# Patient Record
Sex: Female | Born: 1945
Health system: Southern US, Community
[De-identification: ages and names within clinical notes are randomized; demographics above are authoritative.]

## PROBLEM LIST (undated history)

## (undated) DIAGNOSIS — E785 Hyperlipidemia, unspecified: Secondary | ICD-10-CM

## (undated) DIAGNOSIS — M1990 Unspecified osteoarthritis, unspecified site: Secondary | ICD-10-CM

## (undated) DIAGNOSIS — I10 Essential (primary) hypertension: Secondary | ICD-10-CM

## (undated) DIAGNOSIS — I4891 Unspecified atrial fibrillation: Secondary | ICD-10-CM

## (undated) HISTORY — DX: Hyperlipidemia, unspecified: E78.5

## (undated) HISTORY — DX: Unspecified osteoarthritis, unspecified site: M19.90

## (undated) HISTORY — DX: Unspecified atrial fibrillation: I48.91

## (undated) HISTORY — DX: Essential (primary) hypertension: I10

## (undated) HISTORY — PX: GALLBLADDER SURGERY: SHX652

---

## 1977-11-02 HISTORY — PX: BUNIONECTOMY: SHX129

## 1999-11-03 HISTORY — PX: ABDOMINAL HYSTERECTOMY: SHX81

## 2008-11-01 ENCOUNTER — Encounter: Admission: RE | Admit: 2008-11-01 | Discharge: 2008-11-01 | Payer: Self-pay | Admitting: Orthopedic Surgery

## 2008-11-02 HISTORY — PX: SPINE SURGERY: SHX786

## 2009-02-21 ENCOUNTER — Inpatient Hospital Stay (HOSPITAL_COMMUNITY): Admission: RE | Admit: 2009-02-21 | Discharge: 2009-02-23 | Payer: Self-pay | Admitting: Orthopedic Surgery

## 2010-01-06 ENCOUNTER — Encounter: Admission: RE | Admit: 2010-01-06 | Discharge: 2010-01-06 | Payer: Self-pay | Admitting: Neurosurgery

## 2010-11-23 ENCOUNTER — Encounter: Payer: Self-pay | Admitting: Orthopedic Surgery

## 2010-11-23 ENCOUNTER — Encounter: Payer: Self-pay | Admitting: Neurosurgery

## 2011-02-11 LAB — CBC
HCT: 35.7 % — ABNORMAL LOW (ref 36.0–46.0)
Hemoglobin: 12.1 g/dL (ref 12.0–15.0)
MCHC: 33.9 g/dL (ref 30.0–36.0)
Platelets: 215 10*3/uL (ref 150–400)
RDW: 15.4 % (ref 11.5–15.5)

## 2011-02-11 LAB — GLUCOSE, CAPILLARY
Glucose-Capillary: 146 mg/dL — ABNORMAL HIGH (ref 70–99)
Glucose-Capillary: 154 mg/dL — ABNORMAL HIGH (ref 70–99)
Glucose-Capillary: 155 mg/dL — ABNORMAL HIGH (ref 70–99)
Glucose-Capillary: 171 mg/dL — ABNORMAL HIGH (ref 70–99)
Glucose-Capillary: 177 mg/dL — ABNORMAL HIGH (ref 70–99)
Glucose-Capillary: 189 mg/dL — ABNORMAL HIGH (ref 70–99)

## 2011-02-11 LAB — TYPE AND SCREEN: Antibody Screen: NEGATIVE

## 2011-02-11 LAB — COMPREHENSIVE METABOLIC PANEL
ALT: 16 U/L (ref 0–35)
AST: 18 U/L (ref 0–37)
Albumin: 3.5 g/dL (ref 3.5–5.2)
CO2: 33 mEq/L — ABNORMAL HIGH (ref 19–32)
Calcium: 9 mg/dL (ref 8.4–10.5)
Chloride: 101 mEq/L (ref 96–112)
GFR calc non Af Amer: >60 mL/min (ref >60)
Sodium: 139 mEq/L (ref 135–145)
Total Protein: 6.4 g/dL (ref 6.0–8.3)

## 2011-02-11 LAB — DIFFERENTIAL
Basophils Relative: 0 % (ref 0–1)
Monocytes Absolute: 0.8 10*3/uL (ref 0.1–1.0)
Monocytes Relative: 6 % (ref 3–12)

## 2011-02-11 LAB — URINALYSIS, ROUTINE W REFLEX MICROSCOPIC
Nitrite: NEGATIVE
Specific Gravity, Urine: 1.016 (ref 1.005–1.030)
Urobilinogen, UA: 1 mg/dL (ref 0.0–1.0)
pH: 5.5 (ref 5.0–8.0)

## 2011-03-17 NOTE — Op Note (Signed)
NAMESINCLAIR, ARRAZOLA             ACCOUNT NO.:  [id number]   MEDICAL RECORD NO.:  [id number]          PATIENT TYPE:  INP   LOCATION:  [place]                         FACILITY:  [facility]   PHYSICIAN:  Kipp Brood. Gioffre, M.D.DATE OF BIRTH:  07-26-1946   DATE OF PROCEDURE:  02/21/2009  DATE OF DISCHARGE:                               OPERATIVE REPORT   SURGEON:  Kipp Brood. Gladstone Lighter, M.D.   ASSISTANT:  Tarri Glenn, M.D.   PREOPERATIVE DIAGNOSES:  1. Spinal stenosis at L3-4.  2. Spinal stenosis at 4-5.  3. Spinal stenosis at L5-S1, more toward the left.  All her symptoms      were on the left.   OPERATION:  Decompressive lumbar laminectomies at three levels, at L3-4,  L4-5, L5-S1 with foraminotomies.   DESCRIPTION OF PROCEDURE:  Under general anesthesia a routine orthopedic  prep and drape of the back was carried out.  The patient was on a spinal  frame.  Two needles were placed in the back for localization purposes.  X-ray was taken.  Once we localized the area where we wanted to begin  our incision, the incision was made and extended proximally and  distally.  We then stripped the muscle from the lamina and spinous  process in the usual fashion.  Another x-ray was taken to verify our  position.  Following that, we then inserted the [facility] retractors  and we stripped the muscle from the lamina, cleaned the lamina and  identified our spinous processes and a third x-ray was taken.  We then  removed a portion of the spinous process of  L3.  I removed a small  portion of the spinous process of L3.  We then went down to L4 and L5  and removed the spinous processes.  We then went down and brought the  microscope in and completed our laminectomies.  We went up as far  proximally as necessary at L3-4 until we could easily pass a hockey-  stick proximally and distally, then our decompression was halted at that  area.  We then removed the ligamentum of flavum and exposed the dura.  We had  good freedom of motion to the dura.  We then advanced distally  with a microscope in place and thoroughly decompressed L4-5.  We freed  up the roots bilaterally.  She had foraminal stenosis as well as severe  stenosis in that area.  Once this was done, we advanced distally and we  went down and mainly concentrated on the left side for the L5-S1  laminectomy.  We did remove some of the lamina as well on the opposite  right side, until we had good freedom of the dura.  Note that each step  of the way we did utilize our hockey-stick to check the foramina, as  well as proximally and distally and made sure we had a good  decompression before we stopped in both directions.  I thoroughly  irrigated out the area.  We bone waxed some of the raw ends of the bone.  Thoroughly irrigated out the area, and we did take another x-ray with  the instruments in the various spaces.  We then inserted 10 mL of  FloSeal and then held a saline-soaked sponge over that for about 30  seconds.  Following that we loosely applied some thrombin-soaked Gelfoam  out into the lateral gutters.  We then closed the wound in layers in the  usual fashion with #1 Vicryl.  I did leave a small distal and  proximal deep portions of the wound open for drainage purposes, to avoid  any compression on the dura.  The subcu was closed with #0 Vicryl, skin  was closed with metal staples.  A sterile Neosporin dressing was  applied.  She had 1 gram of IV Ancef preoperatively.           ______________________________  Kipp Brood Gladstone Lighter, M.D.     RAG/MEDQ  D:  02/21/2009  T:  02/21/2009  Job:  [id number]   cc:   Tarri Glenn, M.D.  Fax: [phone]

## 2011-07-29 ENCOUNTER — Encounter (INDEPENDENT_AMBULATORY_CARE_PROVIDER_SITE_OTHER): Payer: Self-pay | Admitting: Surgery

## 2011-07-29 ENCOUNTER — Ambulatory Visit (INDEPENDENT_AMBULATORY_CARE_PROVIDER_SITE_OTHER): Payer: BC Managed Care – PPO | Admitting: Surgery

## 2011-07-29 VITALS — BP 128/74 | HR 72 | Temp 96.9°F | Resp 16 | Ht 67.0 in | Wt 225.5 lb

## 2011-07-29 DIAGNOSIS — D170 Benign lipomatous neoplasm of skin and subcutaneous tissue of head, face and neck: Secondary | ICD-10-CM

## 2011-07-29 DIAGNOSIS — D1739 Benign lipomatous neoplasm of skin and subcutaneous tissue of other sites: Secondary | ICD-10-CM

## 2011-07-29 NOTE — Patient Instructions (Signed)
Follow up if it increases in size or causes pain

## 2011-07-29 NOTE — Progress Notes (Signed)
Chief Complaint  Patient presents with  . Other    new pt eval of large lipoma on neck     HPI Tina Baker is a 65 y.o. female.   HPI Pt sent today at the request of Dr Delilah Shan due to a mass over the posterior left neck.  It has been present for many years.  It does not cause any pain.  No associated drainage or color change.  Past Medical History  Diagnosis Date  . Arthritis   . Diabetes mellitus   . Hyperlipidemia   . Hypertension     Past Surgical History  Procedure Date  . Abdominal hysterectomy 2001  . Spine surgery 2010    protruding disc     Family History  Problem Relation Age of Onset  . Cancer Mother 1    leukemia   . Heart disease Father 3    heart attack     Social History History  Substance Use Topics  . Smoking status: Never Smoker   . Smokeless tobacco: Never Used  . Alcohol Use: No    Allergies  Allergen Reactions  . Bextra (Valdecoxib)     Rash     Current Outpatient Prescriptions  Medication Sig Dispense Refill  . ALPRAZolam (XANAX) 0.5 MG tablet Take 0.5 mg by mouth at bedtime as needed.        Marland Kitchen aspirin 81 MG tablet Take 81 mg by mouth daily.        . clidinium-chlordiazePOXIDE (LIBRAX) 2.5-5 MG per capsule Take 1 capsule by mouth 3 (three) times daily as needed.        . Flaxseed, Linseed, (FLAXSEED OIL) 1000 MG CAPS Take by mouth daily.        . lansoprazole (PREVACID) 30 MG capsule Take 30 mg by mouth daily.        . metFORMIN (GLUCOPHAGE) 500 MG tablet Take 500 mg by mouth 2 (two) times daily with a meal.        . Multiple Vitamin (MULTIVITAMIN PO) Take by mouth daily.        Marland Kitchen olmesartan-hydrochlorothiazide (BENICAR HCT) 40-25 MG per tablet Take 1 tablet by mouth daily.        . saxagliptin HCl (ONGLYZA) 5 MG TABS tablet Take by mouth daily.        . sertraline (ZOLOFT) 50 MG tablet Take 50 mg by mouth daily.        . simvastatin (ZOCOR) 40 MG tablet Take 40 mg by mouth at bedtime.          Review of Systems Review of  Systems  Constitutional: Negative.   HENT: Negative.   Eyes: Negative.   Respiratory: Negative.   Cardiovascular: Negative.   Genitourinary: Negative.   Musculoskeletal: Positive for arthralgias.  Psychiatric/Behavioral: Negative.     Blood pressure 128/74, pulse 72, temperature 96.9 F (36.1 C), resp. rate 16, height _0  (1.702 m), weight 225 lb 8 oz (102.286 kg).  Physical Exam Physical Exam  Constitutional: She is oriented to person, place, and time. She appears well-developed and well-nourished.  HENT:  Head: Normocephalic and atraumatic.  Eyes: EOM are normal. Pupils are equal, round, and reactive to light.  Neck: Normal range of motion. Neck supple.       4 cm mass posterior left neck at skull base mobile and nontender lipoma  Neurological: She is alert and oriented to person, place, and time.  Skin: Skin is warm and dry.  Scalp sebaceous cyst x 2    Data Reviewed Note from 07/20/2101  Assessment    Lipoma posterior left neck asymptomatic    Plan    Observe       Tali Cleaves A. 07/29/2011, 4:59 PM

## 2011-12-17 ENCOUNTER — Encounter (INDEPENDENT_AMBULATORY_CARE_PROVIDER_SITE_OTHER): Payer: Self-pay | Admitting: Surgery

## 2012-02-23 ENCOUNTER — Other Ambulatory Visit: Payer: Self-pay | Admitting: Sports Medicine

## 2012-02-23 DIAGNOSIS — M545 Low back pain: Secondary | ICD-10-CM

## 2012-02-26 ENCOUNTER — Other Ambulatory Visit: Payer: Self-pay | Admitting: Sports Medicine

## 2012-02-26 DIAGNOSIS — M545 Low back pain: Secondary | ICD-10-CM

## 2012-03-01 ENCOUNTER — Ambulatory Visit
Admission: RE | Admit: 2012-03-01 | Discharge: 2012-03-01 | Disposition: A | Discharge status: Home / Self Care | Payer: BC Managed Care – PPO | Source: Ambulatory Visit | Attending: Sports Medicine | Admitting: Sports Medicine

## 2012-03-01 ENCOUNTER — Other Ambulatory Visit: Payer: Self-pay

## 2012-03-01 DIAGNOSIS — M545 Low back pain: Secondary | ICD-10-CM

## 2012-03-01 MED ORDER — GADOBENATE DIMEGLUMINE 529 MG/ML IV SOLN
20.0000 mL | Freq: Once | INTRAVENOUS | Status: AC | PRN
  Administered 2012-03-01: 20 mL via INTRAVENOUS

## 2012-11-03 DIAGNOSIS — M4316 Spondylolisthesis, lumbar region: Secondary | ICD-10-CM | POA: Insufficient documentation

## 2014-12-11 ENCOUNTER — Other Ambulatory Visit: Payer: Self-pay | Admitting: *Deleted

## 2014-12-11 DIAGNOSIS — I872 Venous insufficiency (chronic) (peripheral): Secondary | ICD-10-CM

## 2015-01-08 ENCOUNTER — Encounter: Payer: Self-pay | Admitting: Vascular Surgery

## 2015-01-09 ENCOUNTER — Encounter: Payer: Self-pay | Admitting: Vascular Surgery

## 2015-01-09 ENCOUNTER — Ambulatory Visit (HOSPITAL_COMMUNITY)
Admission: RE | Admit: 2015-01-09 | Discharge: 2015-01-09 | Disposition: A | Discharge status: Home / Self Care | Payer: Medicare HMO | Source: Ambulatory Visit | Attending: Vascular Surgery | Admitting: Vascular Surgery

## 2015-01-09 ENCOUNTER — Ambulatory Visit (INDEPENDENT_AMBULATORY_CARE_PROVIDER_SITE_OTHER): Payer: Medicare HMO | Admitting: Vascular Surgery

## 2015-01-09 VITALS — BP 132/59 | HR 76 | Temp 98.1°F | Resp 16 | Ht 66.5 in | Wt 219.0 lb

## 2015-01-09 DIAGNOSIS — I83893 Varicose veins of bilateral lower extremities with other complications: Secondary | ICD-10-CM

## 2015-01-09 DIAGNOSIS — I872 Venous insufficiency (chronic) (peripheral): Secondary | ICD-10-CM

## 2015-01-09 NOTE — Progress Notes (Signed)
[facility] [place]  Patient name: Tina Baker MRN: [id number] DOB: 21-Aug-1946 Sex: female  REASON FOR CONSULT: Chronic venous insufficiency. Referred by Dr.McKinney  HPI: Tina Baker is a 69 y.o. female who was referred with chronic venous insufficiency. She describes aching pain in her legs however this generally occurs before she wakes up in the morning and is not necessarily aggravated by standing or relieved with elevation. She also states that she has problems with restless leg syndrome and and she has some leg pain associated with this. I do not get any history of claudication or rest pain. She has no history of nonhealing ulcers.  She is unaware of any history of DVT or phlebitis in the past.  I do have some old studies back in April 2013 which showed triphasic Doppler signals in the dorsalis pedis and posterior tibial positions bilaterally with normal ABIs bilaterally.   Past Medical History  Diagnosis Date  . Arthritis   . Diabetes mellitus   . Hyperlipidemia   . Hypertension    Family History  Problem Relation Age of Onset  . Cancer Mother 63    leukemia   . Heart disease Father 76    heart attack   . Heart disease Sister    SOCIAL HISTORY: History  Substance Use Topics  . Smoking status: Never Smoker   . Smokeless tobacco: Never Used  . Alcohol Use: No   Allergies  Allergen Reactions  . Bextra [Valdecoxib] Rash    Rash    Current Outpatient Prescriptions  Medication Sig Dispense Refill  . ALPRAZolam (XANAX) 0.5 MG tablet Take 0.5 mg by mouth at bedtime as needed.      Marland Kitchen aspirin 81 MG tablet Take 81 mg by mouth daily.      . clidinium-chlordiazePOXIDE (LIBRAX) 2.5-5 MG per capsule Take 1 capsule by mouth 3 (three) times daily as needed.      Marland Kitchen glimepiride (AMARYL) 2 MG tablet Take 2 mg by mouth daily with breakfast.    . indapamide (LOZOL) 2.5 MG tablet Take 2.5 mg by mouth daily.    . lansoprazole (PREVACID) 30 MG capsule  Take 30 mg by mouth daily.      Marland Kitchen losartan (COZAAR) 100 MG tablet Take 100 mg by mouth daily.    . metFORMIN (GLUCOPHAGE) 500 MG tablet Take 500 mg by mouth 2 (two) times daily with a meal.      . pravastatin (PRAVACHOL) 40 MG tablet Take 40 mg by mouth daily.    Marland Kitchen rOPINIRole (REQUIP) 0.5 MG tablet Take 0.5 mg by mouth 3 (three) times daily.    . saxagliptin HCl (ONGLYZA) 5 MG TABS tablet Take by mouth daily.      . sertraline (ZOLOFT) 50 MG tablet Take 100 mg by mouth daily.     . Flaxseed, Linseed, (FLAXSEED OIL) 1000 MG CAPS Take by mouth daily.      . Multiple Vitamin (MULTIVITAMIN PO) Take by mouth daily.      Marland Kitchen olmesartan-hydrochlorothiazide (BENICAR HCT) 40-25 MG per tablet Take 1 tablet by mouth daily.      . simvastatin (ZOCOR) 40 MG tablet Take 40 mg by mouth at bedtime.       No current facility-administered medications for this visit.   REVIEW OF SYSTEMS: Valu.Nieves ] denotes positive finding; [  ] denotes negative finding  CARDIOVASCULAR:  _0  chest pain   _1  chest pressure   _2  palpitations   _3   orthopnea   _0  dyspnea on exertion   _1  claudication   _2  rest pain   _3  DVT   _4  phlebitis PULMONARY:   _5  productive cough   _6  asthma   _7  wheezing NEUROLOGIC:   Valu.Nieves ] weakness  _8  paresthesias  _9  aphasia  _10  amaurosis  _11  dizziness HEMATOLOGIC:   _12  bleeding problems   _13  clotting disorders MUSCULOSKELETAL:  _14  joint pain   _15  joint swelling _16  leg swelling GASTROINTESTINAL: _17   blood in stool  _18   hematemesis GENITOURINARY:  _19   dysuria  _20   hematuria PSYCHIATRIC:  _21  history of major depression INTEGUMENTARY:  _22  rashes  _23  ulcers CONSTITUTIONAL:  _24  fever   _25  chills  PHYSICAL EXAM: Filed Vitals:   01/09/15 1303  BP: 132/59  Pulse: 76  Temp: 98.1 F (36.7 C)  TempSrc: Oral  Resp: 16  Height: 5' 6.5" (1.689 m)  Weight: 219 lb (99.338 kg)  SpO2: 100%   Body mass index is 34.82 kg/(m^2). GENERAL: The patient is a well-nourished female, in no  acute distress. The vital signs are documented above. CARDIOVASCULAR: There is a regular rate and rhythm. I do not detect carotid bruits. She has palpable popliteal, dorsalis pedis, and posterior tibial pulses bilaterally. She has mild bilateral lower extremity swelling. PULMONARY: There is good air exchange bilaterally without wheezing or rales. ABDOMEN: Soft and non-tender with normal pitched bowel sounds.  MUSCULOSKELETAL: There are no major deformities or cyanosis. NEUROLOGIC: No focal weakness or paresthesias are detected. SKIN: She has some large truncal varicosities along the posterior aspect of her distal left thigh. She has telangiectasias in the thighs and lower legs bilaterally both anteriorly and posteriorly. PSYCHIATRIC: The patient has a normal affect.  DATA:  I have independently interpreted her venous duplex scan. On the left side, there is no evidence of DVT. There is no deep vein reflux there is significant reflux in the left greater saphenous vein.  On the right side, there is no evidence of DVT. There is no deep vein reflux on the right. There is only a short segment of reflux in the small saphenous vein.  MEDICAL ISSUES: BILATERAL LOWER EXTREMITY VARICOSE VEINS AND CHRONIC VENOUS INSUFFICIENCY: I am not convinced that her aching pain in her legs before she gets out of bed in the morning is related to her chronic venous insufficiency. I think this could potentially be related to her restless leg syndrome. I reassured her that she had no evidence of arterial insufficiency and her symptoms were not consistent with this. She has palpable pedal pulses. Does have significant reflux in the left greater saphenous vein however her symptoms occur when she is in bed in the morning and so I'm not sure that this is the culprit. With respect to her venous disease, I have discussed the importance of intermittent leg elevation in the proper positioning for this. In addition I have written her  prescription for knee-high compression stockings with a gradient of 15-20 mmHg. I've encouraged her to exercise as much as possible especially walking. I have encouraged her to avoid prolonged sitting and standing. We also discussed the potential use of water aerobics which I think is also helpful for patients with venous disease. If her symptoms progress then we could try her in thigh-high stockings with a gradient of 20-30. I'll be happy to see  her back at any time if her symptoms progress or she develops worsening varicose veins.  [facility] [place] Beeper: [phone]

## 2015-06-25 DIAGNOSIS — E1165 Type 2 diabetes mellitus with hyperglycemia: Secondary | ICD-10-CM | POA: Insufficient documentation

## 2015-06-25 DIAGNOSIS — E119 Type 2 diabetes mellitus without complications: Secondary | ICD-10-CM | POA: Insufficient documentation

## 2015-06-26 DIAGNOSIS — D649 Anemia, unspecified: Secondary | ICD-10-CM | POA: Insufficient documentation

## 2015-06-27 DIAGNOSIS — D72829 Elevated white blood cell count, unspecified: Secondary | ICD-10-CM | POA: Insufficient documentation

## 2015-07-04 DIAGNOSIS — Z09 Encounter for follow-up examination after completed treatment for conditions other than malignant neoplasm: Secondary | ICD-10-CM | POA: Insufficient documentation

## 2015-07-18 DIAGNOSIS — R0789 Other chest pain: Secondary | ICD-10-CM | POA: Insufficient documentation

## 2015-09-30 DIAGNOSIS — R609 Edema, unspecified: Secondary | ICD-10-CM | POA: Insufficient documentation

## 2015-11-13 DIAGNOSIS — M48061 Spinal stenosis, lumbar region without neurogenic claudication: Secondary | ICD-10-CM | POA: Insufficient documentation

## 2015-11-13 DIAGNOSIS — J309 Allergic rhinitis, unspecified: Secondary | ICD-10-CM | POA: Insufficient documentation

## 2015-11-13 DIAGNOSIS — M5126 Other intervertebral disc displacement, lumbar region: Secondary | ICD-10-CM | POA: Insufficient documentation

## 2015-11-13 DIAGNOSIS — G2581 Restless legs syndrome: Secondary | ICD-10-CM | POA: Insufficient documentation

## 2015-11-13 DIAGNOSIS — M51379 Other intervertebral disc degeneration, lumbosacral region without mention of lumbar back pain or lower extremity pain: Secondary | ICD-10-CM | POA: Insufficient documentation

## 2015-11-13 DIAGNOSIS — E6609 Other obesity due to excess calories: Secondary | ICD-10-CM | POA: Insufficient documentation

## 2015-11-13 DIAGNOSIS — M6240 Contracture of muscle, unspecified site: Secondary | ICD-10-CM | POA: Insufficient documentation

## 2015-11-13 DIAGNOSIS — G4700 Insomnia, unspecified: Secondary | ICD-10-CM | POA: Insufficient documentation

## 2015-11-13 DIAGNOSIS — K219 Gastro-esophageal reflux disease without esophagitis: Secondary | ICD-10-CM | POA: Insufficient documentation

## 2015-11-13 DIAGNOSIS — E559 Vitamin D deficiency, unspecified: Secondary | ICD-10-CM | POA: Insufficient documentation

## 2015-11-13 DIAGNOSIS — I10 Essential (primary) hypertension: Secondary | ICD-10-CM | POA: Insufficient documentation

## 2015-11-13 DIAGNOSIS — M7580 Other shoulder lesions, unspecified shoulder: Secondary | ICD-10-CM | POA: Insufficient documentation

## 2015-11-13 DIAGNOSIS — G44229 Chronic tension-type headache, not intractable: Secondary | ICD-10-CM | POA: Insufficient documentation

## 2015-11-13 DIAGNOSIS — M109 Gout, unspecified: Secondary | ICD-10-CM | POA: Insufficient documentation

## 2015-11-13 DIAGNOSIS — M5137 Other intervertebral disc degeneration, lumbosacral region: Secondary | ICD-10-CM | POA: Insufficient documentation

## 2015-11-13 DIAGNOSIS — D500 Iron deficiency anemia secondary to blood loss (chronic): Secondary | ICD-10-CM | POA: Insufficient documentation

## 2015-11-13 DIAGNOSIS — I480 Paroxysmal atrial fibrillation: Secondary | ICD-10-CM | POA: Insufficient documentation

## 2015-11-18 DIAGNOSIS — I723 Aneurysm of iliac artery: Secondary | ICD-10-CM | POA: Diagnosis not present

## 2015-11-18 DIAGNOSIS — I724 Aneurysm of artery of lower extremity: Secondary | ICD-10-CM | POA: Diagnosis not present

## 2015-11-19 DIAGNOSIS — I10 Essential (primary) hypertension: Secondary | ICD-10-CM | POA: Diagnosis not present

## 2015-11-19 DIAGNOSIS — E119 Type 2 diabetes mellitus without complications: Secondary | ICD-10-CM | POA: Diagnosis not present

## 2015-11-20 DIAGNOSIS — H2513 Age-related nuclear cataract, bilateral: Secondary | ICD-10-CM | POA: Diagnosis not present

## 2015-11-20 DIAGNOSIS — H169 Unspecified keratitis: Secondary | ICD-10-CM | POA: Diagnosis not present

## 2015-11-20 DIAGNOSIS — I10 Essential (primary) hypertension: Secondary | ICD-10-CM | POA: Diagnosis not present

## 2015-11-20 DIAGNOSIS — E119 Type 2 diabetes mellitus without complications: Secondary | ICD-10-CM | POA: Diagnosis not present

## 2015-11-20 DIAGNOSIS — H1851 Endothelial corneal dystrophy: Secondary | ICD-10-CM | POA: Diagnosis not present

## 2015-11-26 DIAGNOSIS — L600 Ingrowing nail: Secondary | ICD-10-CM | POA: Diagnosis not present

## 2015-11-26 DIAGNOSIS — E1165 Type 2 diabetes mellitus with hyperglycemia: Secondary | ICD-10-CM | POA: Diagnosis not present

## 2015-12-02 DIAGNOSIS — I729 Aneurysm of unspecified site: Secondary | ICD-10-CM | POA: Diagnosis not present

## 2015-12-02 DIAGNOSIS — I723 Aneurysm of iliac artery: Secondary | ICD-10-CM | POA: Diagnosis not present

## 2015-12-23 DIAGNOSIS — Z1231 Encounter for screening mammogram for malignant neoplasm of breast: Secondary | ICD-10-CM | POA: Diagnosis not present

## 2015-12-31 DIAGNOSIS — M7551 Bursitis of right shoulder: Secondary | ICD-10-CM | POA: Diagnosis not present

## 2015-12-31 DIAGNOSIS — I10 Essential (primary) hypertension: Secondary | ICD-10-CM | POA: Diagnosis not present

## 2015-12-31 DIAGNOSIS — M7550 Bursitis of unspecified shoulder: Secondary | ICD-10-CM | POA: Insufficient documentation

## 2015-12-31 DIAGNOSIS — M7581 Other shoulder lesions, right shoulder: Secondary | ICD-10-CM | POA: Diagnosis not present

## 2015-12-31 DIAGNOSIS — J029 Acute pharyngitis, unspecified: Secondary | ICD-10-CM | POA: Diagnosis not present

## 2015-12-31 DIAGNOSIS — Z7901 Long term (current) use of anticoagulants: Secondary | ICD-10-CM | POA: Insufficient documentation

## 2015-12-31 DIAGNOSIS — M1990 Unspecified osteoarthritis, unspecified site: Secondary | ICD-10-CM | POA: Diagnosis not present

## 2015-12-31 DIAGNOSIS — M19019 Primary osteoarthritis, unspecified shoulder: Secondary | ICD-10-CM | POA: Insufficient documentation

## 2015-12-31 DIAGNOSIS — M25511 Pain in right shoulder: Secondary | ICD-10-CM | POA: Diagnosis not present

## 2016-01-13 DIAGNOSIS — Z1272 Encounter for screening for malignant neoplasm of vagina: Secondary | ICD-10-CM | POA: Diagnosis not present

## 2016-01-13 DIAGNOSIS — N898 Other specified noninflammatory disorders of vagina: Secondary | ICD-10-CM | POA: Diagnosis not present

## 2016-01-13 DIAGNOSIS — Z01419 Encounter for gynecological examination (general) (routine) without abnormal findings: Secondary | ICD-10-CM | POA: Diagnosis not present

## 2016-01-22 DIAGNOSIS — G2581 Restless legs syndrome: Secondary | ICD-10-CM | POA: Diagnosis not present

## 2016-01-28 DIAGNOSIS — R001 Bradycardia, unspecified: Secondary | ICD-10-CM | POA: Diagnosis not present

## 2016-01-28 DIAGNOSIS — I480 Paroxysmal atrial fibrillation: Secondary | ICD-10-CM | POA: Diagnosis not present

## 2016-01-29 DIAGNOSIS — R001 Bradycardia, unspecified: Secondary | ICD-10-CM | POA: Diagnosis not present

## 2016-01-29 DIAGNOSIS — I481 Persistent atrial fibrillation: Secondary | ICD-10-CM | POA: Diagnosis not present

## 2016-01-29 DIAGNOSIS — R42 Dizziness and giddiness: Secondary | ICD-10-CM | POA: Diagnosis not present

## 2016-01-31 DIAGNOSIS — R001 Bradycardia, unspecified: Secondary | ICD-10-CM | POA: Diagnosis not present

## 2016-01-31 DIAGNOSIS — I480 Paroxysmal atrial fibrillation: Secondary | ICD-10-CM | POA: Diagnosis not present

## 2016-02-04 DIAGNOSIS — R109 Unspecified abdominal pain: Secondary | ICD-10-CM | POA: Diagnosis not present

## 2016-02-04 DIAGNOSIS — R102 Pelvic and perineal pain: Secondary | ICD-10-CM | POA: Diagnosis not present

## 2016-02-06 DIAGNOSIS — B9629 Other Escherichia coli [E. coli] as the cause of diseases classified elsewhere: Secondary | ICD-10-CM | POA: Diagnosis not present

## 2016-02-06 DIAGNOSIS — R350 Frequency of micturition: Secondary | ICD-10-CM | POA: Diagnosis not present

## 2016-02-06 DIAGNOSIS — N3001 Acute cystitis with hematuria: Secondary | ICD-10-CM | POA: Diagnosis not present

## 2016-02-10 DIAGNOSIS — Z780 Asymptomatic menopausal state: Secondary | ICD-10-CM | POA: Diagnosis not present

## 2016-02-10 DIAGNOSIS — Z13820 Encounter for screening for osteoporosis: Secondary | ICD-10-CM | POA: Diagnosis not present

## 2016-02-10 DIAGNOSIS — Z01419 Encounter for gynecological examination (general) (routine) without abnormal findings: Secondary | ICD-10-CM | POA: Diagnosis not present

## 2016-03-17 DIAGNOSIS — Z7901 Long term (current) use of anticoagulants: Secondary | ICD-10-CM | POA: Diagnosis not present

## 2016-03-17 DIAGNOSIS — R001 Bradycardia, unspecified: Secondary | ICD-10-CM | POA: Diagnosis not present

## 2016-03-17 DIAGNOSIS — I480 Paroxysmal atrial fibrillation: Secondary | ICD-10-CM | POA: Diagnosis not present

## 2016-04-01 DIAGNOSIS — I10 Essential (primary) hypertension: Secondary | ICD-10-CM | POA: Diagnosis not present

## 2016-04-01 DIAGNOSIS — E119 Type 2 diabetes mellitus without complications: Secondary | ICD-10-CM | POA: Diagnosis not present

## 2016-04-01 DIAGNOSIS — R42 Dizziness and giddiness: Secondary | ICD-10-CM | POA: Diagnosis not present

## 2016-04-01 DIAGNOSIS — R110 Nausea: Secondary | ICD-10-CM | POA: Diagnosis not present

## 2016-04-27 DIAGNOSIS — E119 Type 2 diabetes mellitus without complications: Secondary | ICD-10-CM | POA: Diagnosis not present

## 2016-04-28 DIAGNOSIS — I10 Essential (primary) hypertension: Secondary | ICD-10-CM | POA: Diagnosis not present

## 2016-04-28 DIAGNOSIS — L821 Other seborrheic keratosis: Secondary | ICD-10-CM | POA: Diagnosis not present

## 2016-04-28 DIAGNOSIS — E119 Type 2 diabetes mellitus without complications: Secondary | ICD-10-CM | POA: Diagnosis not present

## 2016-06-24 DIAGNOSIS — J029 Acute pharyngitis, unspecified: Secondary | ICD-10-CM | POA: Diagnosis not present

## 2016-06-24 DIAGNOSIS — E119 Type 2 diabetes mellitus without complications: Secondary | ICD-10-CM | POA: Diagnosis not present

## 2016-06-24 DIAGNOSIS — H9203 Otalgia, bilateral: Secondary | ICD-10-CM | POA: Diagnosis not present

## 2016-06-24 DIAGNOSIS — J0100 Acute maxillary sinusitis, unspecified: Secondary | ICD-10-CM | POA: Diagnosis not present

## 2016-06-24 DIAGNOSIS — I10 Essential (primary) hypertension: Secondary | ICD-10-CM | POA: Diagnosis not present

## 2016-07-21 DIAGNOSIS — D1801 Hemangioma of skin and subcutaneous tissue: Secondary | ICD-10-CM | POA: Diagnosis not present

## 2016-07-21 DIAGNOSIS — L821 Other seborrheic keratosis: Secondary | ICD-10-CM | POA: Diagnosis not present

## 2016-07-21 DIAGNOSIS — D225 Melanocytic nevi of trunk: Secondary | ICD-10-CM | POA: Diagnosis not present

## 2016-08-07 DIAGNOSIS — I10 Essential (primary) hypertension: Secondary | ICD-10-CM | POA: Diagnosis not present

## 2016-08-07 DIAGNOSIS — Z0000 Encounter for general adult medical examination without abnormal findings: Secondary | ICD-10-CM | POA: Diagnosis not present

## 2016-08-07 DIAGNOSIS — E119 Type 2 diabetes mellitus without complications: Secondary | ICD-10-CM | POA: Diagnosis not present

## 2016-08-07 DIAGNOSIS — Z131 Encounter for screening for diabetes mellitus: Secondary | ICD-10-CM | POA: Diagnosis not present

## 2016-08-10 DIAGNOSIS — E162 Hypoglycemia, unspecified: Secondary | ICD-10-CM | POA: Diagnosis not present

## 2016-08-10 DIAGNOSIS — Z0001 Encounter for general adult medical examination with abnormal findings: Secondary | ICD-10-CM | POA: Diagnosis not present

## 2016-08-10 DIAGNOSIS — R635 Abnormal weight gain: Secondary | ICD-10-CM | POA: Diagnosis not present

## 2016-08-10 DIAGNOSIS — Z23 Encounter for immunization: Secondary | ICD-10-CM | POA: Diagnosis not present

## 2016-08-10 DIAGNOSIS — D72829 Elevated white blood cell count, unspecified: Secondary | ICD-10-CM | POA: Diagnosis not present

## 2016-08-10 DIAGNOSIS — S50812A Abrasion of left forearm, initial encounter: Secondary | ICD-10-CM | POA: Diagnosis not present

## 2016-09-21 DIAGNOSIS — R001 Bradycardia, unspecified: Secondary | ICD-10-CM | POA: Diagnosis not present

## 2016-09-21 DIAGNOSIS — I480 Paroxysmal atrial fibrillation: Secondary | ICD-10-CM | POA: Diagnosis not present

## 2016-10-07 DIAGNOSIS — E118 Type 2 diabetes mellitus with unspecified complications: Secondary | ICD-10-CM | POA: Diagnosis not present

## 2016-10-07 DIAGNOSIS — D649 Anemia, unspecified: Secondary | ICD-10-CM | POA: Diagnosis not present

## 2016-10-07 DIAGNOSIS — G2581 Restless legs syndrome: Secondary | ICD-10-CM | POA: Diagnosis not present

## 2016-10-07 DIAGNOSIS — I480 Paroxysmal atrial fibrillation: Secondary | ICD-10-CM | POA: Diagnosis not present

## 2016-10-07 DIAGNOSIS — D72829 Elevated white blood cell count, unspecified: Secondary | ICD-10-CM | POA: Diagnosis not present

## 2016-10-07 DIAGNOSIS — Z9889 Other specified postprocedural states: Secondary | ICD-10-CM | POA: Diagnosis not present

## 2016-10-07 DIAGNOSIS — F5104 Psychophysiologic insomnia: Secondary | ICD-10-CM | POA: Diagnosis not present

## 2016-10-07 DIAGNOSIS — E119 Type 2 diabetes mellitus without complications: Secondary | ICD-10-CM | POA: Diagnosis not present

## 2016-10-07 DIAGNOSIS — Z7901 Long term (current) use of anticoagulants: Secondary | ICD-10-CM | POA: Diagnosis not present

## 2016-10-08 DIAGNOSIS — I480 Paroxysmal atrial fibrillation: Secondary | ICD-10-CM | POA: Diagnosis not present

## 2016-11-09 DIAGNOSIS — M79671 Pain in right foot: Secondary | ICD-10-CM | POA: Diagnosis not present

## 2016-11-09 DIAGNOSIS — M79672 Pain in left foot: Secondary | ICD-10-CM | POA: Diagnosis not present

## 2016-11-09 DIAGNOSIS — L602 Onychogryphosis: Secondary | ICD-10-CM | POA: Diagnosis not present

## 2016-11-09 DIAGNOSIS — M216X1 Other acquired deformities of right foot: Secondary | ICD-10-CM | POA: Diagnosis not present

## 2016-11-09 DIAGNOSIS — E119 Type 2 diabetes mellitus without complications: Secondary | ICD-10-CM | POA: Diagnosis not present

## 2016-11-09 DIAGNOSIS — L84 Corns and callosities: Secondary | ICD-10-CM | POA: Diagnosis not present

## 2016-11-09 DIAGNOSIS — M2041 Other hammer toe(s) (acquired), right foot: Secondary | ICD-10-CM | POA: Diagnosis not present

## 2016-11-10 DIAGNOSIS — E119 Type 2 diabetes mellitus without complications: Secondary | ICD-10-CM | POA: Diagnosis not present

## 2016-11-11 DIAGNOSIS — F330 Major depressive disorder, recurrent, mild: Secondary | ICD-10-CM | POA: Diagnosis not present

## 2016-11-11 DIAGNOSIS — Z0000 Encounter for general adult medical examination without abnormal findings: Secondary | ICD-10-CM | POA: Diagnosis not present

## 2016-11-11 DIAGNOSIS — H8110 Benign paroxysmal vertigo, unspecified ear: Secondary | ICD-10-CM | POA: Diagnosis not present

## 2016-11-11 DIAGNOSIS — I480 Paroxysmal atrial fibrillation: Secondary | ICD-10-CM | POA: Diagnosis not present

## 2016-11-11 DIAGNOSIS — G4709 Other insomnia: Secondary | ICD-10-CM | POA: Diagnosis not present

## 2016-11-11 DIAGNOSIS — I10 Essential (primary) hypertension: Secondary | ICD-10-CM | POA: Diagnosis not present

## 2016-11-11 DIAGNOSIS — E119 Type 2 diabetes mellitus without complications: Secondary | ICD-10-CM | POA: Diagnosis not present

## 2016-12-07 DIAGNOSIS — H1851 Endothelial corneal dystrophy: Secondary | ICD-10-CM | POA: Diagnosis not present

## 2016-12-07 DIAGNOSIS — E119 Type 2 diabetes mellitus without complications: Secondary | ICD-10-CM | POA: Diagnosis not present

## 2016-12-07 DIAGNOSIS — H2513 Age-related nuclear cataract, bilateral: Secondary | ICD-10-CM | POA: Diagnosis not present

## 2017-02-22 DIAGNOSIS — G8929 Other chronic pain: Secondary | ICD-10-CM | POA: Diagnosis not present

## 2017-02-22 DIAGNOSIS — E119 Type 2 diabetes mellitus without complications: Secondary | ICD-10-CM | POA: Diagnosis not present

## 2017-02-22 DIAGNOSIS — M545 Low back pain: Secondary | ICD-10-CM | POA: Diagnosis not present

## 2017-02-22 DIAGNOSIS — R600 Localized edema: Secondary | ICD-10-CM | POA: Diagnosis not present

## 2017-03-11 DIAGNOSIS — M48061 Spinal stenosis, lumbar region without neurogenic claudication: Secondary | ICD-10-CM | POA: Diagnosis not present

## 2017-03-11 DIAGNOSIS — M4316 Spondylolisthesis, lumbar region: Secondary | ICD-10-CM | POA: Diagnosis not present

## 2017-03-11 DIAGNOSIS — E119 Type 2 diabetes mellitus without complications: Secondary | ICD-10-CM | POA: Diagnosis not present

## 2017-03-11 DIAGNOSIS — I10 Essential (primary) hypertension: Secondary | ICD-10-CM | POA: Diagnosis not present

## 2017-03-11 DIAGNOSIS — R600 Localized edema: Secondary | ICD-10-CM | POA: Diagnosis not present

## 2017-03-12 DIAGNOSIS — M48061 Spinal stenosis, lumbar region without neurogenic claudication: Secondary | ICD-10-CM | POA: Diagnosis not present

## 2017-03-12 DIAGNOSIS — M545 Low back pain: Secondary | ICD-10-CM | POA: Diagnosis not present

## 2017-03-22 DIAGNOSIS — I480 Paroxysmal atrial fibrillation: Secondary | ICD-10-CM | POA: Diagnosis not present

## 2017-03-22 DIAGNOSIS — R001 Bradycardia, unspecified: Secondary | ICD-10-CM | POA: Diagnosis not present

## 2017-03-22 DIAGNOSIS — Z7901 Long term (current) use of anticoagulants: Secondary | ICD-10-CM | POA: Diagnosis not present

## 2017-03-30 DIAGNOSIS — M4316 Spondylolisthesis, lumbar region: Secondary | ICD-10-CM | POA: Diagnosis not present

## 2017-03-30 DIAGNOSIS — M5440 Lumbago with sciatica, unspecified side: Secondary | ICD-10-CM | POA: Diagnosis not present

## 2017-04-26 DIAGNOSIS — H43811 Vitreous degeneration, right eye: Secondary | ICD-10-CM | POA: Diagnosis not present

## 2017-05-06 DIAGNOSIS — R10816 Epigastric abdominal tenderness: Secondary | ICD-10-CM | POA: Diagnosis not present

## 2017-05-06 DIAGNOSIS — K219 Gastro-esophageal reflux disease without esophagitis: Secondary | ICD-10-CM | POA: Diagnosis not present

## 2017-05-06 DIAGNOSIS — F330 Major depressive disorder, recurrent, mild: Secondary | ICD-10-CM | POA: Diagnosis not present

## 2017-05-06 DIAGNOSIS — R609 Edema, unspecified: Secondary | ICD-10-CM | POA: Diagnosis not present

## 2017-05-14 DIAGNOSIS — H43811 Vitreous degeneration, right eye: Secondary | ICD-10-CM | POA: Diagnosis not present

## 2017-05-19 ENCOUNTER — Ambulatory Visit (INDEPENDENT_AMBULATORY_CARE_PROVIDER_SITE_OTHER): Payer: PPO | Admitting: Physical Medicine and Rehabilitation

## 2017-05-19 ENCOUNTER — Ambulatory Visit (INDEPENDENT_AMBULATORY_CARE_PROVIDER_SITE_OTHER): Payer: PPO

## 2017-05-19 ENCOUNTER — Encounter (INDEPENDENT_AMBULATORY_CARE_PROVIDER_SITE_OTHER): Payer: Self-pay | Admitting: Physical Medicine and Rehabilitation

## 2017-05-19 VITALS — BP 165/77 | HR 93 | Temp 97.5°F

## 2017-05-19 DIAGNOSIS — M48062 Spinal stenosis, lumbar region with neurogenic claudication: Secondary | ICD-10-CM | POA: Diagnosis not present

## 2017-05-19 DIAGNOSIS — M5416 Radiculopathy, lumbar region: Secondary | ICD-10-CM

## 2017-05-19 DIAGNOSIS — M961 Postlaminectomy syndrome, not elsewhere classified: Secondary | ICD-10-CM

## 2017-05-19 MED ORDER — LIDOCAINE HCL (PF) 1 % IJ SOLN
2.0000 mL | Freq: Once | INTRAMUSCULAR | Status: AC
  Administered 2017-05-19: 2 mL

## 2017-05-19 MED ORDER — METHYLPREDNISOLONE ACETATE 80 MG/ML IJ SUSP
80.0000 mg | Freq: Once | INTRAMUSCULAR | Status: AC
  Administered 2017-05-19: 80 mg

## 2017-05-19 NOTE — Progress Notes (Deleted)
Lower back pain for years. Had surgery in 2010. Increased pain x 2 months. Pain across lower back and into buttock. Left side is worse. Radiates down outside of left leg to mid thigh. Denies numbness and tingling. Occasional weakness in both legs.

## 2017-05-19 NOTE — Patient Instructions (Signed)

## 2017-05-21 DIAGNOSIS — H43811 Vitreous degeneration, right eye: Secondary | ICD-10-CM | POA: Diagnosis not present

## 2017-05-21 NOTE — Procedures (Signed)
Lumbosacral Transforaminal Epidural Steroid Injection - Infraneural Approach with Fluoroscopic Guidance  Patient: Tina Baker      Date of Birth: 1946-07-01 MRN: [id number] PCP: Dyann Ruddle, MD      Visit Date: 05/19/2017   Universal Protocol:     Consent Given By: the patient  Position: PRONE   Additional Comments: Vital signs were monitored before and after the procedure. Patient was prepped and draped in the usual sterile fashion. The correct patient, procedure, and site was verified.   Injection Procedure Details:  Procedure Site One Meds Administered:  Meds ordered this encounter  Medications  . lidocaine (PF) (XYLOCAINE) 1 % injection 2 mL  . methylPREDNISolone acetate (DEPO-MEDROL) injection 80 mg      Laterality: Bilateral  Location/Site:  L4-L5  Needle size: 22 G  Needle type: Spinal  Needle Placement: Transforaminal  Findings:  -Contrast Used: 0.5 mL iohexol 180 mg iodine/mL   -Comments: Excellent flow of contrast along the nerve and into the epidural space.  Procedure Details: After squaring off the end-plates of the desired vertebral level to get a true AP view, the C-arm was obliqued to the painful side so that the superior articulating process is positioned about 1/3 the length of the inferior endplate.  The needle was aimed toward the junction of the superior articular process and the transverse process of the inferior vertebrae. The needle's initial entry is in the lower third of the foramen through Kambin's triangle. The soft tissues overlying this target were infiltrated with 2-3 ml. of 1% Lidocaine without Epinephrine.  The spinal needle was then inserted and advanced toward the target using a "trajectory" view along the fluoroscope beam.  Under AP and lateral visualization, the needle was advanced so it did not puncture dura and did not traverse medially beyond the 6 o'clock position of the pedicle. Bi-planar projections were used to confirm  position. Aspiration was confirmed to be negative for CSF and/or blood. A 1-2 ml. volume of Isovue-250 was injected and flow of contrast was noted at each level. Radiographs were obtained for documentation purposes.   After attaining the desired flow of contrast documented above, a 0.5 to 1.0 ml test dose of 0.25% Marcaine was injected into each respective transforaminal space.  The patient was observed for 90 seconds post injection.  After no sensory deficits were reported, and normal lower extremity motor function was noted,   the above injectate was administered so that equal amounts of the injectate were placed at each foramen (level) into the transforaminal epidural space.   Additional Comments:  The patient tolerated the procedure well Dressing: Band-Aid    Post-procedure details: Patient was observed during the procedure. Post-procedure instructions were reviewed.  Patient left the clinic in stable condition.

## 2017-05-21 NOTE — Progress Notes (Signed)
Tina Baker - 71 y.o. female MRN [id number]  Date of birth: 22-Nov-1945  Office Visit Note: Visit Date: 05/19/2017 PCP: Dyann Ruddle, MD Referred by: Dyann Ruddle, MD  Subjective: Chief Complaint  Patient presents with  . Lower Back - Pain   HPI: [name] is a very pleasant 71 year old female who was kindly sent here by Dr. Saintclair Halsted for epidural injection diagnostically and hopefully therapeutically. Briefly, [name] has experienced lower back pain for many years. She had lumbar surgery by Dr. Gladstone Lighter in 2010 TL try to resolve her low back and left radicular type pain that she was having. She reports that she was better after surgery but still never really got to the point where she had no pain in the left hip and leg. She has pain mainly in the buttock and left side of the leg in L5 distribution but she denies numbness and tingling. She feels some weakness in the legs but no foot drop.  She has some of her care at [facility] and a recent MRI is reviewed below. Dr. Saintclair Halsted requested at least on the notes that I received a bilateral L3-4 and L4-5 interlaminar epidural injection. Unfortunately the patient has had surgery for sure at the L4-5 level and by her report she was told she had 3 levels of surgery done in 2010. Because her level of stenosis is worse at L4-5 compared to the other levels that she is having left radicular pain most consistent with an L5 pattern and due to the potential levels of surgery and ligamentum flavum disruption as well as the fact that she is not standard to complete 2 level intralaminar injections we elected to complete bilateral L4 transforaminal epidural steroid injections.     ROS Otherwise per HPI.  Assessment & Plan: Visit Diagnoses:  1. Lumbar radiculopathy   2. Spinal stenosis of lumbar region with neurogenic claudication   3. Post laminectomy syndrome     Plan: Findings:  Bilateral L4 transforaminal epidural steroid injection. Depending  on her relief would consider an L3-4 intralaminar injections appears to be ligamentum flavum present at this level. She'll follow-up with Dr.Cram    Meds & Orders:  Meds ordered this encounter  Medications  . lidocaine (PF) (XYLOCAINE) 1 % injection 2 mL  . methylPREDNISolone acetate (DEPO-MEDROL) injection 80 mg    Orders Placed This Encounter  Procedures  . XR C-ARM NO REPORT  . Epidural Steroid injection    Follow-up: Return for Dr. Saintclair Halsted.   Procedures: No procedures performed  Lumbosacral Transforaminal Epidural Steroid Injection - Infraneural Approach with Fluoroscopic Guidance  Patient: Tina Baker      Date of Birth: Nov 16, 1945 MRN: [id number] PCP: Dyann Ruddle, MD      Visit Date: 05/19/2017   Universal Protocol:     Consent Given By: the patient  Position: PRONE   Additional Comments: Vital signs were monitored before and after the procedure. Patient was prepped and draped in the usual sterile fashion. The correct patient, procedure, and site was verified.   Injection Procedure Details:  Procedure Site One Meds Administered:  Meds ordered this encounter  Medications  . lidocaine (PF) (XYLOCAINE) 1 % injection 2 mL  . methylPREDNISolone acetate (DEPO-MEDROL) injection 80 mg      Laterality: Bilateral  Location/Site:  L4-L5  Needle size: 22 G  Needle type: Spinal  Needle Placement: Transforaminal  Findings:  -Contrast Used: 0.5 mL iohexol 180 mg iodine/mL   -Comments: Excellent flow of contrast along  the nerve and into the epidural space.  Procedure Details: After squaring off the end-plates of the desired vertebral level to get a true AP view, the C-arm was obliqued to the painful side so that the superior articulating process is positioned about 1/3 the length of the inferior endplate.  The needle was aimed toward the junction of the superior articular process and the transverse process of the inferior vertebrae. The needle's initial entry  is in the lower third of the foramen through Kambin's triangle. The soft tissues overlying this target were infiltrated with 2-3 ml. of 1% Lidocaine without Epinephrine.  The spinal needle was then inserted and advanced toward the target using a "trajectory" view along the fluoroscope beam.  Under AP and lateral visualization, the needle was advanced so it did not puncture dura and did not traverse medially beyond the 6 o'clock position of the pedicle. Bi-planar projections were used to confirm position. Aspiration was confirmed to be negative for CSF and/or blood. A 1-2 ml. volume of Isovue-250 was injected and flow of contrast was noted at each level. Radiographs were obtained for documentation purposes.   After attaining the desired flow of contrast documented above, a 0.5 to 1.0 ml test dose of 0.25% Marcaine was injected into each respective transforaminal space.  The patient was observed for 90 seconds post injection.  After no sensory deficits were reported, and normal lower extremity motor function was noted,   the above injectate was administered so that equal amounts of the injectate were placed at each foramen (level) into the transforaminal epidural space.   Additional Comments:  The patient tolerated the procedure well Dressing: Band-Aid    Post-procedure details: Patient was observed during the procedure. Post-procedure instructions were reviewed.  Patient left the clinic in stable condition.   Clinical History: MRI LUMBAR SPINE WITHOUT CONTRAST 03/12/2017   COMPARISON: 03/01/2012  FINDINGS: Segmentation: Standard.  Alignment: Chronic grade 1 anterolisthesis at L4-5, facet mediated.  Vertebrae: Interval but chronic Schmorl's nodes at L1, L3, and L4. No acute fracture, discitis, or aggressive bone lesion.  Conus medullaris: Extends to the L1-2 disc level and appears normal.  Paraspinal and other soft tissues: Negative  Disc levels:  T12- L1: Unremarkable.  L1-L2:  Progressed disc narrowing with minor annulus bulging. Negative facets. No impingement  L2-L3: Progressed disc narrowing with minimal annulus bulging. Minor facet spurring. No impingement  L3-L4: Chronic disc height loss and central broad disc protrusion. Facet hypertrophy and ligament thickening that is mildly progressed. Progressed bilateral subarticular recess stenosis without static L4 compression. Patent foramina  L4-L5: Advanced facet arthropathy with chronic anterolisthesis. Progressed degenerative disc narrowing with mild bulging and a central disc protrusion. The disc protrusion has regressed since prior. Spinal stenosis is improved but moderate. Bilateral subarticular recess impingement is improved. Foraminal distortion and narrowing without L4 compression.  L5-S1:Advanced degenerative disc narrowing with endplate ridging. Bilateral facet spurring. No impingement  IMPRESSION: 1. L4-5 moderate spinal and bilateral subarticular recess stenosis that is improved compared to 2013 due to regressed disc protrusion. 2. Elsewhere, there has been generalized mild progression of spinal degeneration when compared to 2013. 3. L3-4 mild bilateral subarticular recess stenosis.   Electronically Signed By: Monte Fantasia M.D. On: 19:12  MRI LUMBAR SPINE WITHOUT AND WITH CONTRAST  Technique: Multiplanar and multiecho pulse sequences of the lumbar spine were obtained without and with intravenous contrast.  Contrast: 67m MULTIHANCE GADOBENATE DIMEGLUMINE 529 MG/ML IV SOLN  [facility]  [place] Results: BUN 16 mg/dL, Creatinine 0.8 mg/dL.  Comparison: 11/01/2008  Findings: As on the prior exam, the lowest full intervertebral disk space is labeled L5-S1. If procedural intervention is to be performed, careful correlation with this numbering strategy is recommended.  The conus medullaris appears unremarkable.  Conus level: L1-2.  Scattered small retroperitoneal lymph nodes are not pathologically enlarged by size criteria.  Prior posterior decompression noted at L3-4 and L4-5.  There is 3 mm of degenerative posterior subluxation of at L3-4 and 4 mm of degenerative anterior subluxation of L4-5. Continued reduced intervertebral disc height that L5-S1 noted.  Low-level facet edema at the L4-5 level is attributed to degenerative facet arthropathy.  Additional findings at individual levels are as follows:  L1-2: Mild facet arthropathy and minimal bulge, no impingement.  L2-3: Mild disc bulge, no impingement.  L3-4: Mild bulge with shallow left paracentral disc protrusion noted. There is some minimal enhancement in this vicinity, and only borderline left subarticular lateral recess stenosis. No overt central stenosis.  L4-5: Considerable facet arthropathy is present bilaterally with encroachment due to spurring. Disc bulge noted with recurrent central disc protrusion extending slightly cephalad as shown on image 6 of series 8. Postcontrast images 22-23 of series 9 raise the possibility of a small right synovial cyst extending from the right facet joint. Prior posterior decompression noted with considerable epidural fibrosis posterior to the thecal sac and along the right side of the thecal sac. Overall there is moderate central stenosis along with moderate to prominent bilateral subarticular lateral recess stenosis. Facet spurring causes mild right and borderline left foraminal stenosis. Small facet effusions are present bilaterally.  L5-S1: Postoperative findings noted with mild facet arthropathy and left eccentric disc osteophyte complex. There is borderline bilateral foraminal stenosis along with mild left and borderline right subarticular lateral recess stenosis. The degree of foraminal spurring from the facet joints appears less striking than on the preoperative exam from  2009.  IMPRESSION:  1. Disc bulge and recurrent disc protrusion at L4-5 causing moderate central stenosis and moderate to prominent bilateral subarticular lateral recess stenosis. There is mild right foraminal stenosis at this level due to facet spurring. There may also be a small right synovial cyst extending caudad from the right L4-5 facet contributing to some of the posterolateral encroachment. 2. Mild left subarticular lateral recess stenosis at L5-S1 primarily due to spurring. 3. Additional spondylosis and degenerative disc disease as noted above, without additional associated impingement.  She reports that she has never smoked. She has never used smokeless tobacco. No results for input(s): HGBA1C, LABURIC in the last 8760 hours.  Objective:  VS:  HT:    WT:   BMI:     BP:(!) 165/77  HR:93bpm  TEMP:(!) 97.5 F (36.4 C)(Oral)  RESP:96 % Physical Exam  Musculoskeletal:  Patient ambulates without aid with good distal strength.    Ortho Exam Imaging: No results found.  Past Medical/Family/Surgical/Social History: Medications & Allergies reviewed per EMR Patient Active Problem List   Diagnosis Date Noted  . Chronic venous insufficiency 01/09/2015   Past Medical History:  Diagnosis Date  . Arthritis   . Diabetes mellitus   . Hyperlipidemia   . Hypertension    Family History  Problem Relation Age of Onset  . Cancer Mother 36       leukemia   . Heart disease Father 73       heart attack   . Heart disease Sister    Past Surgical History:  Procedure Laterality Date  . ABDOMINAL HYSTERECTOMY  2001  . BUNIONECTOMY Bilateral 1979  . SPINE SURGERY  2010   protruding disc    Social History   Occupational History  . Not on file.   Social History Main Topics  . Smoking status: Never Smoker  . Smokeless tobacco: Never Used  . Alcohol use No  . Drug use: No  . Sexual activity: Not on file

## 2017-05-24 DIAGNOSIS — E119 Type 2 diabetes mellitus without complications: Secondary | ICD-10-CM | POA: Diagnosis not present

## 2017-06-01 DIAGNOSIS — G2581 Restless legs syndrome: Secondary | ICD-10-CM | POA: Diagnosis not present

## 2017-06-01 DIAGNOSIS — F5104 Psychophysiologic insomnia: Secondary | ICD-10-CM | POA: Diagnosis not present

## 2017-06-02 DIAGNOSIS — I10 Essential (primary) hypertension: Secondary | ICD-10-CM | POA: Diagnosis not present

## 2017-06-02 DIAGNOSIS — E119 Type 2 diabetes mellitus without complications: Secondary | ICD-10-CM | POA: Diagnosis not present

## 2017-06-02 DIAGNOSIS — R10816 Epigastric abdominal tenderness: Secondary | ICD-10-CM | POA: Diagnosis not present

## 2017-06-02 DIAGNOSIS — K219 Gastro-esophageal reflux disease without esophagitis: Secondary | ICD-10-CM | POA: Diagnosis not present

## 2017-07-02 DIAGNOSIS — M4807 Spinal stenosis, lumbosacral region: Secondary | ICD-10-CM | POA: Diagnosis not present

## 2017-07-02 DIAGNOSIS — M48061 Spinal stenosis, lumbar region without neurogenic claudication: Secondary | ICD-10-CM | POA: Diagnosis not present

## 2017-07-23 DIAGNOSIS — R1011 Right upper quadrant pain: Secondary | ICD-10-CM | POA: Diagnosis not present

## 2017-07-23 DIAGNOSIS — R1013 Epigastric pain: Secondary | ICD-10-CM | POA: Diagnosis not present

## 2017-07-26 ENCOUNTER — Encounter (INDEPENDENT_AMBULATORY_CARE_PROVIDER_SITE_OTHER): Payer: PPO | Admitting: Physical Medicine and Rehabilitation

## 2017-07-26 DIAGNOSIS — R1013 Epigastric pain: Secondary | ICD-10-CM | POA: Diagnosis not present

## 2017-07-28 DIAGNOSIS — R9431 Abnormal electrocardiogram [ECG] [EKG]: Secondary | ICD-10-CM | POA: Diagnosis not present

## 2017-07-29 ENCOUNTER — Telehealth (INDEPENDENT_AMBULATORY_CARE_PROVIDER_SITE_OTHER): Payer: Self-pay | Admitting: Physical Medicine and Rehabilitation

## 2017-07-29 NOTE — Telephone Encounter (Signed)
Called patient to advise. She says no temp/ infection. Waiting to hear back from ordering physician to make sure they will allow her to proceed. Will call us if we need to reschedule. Thinks she will be fine on her stomach; she is not having any shortness of breath.

## 2017-07-29 NOTE — Telephone Encounter (Signed)
If no infection (Temp etc) and if they do not plan to "tap" an effusion then should be ok. She would need to be able to lay on her stomach for a few minutes (breathing)

## 2017-08-02 ENCOUNTER — Encounter (INDEPENDENT_AMBULATORY_CARE_PROVIDER_SITE_OTHER): Payer: Self-pay | Admitting: Physical Medicine and Rehabilitation

## 2017-08-02 ENCOUNTER — Ambulatory Visit (INDEPENDENT_AMBULATORY_CARE_PROVIDER_SITE_OTHER): Payer: PPO | Admitting: Physical Medicine and Rehabilitation

## 2017-08-02 ENCOUNTER — Ambulatory Visit (INDEPENDENT_AMBULATORY_CARE_PROVIDER_SITE_OTHER): Payer: PPO

## 2017-08-02 VITALS — BP 134/69 | HR 82 | Temp 97.7°F | Ht 67.0 in | Wt 229.0 lb

## 2017-08-02 DIAGNOSIS — M47816 Spondylosis without myelopathy or radiculopathy, lumbar region: Secondary | ICD-10-CM

## 2017-08-02 MED ORDER — LIDOCAINE HCL (PF) 1 % IJ SOLN
2.0000 mL | Freq: Once | INTRAMUSCULAR | Status: AC
  Administered 2017-08-02: 2 mL

## 2017-08-02 MED ORDER — METHYLPREDNISOLONE ACETATE 80 MG/ML IJ SUSP
80.0000 mg | Freq: Once | INTRAMUSCULAR | Status: AC
  Administered 2017-08-02: 80 mg

## 2017-08-02 NOTE — Progress Notes (Deleted)
Left buttock pain with left leg pain to ankle at times.  No n/t.  Worse at night.  Taking tylenol and motrin for pain.  Increased activity causes increased pain.  Sitting is ok, until she goes to stand.  Laying down increases pain, especially at nighttime.

## 2017-08-02 NOTE — Patient Instructions (Signed)

## 2017-08-02 NOTE — Progress Notes (Signed)
Tina Baker - 71 y.o. female MRN [id number]  Date of birth: [birth year]  Office Visit Note: Visit Date: 08/02/2017 PCP: Dyann Ruddle, MD Referred by: Dyann Ruddle, MD  Subjective: Chief Complaint  Patient presents with  . Lower Back - Pain   HPI: Tina Baker is a very pleasant 71 year old female who was kindly sent here by Dr. Saintclair Halsted for left L3-4 and L4-5 facet joint injections diagnostically and hopefully therapeutically. She is describing left buttock pain with pain into the left leg and at times to the ankle. He reports that sitting is okay but when she goes to stand is quite a bit of pain. She also reports increasing activity causes increasing pain. She also gets pain with laying down at night. She had lumbar surgery by Dr. Gladstone Lighter in 2010 to try to resolve her low back and left radicular type pain that she was having. She reports that she was better after surgery but still never really got to the point where she had no pain in the left hip and leg. She has pain mainly in the buttock and left side of the leg in L5 distribution but she denies numbness and tingling. She feels some weakness in the legs but no foot drop.  She has some of her care at [facility] and a recent MRI is reviewed below again. She does have facet arthropathy at both L3-4 and L4-L5-S1. She also has some pain over the greater trochanter that does reproduce some of her pain. She is unable to lay on the left side in bed at night and as does keep her from sleeping very well.    ROS Otherwise per HPI.  Assessment & Plan: Visit Diagnoses:  1. Spondylosis without myelopathy or radiculopathy, lumbar region     Plan: Findings:  Diagnostic and therapeutic left L3-4 and L4-5 facet joint block performed today. He was excellent flow of contrast producing a partial arthrograms. Depending on the relief would suggest greater trochanteric injection with fluoroscopic guidance on the left. I doubt this is the predominant  pain in her back but I think it is continuing to some of the pain in the hip and leg.    Meds & Orders:  Meds ordered this encounter  Medications  . lidocaine (PF) (XYLOCAINE) 1 % injection 2 mL  . methylPREDNISolone acetate (DEPO-MEDROL) injection 80 mg    Orders Placed This Encounter  Procedures  . Facet Injection  . XR C-ARM NO REPORT    Follow-up: Return for Dr. Saintclair Halsted.   Procedures: No procedures performed   Lumbar Facet Joint Intra-Articular Injection(s) with Fluoroscopic Guidance  Patient: Tina Baker      Date of Birth: 1946/06/16 MRN: [id number] PCP: Dyann Ruddle, MD      Visit Date: 08/02/2017   Universal Protocol:    Date/Time: 08/02/2017  Consent Given By: the patient  Position: PRONE   Additional Comments: Vital signs were monitored before and after the procedure. Patient was prepped and draped in the usual sterile fashion. The correct patient, procedure, and site was verified.   Injection Procedure Details:  Procedure Site One Meds Administered:  Meds ordered this encounter  Medications  . lidocaine (PF) (XYLOCAINE) 1 % injection 2 mL  . methylPREDNISolone acetate (DEPO-MEDROL) injection 80 mg     Laterality: Left  Location/Site:  L3-L4 L4-L5  Needle size: 22 guage  Needle type: Spinal  Needle Placement: Articular  Findings:  -Contrast Used: 0.5 mL iohexol 180 mg iodine/mL   -Comments: Excellent  flow of contrast producing a partial arthrogram.  Procedure Details: The fluoroscope beam is vertically oriented in AP, and the inferior recess is visualized beneath the lower pole of the inferior apophyseal process, which represents the target point for needle insertion. When direct visualization is difficult the target point is located at the medial projection of the vertebral pedicle. The region overlying each aforementioned target is locally anesthetized with a 1 to 2 ml. volume of 1% Lidocaine without Epinephrine.   The spinal needle  was inserted into each of the above mentioned facet joints using biplanar fluoroscopic guidance. A 0.25 to 0.5 ml. volume of Isovue-250 was injected and a partial facet joint arthrogram was obtained. A single spot film was obtained of the resulting arthrogram.    One to 1.25 ml of the steroid/anesthetic solution was then injected into each of the facet joints noted above.   Additional Comments:  The patient tolerated the procedure well Dressing: Band-Aid    Post-procedure details: Patient was observed during the procedure. Post-procedure instructions were reviewed.  Patient left the clinic in stable condition.     Clinical History: MRI LUMBAR SPINE WITHOUT CONTRAST 03/12/2017   COMPARISON: 03/01/2012  FINDINGS: Segmentation: Standard.  Alignment: Chronic grade 1 anterolisthesis at L4-5, facet mediated.  Vertebrae: Interval but chronic Schmorl's nodes at L1, L3, and L4. No acute fracture, discitis, or aggressive bone lesion.  Conus medullaris: Extends to the L1-2 disc level and appears normal.  Paraspinal and other soft tissues: Negative  Disc levels:  T12- L1: Unremarkable.  L1-L2: Progressed disc narrowing with minor annulus bulging. Negative facets. No impingement  L2-L3: Progressed disc narrowing with minimal annulus bulging. Minor facet spurring. No impingement  L3-L4: Chronic disc height loss and central broad disc protrusion. Facet hypertrophy and ligament thickening that is mildly progressed. Progressed bilateral subarticular recess stenosis without static L4 compression. Patent foramina  L4-L5: Advanced facet arthropathy with chronic anterolisthesis. Progressed degenerative disc narrowing with mild bulging and a central disc protrusion. The disc protrusion has regressed since prior. Spinal stenosis is improved but moderate. Bilateral subarticular recess impingement is improved. Foraminal distortion and narrowing without L4 compression.  L5-S1:Advanced  degenerative disc narrowing with endplate ridging. Bilateral facet spurring. No impingement  IMPRESSION: 1. L4-5 moderate spinal and bilateral subarticular recess stenosis that is improved compared to 2013 due to regressed disc protrusion. 2. Elsewhere, there has been generalized mild progression of spinal degeneration when compared to 2013. 3. L3-4 mild bilateral subarticular recess stenosis.   Electronically Signed By: Monte Fantasia M.D. On: 19:12  MRI LUMBAR SPINE WITHOUT AND WITH CONTRAST  Technique: Multiplanar and multiecho pulse sequences of the lumbar spine were obtained without and with intravenous contrast.  Contrast: 72m MULTIHANCE GADOBENATE DIMEGLUMINE 529 MG/ML IV SOLN  B[facility] and [facility] [address] Results: BUN 16 mg/dL, Creatinine 0.8 mg/dL.  Comparison: 11/01/2008  Findings: As on the prior exam, the lowest full intervertebral disk space is labeled L5-S1. If procedural intervention is to be performed, careful correlation with this numbering strategy is recommended.  The conus medullaris appears unremarkable. Conus level: L1-2.  Scattered small retroperitoneal lymph nodes are not pathologically enlarged by size criteria.  Prior posterior decompression noted at L3-4 and L4-5.  There is 3 mm of degenerative posterior subluxation of at L3-4 and 4 mm of degenerative anterior subluxation of L4-5. Continued reduced intervertebral disc height that L5-S1 noted.  Low-level facet edema at the L4-5 level is attributed to degenerative facet  arthropathy.  Additional findings at individual levels are as follows:  L1-2: Mild facet arthropathy and minimal bulge, no impingement.  L2-3: Mild disc bulge, no impingement.  L3-4: Mild bulge with shallow left paracentral disc protrusion noted. There is some minimal enhancement in this vicinity, and only borderline left subarticular lateral recess  stenosis. No overt central stenosis.  L4-5: Considerable facet arthropathy is present bilaterally with encroachment due to spurring. Disc bulge noted with recurrent central disc protrusion extending slightly cephalad as shown on image 6 of series 8. Postcontrast images 22-23 of series 9 raise the possibility of a small right synovial cyst extending from the right facet joint. Prior posterior decompression noted with considerable epidural fibrosis posterior to the thecal sac and along the right side of the thecal sac. Overall there is moderate central stenosis along with moderate to prominent bilateral subarticular lateral recess stenosis. Facet spurring causes mild right and borderline left foraminal stenosis. Small facet effusions are present bilaterally.  L5-S1: Postoperative findings noted with mild facet arthropathy and left eccentric disc osteophyte complex. There is borderline bilateral foraminal stenosis along with mild left and borderline right subarticular lateral recess stenosis. The degree of foraminal spurring from the facet joints appears less striking than on the preoperative exam from 2009.  IMPRESSION:  1. Disc bulge and recurrent disc protrusion at L4-5 causing moderate central stenosis and moderate to prominent bilateral subarticular lateral recess stenosis. There is mild right foraminal stenosis at this level due to facet spurring. There may also be a small right synovial cyst extending caudad from the right L4-5 facet contributing to some of the posterolateral encroachment. 2. Mild left subarticular lateral recess stenosis at L5-S1 primarily due to spurring. 3. Additional spondylosis and degenerative disc disease as noted above, without additional associated impingement.  She reports that she has never smoked. She has never used smokeless tobacco. No results for input(s): HGBA1C, LABURIC in the last 8760 hours.  Objective:  VS:  HT:_0  (170.2 cm)    WT:229 lb (103.9 kg)  BMI:35.86    BP:134/69  HR:82bpm  TEMP:97.7 F (36.5 C)( )  RESP:100 % Physical Exam  Musculoskeletal:  She ambulates without aid. She has pain with extension rotation to the left. She does have pain over the left greater trochanter. She has no pain with hip rotation. She has good distal strength.    Ortho Exam Imaging: Xr C-arm No Report  Result Date: 08/02/2017 Please see Notes or Procedures tab for imaging impression.   Past Medical/Family/Surgical/Social History: Medications & Allergies reviewed per EMR Patient Active Problem List   Diagnosis Date Noted  . Chronic venous insufficiency 01/09/2015   Past Medical History:  Diagnosis Date  . Arthritis   . Diabetes mellitus   . Hyperlipidemia   . Hypertension    Family History  Problem Relation Age of Onset  . Cancer Mother 57       leukemia   . Heart disease Father 45       heart attack   . Heart disease Sister    Past Surgical History:  Procedure Laterality Date  . ABDOMINAL HYSTERECTOMY  2001  . BUNIONECTOMY Bilateral 1979  . SPINE SURGERY  2010   protruding disc    Social History   Occupational History  . Not on file.   Social History Main Topics  . Smoking status: Never Smoker  . Smokeless tobacco: Never Used  . Alcohol use No  . Drug use: No  . Sexual activity: Not on file

## 2017-08-02 NOTE — Procedures (Signed)
  Lumbar Facet Joint Intra-Articular Injection(s) with Fluoroscopic Guidance  Patient: Tina Baker      Date of Birth: 12/17/1945 MRN: [id number] PCP: Dyann Ruddle, MD      Visit Date: 08/02/2017   Universal Protocol:    Date/Time: 08/02/2017  Consent Given By: the patient  Position: PRONE   Additional Comments: Vital signs were monitored before and after the procedure. Patient was prepped and draped in the usual sterile fashion. The correct patient, procedure, and site was verified.   Injection Procedure Details:  Procedure Site One Meds Administered:  Meds ordered this encounter  Medications  . lidocaine (PF) (XYLOCAINE) 1 % injection 2 mL  . methylPREDNISolone acetate (DEPO-MEDROL) injection 80 mg     Laterality: Left  Location/Site:  L3-L4 L4-L5  Needle size: 22 guage  Needle type: Spinal  Needle Placement: Articular  Findings:  -Contrast Used: 0.5 mL iohexol 180 mg iodine/mL   -Comments: Excellent flow of contrast producing a partial arthrogram.  Procedure Details: The fluoroscope beam is vertically oriented in AP, and the inferior recess is visualized beneath the lower pole of the inferior apophyseal process, which represents the target point for needle insertion. When direct visualization is difficult the target point is located at the medial projection of the vertebral pedicle. The region overlying each aforementioned target is locally anesthetized with a 1 to 2 ml. volume of 1% Lidocaine without Epinephrine.   The spinal needle was inserted into each of the above mentioned facet joints using biplanar fluoroscopic guidance. A 0.25 to 0.5 ml. volume of Isovue-250 was injected and a partial facet joint arthrogram was obtained. A single spot film was obtained of the resulting arthrogram.    One to 1.25 ml of the steroid/anesthetic solution was then injected into each of the facet joints noted above.   Additional Comments:  The patient tolerated the  procedure well Dressing: Band-Aid    Post-procedure details: Patient was observed during the procedure. Post-procedure instructions were reviewed.  Patient left the clinic in stable condition.

## 2017-08-03 DIAGNOSIS — I83813 Varicose veins of bilateral lower extremities with pain: Secondary | ICD-10-CM | POA: Diagnosis not present

## 2017-08-03 DIAGNOSIS — I83893 Varicose veins of bilateral lower extremities with other complications: Secondary | ICD-10-CM | POA: Diagnosis not present

## 2017-08-04 DIAGNOSIS — R0602 Shortness of breath: Secondary | ICD-10-CM | POA: Diagnosis not present

## 2017-08-05 DIAGNOSIS — I83893 Varicose veins of bilateral lower extremities with other complications: Secondary | ICD-10-CM | POA: Diagnosis not present

## 2017-08-16 DIAGNOSIS — I83893 Varicose veins of bilateral lower extremities with other complications: Secondary | ICD-10-CM | POA: Diagnosis not present

## 2017-08-16 DIAGNOSIS — I83813 Varicose veins of bilateral lower extremities with pain: Secondary | ICD-10-CM | POA: Diagnosis not present

## 2017-08-16 DIAGNOSIS — I8311 Varicose veins of right lower extremity with inflammation: Secondary | ICD-10-CM | POA: Diagnosis not present

## 2017-08-16 DIAGNOSIS — I8312 Varicose veins of left lower extremity with inflammation: Secondary | ICD-10-CM | POA: Diagnosis not present

## 2017-08-19 DIAGNOSIS — K828 Other specified diseases of gallbladder: Secondary | ICD-10-CM | POA: Diagnosis not present

## 2017-08-19 DIAGNOSIS — R1013 Epigastric pain: Secondary | ICD-10-CM | POA: Diagnosis not present

## 2017-08-31 DIAGNOSIS — E559 Vitamin D deficiency, unspecified: Secondary | ICD-10-CM | POA: Diagnosis not present

## 2017-08-31 DIAGNOSIS — I10 Essential (primary) hypertension: Secondary | ICD-10-CM | POA: Diagnosis not present

## 2017-08-31 DIAGNOSIS — E119 Type 2 diabetes mellitus without complications: Secondary | ICD-10-CM | POA: Diagnosis not present

## 2017-08-31 DIAGNOSIS — E7800 Pure hypercholesterolemia, unspecified: Secondary | ICD-10-CM | POA: Diagnosis not present

## 2017-09-01 DIAGNOSIS — Z01818 Encounter for other preprocedural examination: Secondary | ICD-10-CM | POA: Diagnosis not present

## 2017-09-01 DIAGNOSIS — E119 Type 2 diabetes mellitus without complications: Secondary | ICD-10-CM | POA: Diagnosis not present

## 2017-09-01 DIAGNOSIS — Z7982 Long term (current) use of aspirin: Secondary | ICD-10-CM | POA: Diagnosis not present

## 2017-09-01 DIAGNOSIS — K828 Other specified diseases of gallbladder: Secondary | ICD-10-CM | POA: Diagnosis not present

## 2017-09-02 ENCOUNTER — Telehealth (INDEPENDENT_AMBULATORY_CARE_PROVIDER_SITE_OTHER): Payer: Self-pay | Admitting: Physical Medicine and Rehabilitation

## 2017-09-02 DIAGNOSIS — E119 Type 2 diabetes mellitus without complications: Secondary | ICD-10-CM | POA: Diagnosis not present

## 2017-09-02 DIAGNOSIS — Z0000 Encounter for general adult medical examination without abnormal findings: Secondary | ICD-10-CM | POA: Diagnosis not present

## 2017-09-02 NOTE — Telephone Encounter (Signed)
Ok for GT bursa injection if she would like and ok to follow up with Dr. Saintclair Halsted

## 2017-09-02 NOTE — Telephone Encounter (Signed)
?  HTA auth for bursa injection

## 2017-09-03 NOTE — Telephone Encounter (Signed)
Submitted expedited auth request on acuity website and attached the last office note

## 2017-09-03 NOTE — Telephone Encounter (Signed)
Received auth and referral entered. Called pt and scheduled her for 09/06/17/

## 2017-09-06 ENCOUNTER — Ambulatory Visit (INDEPENDENT_AMBULATORY_CARE_PROVIDER_SITE_OTHER): Payer: PPO

## 2017-09-06 ENCOUNTER — Encounter (INDEPENDENT_AMBULATORY_CARE_PROVIDER_SITE_OTHER): Payer: Self-pay | Admitting: Physical Medicine and Rehabilitation

## 2017-09-06 ENCOUNTER — Ambulatory Visit (INDEPENDENT_AMBULATORY_CARE_PROVIDER_SITE_OTHER): Payer: PPO | Admitting: Physical Medicine and Rehabilitation

## 2017-09-06 VITALS — BP 158/72 | HR 73

## 2017-09-06 DIAGNOSIS — M7062 Trochanteric bursitis, left hip: Secondary | ICD-10-CM

## 2017-09-06 NOTE — Progress Notes (Deleted)
Left hip pain. Tender to touch. No injury. Complains of ongoing pain for several months. Nothing really helping pain.

## 2017-09-06 NOTE — Patient Instructions (Signed)

## 2017-09-06 NOTE — Progress Notes (Signed)
Tina Baker - 71 y.o. female MRN [id number]  Date of birth: Nov 11, 1945  Office Visit Note: Visit Date: 09/06/2017 PCP: Dyann Ruddle, MD Referred by: Dyann Ruddle, MD  Subjective: Chief Complaint  Patient presents with  . Left Hip - Pain   HPI: Tina Baker is a very pleasant 70 year old female with prior lumbar surgery by Dr. Gladstone Lighter in 2010.  She most recently has seen Dr. Saintclair Halsted.  She has had an MRI recently performed at [facility].  This is reviewed again below.  Shows moderate multifactorial stenosis at L4-5 and a small disc protrusion.  No nerve compression.  We have completed facet joint block and epidural injection without as much relief of this lateral buttock and greater trochanteric pain on the left.  She has trouble laying on that side.    ROS Otherwise per HPI.  Assessment & Plan: Visit Diagnoses:  1. Greater trochanteric bursitis, left     Plan: Findings:  Greater trochanteric bursa injection fluoroscopic guidance due to body habitus.    Meds & Orders: No orders of the defined types were placed in this encounter.  No orders of the defined types were placed in this encounter.   Follow-up: No Follow-up on file.   Procedures: Large Joint Inj: L greater trochanter on 09/06/2017 10:08 AM Indications: pain and diagnostic evaluation Details: 22 G 3.5 in needle, fluoroscopy-guided lateral approach Medications: 6 mL bupivacaine 0.25 %; 40 mg triamcinolone acetonide 40 MG/ML Outcome: tolerated well, no immediate complications  Biplanar imaging was utilized to position the needle tip along the greater trochanter.  There was excellent flow of contrast outlining the bursa. Procedure, treatment alternatives, risks and benefits explained, specific risks discussed. Consent was given by the patient. Patient was prepped and draped in the usual sterile fashion.      No notes on file   Clinical History: MRI LUMBAR SPINE WITHOUT CONTRAST 03/12/2017   COMPARISON:  03/01/2012  FINDINGS: Segmentation: Standard.  Alignment: Chronic grade 1 anterolisthesis at L4-5, facet mediated.  Vertebrae: Interval but chronic Schmorl's nodes at L1, L3, and L4. No acute fracture, discitis, or aggressive bone lesion.  Conus medullaris: Extends to the L1-2 disc level and appears normal.  Paraspinal and other soft tissues: Negative  Disc levels:  T12- L1: Unremarkable.  L1-L2: Progressed disc narrowing with minor annulus bulging. Negative facets. No impingement  L2-L3: Progressed disc narrowing with minimal annulus bulging. Minor facet spurring. No impingement  L3-L4: Chronic disc height loss and central broad disc protrusion. Facet hypertrophy and ligament thickening that is mildly progressed. Progressed bilateral subarticular recess stenosis without static L4 compression. Patent foramina  L4-L5: Advanced facet arthropathy with chronic anterolisthesis. Progressed degenerative disc narrowing with mild bulging and a central disc protrusion. The disc protrusion has regressed since prior. Spinal stenosis is improved but moderate. Bilateral subarticular recess impingement is improved. Foraminal distortion and narrowing without L4 compression.  L5-S1:Advanced degenerative disc narrowing with endplate ridging. Bilateral facet spurring. No impingement  IMPRESSION: 1. L4-5 moderate spinal and bilateral subarticular recess stenosis that is improved compared to 2013 due to regressed disc protrusion. 2. Elsewhere, there has been generalized mild progression of spinal degeneration when compared to 2013. 3. L3-4 mild bilateral subarticular recess stenosis.   Electronically Signed By: Monte Fantasia M.D. On: 19:12  MRI LUMBAR SPINE WITHOUT AND WITH CONTRAST  Technique: Multiplanar and multiecho pulse sequences of the lumbar spine were obtained without and with intravenous contrast.  Contrast: 49m MULTIHANCE GADOBENATE DIMEGLUMINE 529 MG/ML IV  SOLN  BUN  and creatinine were obtained on site at [facility] at [address] Results: BUN 16 mg/dL, Creatinine 0.8 mg/dL.  Comparison: 11/01/2008  Findings: As on the prior exam, the lowest full intervertebral disk space is labeled L5-S1. If procedural intervention is to be performed, careful correlation with this numbering strategy is recommended.  The conus medullaris appears unremarkable. Conus level: L1-2.  Scattered small retroperitoneal lymph nodes are not pathologically enlarged by size criteria.  Prior posterior decompression noted at L3-4 and L4-5.  There is 3 mm of degenerative posterior subluxation of at L3-4 and 4 mm of degenerative anterior subluxation of L4-5. Continued reduced intervertebral disc height that L5-S1 noted.  Low-level facet edema at the L4-5 level is attributed to degenerative facet arthropathy.  Additional findings at individual levels are as follows:  L1-2: Mild facet arthropathy and minimal bulge, no impingement.  L2-3: Mild disc bulge, no impingement.  L3-4: Mild bulge with shallow left paracentral disc protrusion noted. There is some minimal enhancement in this vicinity, and only borderline left subarticular lateral recess stenosis. No overt central stenosis.  L4-5: Considerable facet arthropathy is present bilaterally with encroachment due to spurring. Disc bulge noted with recurrent central disc protrusion extending slightly cephalad as shown on image 6 of series 8. Postcontrast images 22-23 of series 9 raise the possibility of a small right synovial cyst extending from the right facet joint. Prior posterior decompression noted with considerable epidural fibrosis posterior to the thecal sac and along the right side of the thecal sac. Overall there is moderate central stenosis along with moderate to prominent bilateral subarticular lateral recess stenosis. Facet spurring causes mild right and borderline  left foraminal stenosis. Small facet effusions are present bilaterally.  L5-S1: Postoperative findings noted with mild facet arthropathy and left eccentric disc osteophyte complex. There is borderline bilateral foraminal stenosis along with mild left and borderline right subarticular lateral recess stenosis. The degree of foraminal spurring from the facet joints appears less striking than on the preoperative exam from 2009.  IMPRESSION:  1. Disc bulge and recurrent disc protrusion at L4-5 causing moderate central stenosis and moderate to prominent bilateral subarticular lateral recess stenosis. There is mild right foraminal stenosis at this level due to facet spurring. There may also be a small right synovial cyst extending caudad from the right L4-5 facet contributing to some of the posterolateral encroachment. 2. Mild left subarticular lateral recess stenosis at L5-S1 primarily due to spurring. 3. Additional spondylosis and degenerative disc disease as noted above, without additional associated impingement.  She reports that  has never smoked. she has never used smokeless tobacco. No results for input(s): HGBA1C, LABURIC in the last 8760 hours.  Objective:  VS:  HT:    WT:   BMI:     BP:(!) 158/72  HR:73bpm  TEMP: ( )  RESP:  Physical Exam  Musculoskeletal:  Significant pain to palpation over the greater trochanter    Ortho Exam Imaging: No results found.  Past Medical/Family/Surgical/Social History: Medications & Allergies reviewed per EMR Patient Active Problem List   Diagnosis Date Noted  . Chronic venous insufficiency 01/09/2015   Past Medical History:  Diagnosis Date  . Arthritis   . Diabetes mellitus   . Hyperlipidemia   . Hypertension    Family History  Problem Relation Age of Onset  . Cancer Mother 76       leukemia   . Heart disease Father 32       heart attack   . Heart disease  Sister    Past Surgical History:  Procedure Laterality Date  .  ABDOMINAL HYSTERECTOMY  2001  . BUNIONECTOMY Bilateral 1979  . SPINE SURGERY  2010   protruding disc    Social History   Occupational History  . Not on file  Tobacco Use  . Smoking status: Never Smoker  . Smokeless tobacco: Never Used  Substance and Sexual Activity  . Alcohol use: No  . Drug use: No  . Sexual activity: Not on file

## 2017-09-07 MED ORDER — TRIAMCINOLONE ACETONIDE 40 MG/ML IJ SUSP
40.0000 mg | INTRAMUSCULAR | Status: AC | PRN
Start: 2017-09-06 — End: 2017-09-06
  Administered 2017-09-06: 40 mg via INTRA_ARTICULAR

## 2017-09-07 MED ORDER — BUPIVACAINE HCL 0.25 % IJ SOLN
6.0000 mL | INTRAMUSCULAR | Status: AC | PRN
  Administered 2017-09-06: 6 mL via INTRA_ARTICULAR

## 2017-09-28 DIAGNOSIS — Z7982 Long term (current) use of aspirin: Secondary | ICD-10-CM | POA: Diagnosis not present

## 2017-09-28 DIAGNOSIS — I4891 Unspecified atrial fibrillation: Secondary | ICD-10-CM | POA: Diagnosis not present

## 2017-09-28 DIAGNOSIS — F419 Anxiety disorder, unspecified: Secondary | ICD-10-CM | POA: Diagnosis not present

## 2017-09-28 DIAGNOSIS — M5137 Other intervertebral disc degeneration, lumbosacral region: Secondary | ICD-10-CM | POA: Diagnosis not present

## 2017-09-28 DIAGNOSIS — Z794 Long term (current) use of insulin: Secondary | ICD-10-CM | POA: Diagnosis not present

## 2017-09-28 DIAGNOSIS — G2581 Restless legs syndrome: Secondary | ICD-10-CM | POA: Diagnosis not present

## 2017-09-28 DIAGNOSIS — K811 Chronic cholecystitis: Secondary | ICD-10-CM | POA: Diagnosis not present

## 2017-09-28 DIAGNOSIS — F329 Major depressive disorder, single episode, unspecified: Secondary | ICD-10-CM | POA: Diagnosis not present

## 2017-09-28 DIAGNOSIS — D134 Benign neoplasm of liver: Secondary | ICD-10-CM | POA: Diagnosis not present

## 2017-09-28 DIAGNOSIS — Z9889 Other specified postprocedural states: Secondary | ICD-10-CM | POA: Diagnosis not present

## 2017-09-28 DIAGNOSIS — Z23 Encounter for immunization: Secondary | ICD-10-CM | POA: Diagnosis not present

## 2017-09-28 DIAGNOSIS — E119 Type 2 diabetes mellitus without complications: Secondary | ICD-10-CM | POA: Diagnosis not present

## 2017-09-28 DIAGNOSIS — K219 Gastro-esophageal reflux disease without esophagitis: Secondary | ICD-10-CM | POA: Diagnosis not present

## 2017-09-28 DIAGNOSIS — I10 Essential (primary) hypertension: Secondary | ICD-10-CM | POA: Diagnosis not present

## 2017-09-28 DIAGNOSIS — K828 Other specified diseases of gallbladder: Secondary | ICD-10-CM | POA: Diagnosis not present

## 2017-10-13 DIAGNOSIS — E119 Type 2 diabetes mellitus without complications: Secondary | ICD-10-CM | POA: Diagnosis not present

## 2017-10-13 DIAGNOSIS — M7989 Other specified soft tissue disorders: Secondary | ICD-10-CM | POA: Diagnosis not present

## 2017-10-13 DIAGNOSIS — R079 Chest pain, unspecified: Secondary | ICD-10-CM | POA: Diagnosis not present

## 2017-10-13 DIAGNOSIS — Z6836 Body mass index (BMI) 36.0-36.9, adult: Secondary | ICD-10-CM | POA: Diagnosis not present

## 2017-10-13 DIAGNOSIS — Z7984 Long term (current) use of oral hypoglycemic drugs: Secondary | ICD-10-CM | POA: Diagnosis not present

## 2017-10-13 DIAGNOSIS — I480 Paroxysmal atrial fibrillation: Secondary | ICD-10-CM | POA: Diagnosis not present

## 2017-10-13 DIAGNOSIS — Z90710 Acquired absence of both cervix and uterus: Secondary | ICD-10-CM | POA: Diagnosis not present

## 2017-10-13 DIAGNOSIS — R000 Tachycardia, unspecified: Secondary | ICD-10-CM | POA: Diagnosis not present

## 2017-10-13 DIAGNOSIS — R9431 Abnormal electrocardiogram [ECG] [EKG]: Secondary | ICD-10-CM | POA: Diagnosis not present

## 2017-10-13 DIAGNOSIS — Z823 Family history of stroke: Secondary | ICD-10-CM | POA: Diagnosis not present

## 2017-10-13 DIAGNOSIS — R600 Localized edema: Secondary | ICD-10-CM | POA: Diagnosis not present

## 2017-10-13 DIAGNOSIS — Z833 Family history of diabetes mellitus: Secondary | ICD-10-CM | POA: Diagnosis not present

## 2017-10-13 DIAGNOSIS — R0789 Other chest pain: Secondary | ICD-10-CM | POA: Diagnosis not present

## 2017-10-13 DIAGNOSIS — Z7901 Long term (current) use of anticoagulants: Secondary | ICD-10-CM | POA: Diagnosis not present

## 2017-10-13 DIAGNOSIS — Z825 Family history of asthma and other chronic lower respiratory diseases: Secondary | ICD-10-CM | POA: Diagnosis not present

## 2017-10-13 DIAGNOSIS — I10 Essential (primary) hypertension: Secondary | ICD-10-CM | POA: Diagnosis not present

## 2017-10-13 DIAGNOSIS — Z806 Family history of leukemia: Secondary | ICD-10-CM | POA: Diagnosis not present

## 2017-10-13 DIAGNOSIS — I361 Nonrheumatic tricuspid (valve) insufficiency: Secondary | ICD-10-CM | POA: Diagnosis not present

## 2017-10-13 DIAGNOSIS — Z9049 Acquired absence of other specified parts of digestive tract: Secondary | ICD-10-CM | POA: Diagnosis not present

## 2017-10-13 DIAGNOSIS — I4891 Unspecified atrial fibrillation: Secondary | ICD-10-CM | POA: Diagnosis not present

## 2017-10-13 DIAGNOSIS — Z87442 Personal history of urinary calculi: Secondary | ICD-10-CM | POA: Diagnosis not present

## 2017-10-13 DIAGNOSIS — G2581 Restless legs syndrome: Secondary | ICD-10-CM | POA: Diagnosis not present

## 2017-10-13 DIAGNOSIS — Z801 Family history of malignant neoplasm of trachea, bronchus and lung: Secondary | ICD-10-CM | POA: Diagnosis not present

## 2017-10-13 DIAGNOSIS — R2242 Localized swelling, mass and lump, left lower limb: Secondary | ICD-10-CM | POA: Diagnosis not present

## 2017-10-13 DIAGNOSIS — E669 Obesity, unspecified: Secondary | ICD-10-CM | POA: Diagnosis not present

## 2017-10-13 DIAGNOSIS — R002 Palpitations: Secondary | ICD-10-CM | POA: Diagnosis not present

## 2017-10-13 DIAGNOSIS — Z8249 Family history of ischemic heart disease and other diseases of the circulatory system: Secondary | ICD-10-CM | POA: Diagnosis not present

## 2017-10-19 DIAGNOSIS — I8312 Varicose veins of left lower extremity with inflammation: Secondary | ICD-10-CM | POA: Diagnosis not present

## 2017-10-20 DIAGNOSIS — I480 Paroxysmal atrial fibrillation: Secondary | ICD-10-CM | POA: Diagnosis not present

## 2017-10-20 DIAGNOSIS — I10 Essential (primary) hypertension: Secondary | ICD-10-CM | POA: Diagnosis not present

## 2017-10-20 DIAGNOSIS — Z9049 Acquired absence of other specified parts of digestive tract: Secondary | ICD-10-CM | POA: Diagnosis not present

## 2017-10-20 DIAGNOSIS — M25552 Pain in left hip: Secondary | ICD-10-CM | POA: Diagnosis not present

## 2017-10-22 ENCOUNTER — Other Ambulatory Visit: Payer: Self-pay

## 2017-10-22 DIAGNOSIS — Z48815 Encounter for surgical aftercare following surgery on the digestive system: Secondary | ICD-10-CM | POA: Diagnosis not present

## 2017-10-22 DIAGNOSIS — Z4801 Encounter for change or removal of surgical wound dressing: Secondary | ICD-10-CM | POA: Diagnosis not present

## 2017-10-22 DIAGNOSIS — Z9049 Acquired absence of other specified parts of digestive tract: Secondary | ICD-10-CM | POA: Diagnosis not present

## 2017-10-22 NOTE — Patient Outreach (Signed)
[facility]) Care Management  10/22/2017  Tina Baker 02-16-1946 [id number]   Referral Date: 10-22-17 Referral Source: HTA Date of Admission: 10-13-17  Diagnosis: A. Fib. Date of Discharge: 10-15-17 Facility: [place]: HTA  Outreach attempt # 1 Telephone call to patient for Transition of Care Assessment. Patient able to verify HIPAA.  Patient reports she is doing well.  She denies any issues presently. Patient is independent with all aspects of care and still drives.      Conditions: Patient admits to A. Fib, DM, and recent cholecystectomy.  She reports she is still in atrial fibrillation but the rate is controlled but she states she may have cardioversion if not improved in 3 weeks.  She denies any issues with her diabetes.    Medications: Patient denies any issues with medications.  Appointments: Patient reports she has seen her surgeon and been released.  Patient also states she also saw her primary care physician.   Consent: RN CM reviewed [facility] services with patient. Patient declined services at this time as she feels she has no needs.    Plan: RN CM will close case at this time and will notify care management assistant of case status.     Jone Baseman, RN, MSN [facility] Care Management Care Management Coordinator Direct Line [phone] Toll Free: [phone]  Fax: [phone]

## 2017-11-04 DIAGNOSIS — J0140 Acute pansinusitis, unspecified: Secondary | ICD-10-CM | POA: Diagnosis not present

## 2017-11-04 DIAGNOSIS — G44209 Tension-type headache, unspecified, not intractable: Secondary | ICD-10-CM | POA: Diagnosis not present

## 2017-11-08 DIAGNOSIS — G44229 Chronic tension-type headache, not intractable: Secondary | ICD-10-CM | POA: Diagnosis not present

## 2017-11-08 DIAGNOSIS — Z7901 Long term (current) use of anticoagulants: Secondary | ICD-10-CM | POA: Diagnosis not present

## 2017-11-08 DIAGNOSIS — I480 Paroxysmal atrial fibrillation: Secondary | ICD-10-CM | POA: Diagnosis not present

## 2017-11-08 DIAGNOSIS — I4892 Unspecified atrial flutter: Secondary | ICD-10-CM | POA: Diagnosis not present

## 2017-11-08 DIAGNOSIS — I10 Essential (primary) hypertension: Secondary | ICD-10-CM | POA: Diagnosis not present

## 2017-11-11 DIAGNOSIS — J3489 Other specified disorders of nose and nasal sinuses: Secondary | ICD-10-CM | POA: Diagnosis not present

## 2017-11-11 DIAGNOSIS — R51 Headache: Secondary | ICD-10-CM | POA: Diagnosis not present

## 2017-11-15 DIAGNOSIS — R51 Headache: Secondary | ICD-10-CM | POA: Diagnosis not present

## 2017-11-15 DIAGNOSIS — J3489 Other specified disorders of nose and nasal sinuses: Secondary | ICD-10-CM | POA: Diagnosis not present

## 2017-11-16 DIAGNOSIS — R51 Headache: Secondary | ICD-10-CM | POA: Diagnosis not present

## 2017-11-17 DIAGNOSIS — I499 Cardiac arrhythmia, unspecified: Secondary | ICD-10-CM | POA: Diagnosis not present

## 2017-11-17 DIAGNOSIS — I480 Paroxysmal atrial fibrillation: Secondary | ICD-10-CM | POA: Diagnosis not present

## 2017-11-18 ENCOUNTER — Ambulatory Visit (INDEPENDENT_AMBULATORY_CARE_PROVIDER_SITE_OTHER): Payer: PPO | Admitting: Physical Medicine and Rehabilitation

## 2017-11-18 ENCOUNTER — Ambulatory Visit (INDEPENDENT_AMBULATORY_CARE_PROVIDER_SITE_OTHER): Payer: PPO

## 2017-11-18 ENCOUNTER — Encounter (INDEPENDENT_AMBULATORY_CARE_PROVIDER_SITE_OTHER): Payer: Self-pay | Admitting: Physical Medicine and Rehabilitation

## 2017-11-18 VITALS — BP 150/71 | HR 83 | Temp 97.8°F

## 2017-11-18 DIAGNOSIS — M48061 Spinal stenosis, lumbar region without neurogenic claudication: Secondary | ICD-10-CM | POA: Diagnosis not present

## 2017-11-18 DIAGNOSIS — M25552 Pain in left hip: Secondary | ICD-10-CM

## 2017-11-18 DIAGNOSIS — M47816 Spondylosis without myelopathy or radiculopathy, lumbar region: Secondary | ICD-10-CM

## 2017-11-18 DIAGNOSIS — M4316 Spondylolisthesis, lumbar region: Secondary | ICD-10-CM | POA: Diagnosis not present

## 2017-11-18 NOTE — Progress Notes (Deleted)
Pt states pain in left hip and left thigh. Pt states pain has been going on since Christmas 2018. Pt states last injection 09/06/17 helped out a lot until she put up decorations for Christmas then the pain came back. Pt states walking and getting up out of a chair makes it worse, sitting eases pain.

## 2017-11-18 NOTE — Patient Instructions (Signed)

## 2017-11-18 NOTE — Progress Notes (Signed)
Tina Baker - 72 y.o. female MRN [id number]  Date of birth: [birth year]  Office Visit Note: Visit Date: 11/18/2017 PCP: Dyann Ruddle, MD Referred by: Dyann Ruddle, MD  Subjective: Chief Complaint  Patient presents with  . Left Hip - Pain  . Left Thigh - Pain   HPI: Tina Baker is a 72 year old female that I last saw in November 2018 and completed a fluoroscopically guided greater trochanteric injection.  She reports that injection seem to help up until Christmas when she was putting up decorations.  The injections seem to help for maybe a month.  She has a complicated lumbar spine history with prior surgery by Dr. Gladstone Lighter and she is also been followed by Dr. Saintclair Halsted.  Her last MRI that shows advanced facet arthropathy at L4-5 with moderate stenosis but with regression of central disc protrusion.  She comes in today stating her pain is in the left hip and thigh.  She gets some pain in the groin area.  She gets some pain laterally.  She reports that walking and getting up out of a chair seems to make things worse.  She reports that sitting eases her symptoms.  She has had diagnostic injections of the facet joints as well as epidural injections without much relief.  Her case is complicated by diabetes as well as being on anticoagulation.  Ring of the hips from an x-ray standpoint shows degenerative changes of both hips moderate arthritis.    Review of Systems  Constitutional: Negative for chills, fever, malaise/fatigue and weight loss.  HENT: Negative for hearing loss and sinus pain.   Eyes: Negative for blurred vision, double vision and photophobia.  Respiratory: Negative for cough and shortness of breath.   Cardiovascular: Negative for chest pain, palpitations and leg swelling.  Gastrointestinal: Negative for abdominal pain, nausea and vomiting.  Genitourinary: Negative for flank pain.  Musculoskeletal: Positive for back pain and joint pain. Negative for myalgias.  Skin: Negative for  itching and rash.  Neurological: Negative for tremors, focal weakness and weakness.  Endo/Heme/Allergies: Negative.   Psychiatric/Behavioral: Negative for depression.  All other systems reviewed and are negative.  Otherwise per HPI.  Assessment & Plan: Visit Diagnoses:  1. Pain in left hip   2. Spinal stenosis of lumbar region without neurogenic claudication   3. Spondylosis without myelopathy or radiculopathy, lumbar region   4. Spondylolisthesis of lumbar region     Plan: Findings:  Complicated low back and left hip pain picture which could be a combination of bursitis colitis facet arthropathy and as well as hip issues.  Greater trochanteric injection gave her the most relief but it was very short-lived.  She is getting a little bit of groin symptoms today and worse with rotation.  Take a diagnostic arthrogram on the left would be warranted one time just to see how much relief she gets.  If she gets a great amount of relief and it lasts for a good length of time obviously is something he could repeat.  It does not last for a while but shows really good relief benefited by following up with orthopedic surgery.  She may need MRI of the hip itself.  Otherwise this could still be issues with her lumbar spine but she is just not really done well with prior interventional spine procedures.  She has had physical therapy.  Continues to try to stay active.    Meds & Orders: No orders of the defined types were placed in this encounter.  Orders Placed This Encounter  Procedures  . Large Joint Inj: L hip joint  . XR C-ARM NO REPORT    Follow-up: Return if symptoms worsen or fail to improve.   Procedures: Large Joint Inj: L hip joint on 11/18/2017 11:13 AM Indications: pain and diagnostic evaluation Details: 22 G needle, anterior approach  Arthrogram: Yes  Medications: 3 mL bupivacaine 0.5 %; 80 mg triamcinolone acetonide 40 MG/ML Outcome: tolerated well, no immediate  complications  Arthrogram demonstrated excellent flow of contrast throughout the joint surface without extravasation or obvious defect.  The patient seemed to have relief of symptoms during the anesthetic phase of the injection.  Procedure, treatment alternatives, risks and benefits explained, specific risks discussed. Consent was given by the patient. Immediately prior to procedure a time out was called to verify the correct patient, procedure, equipment, support staff and site/side marked as required. Patient was prepped and draped in the usual sterile fashion.      No notes on file   Clinical History: MRI LUMBAR SPINE WITHOUT CONTRAST 03/12/2017   COMPARISON: 03/01/2012  FINDINGS: Segmentation: Standard.  Alignment: Chronic grade 1 anterolisthesis at L4-5, facet mediated.  Vertebrae: Interval but chronic Schmorl's nodes at L1, L3, and L4. No acute fracture, discitis, or aggressive bone lesion.  Conus medullaris: Extends to the L1-2 disc level and appears normal.  Paraspinal and other soft tissues: Negative  Disc levels:  T12- L1: Unremarkable.  L1-L2: Progressed disc narrowing with minor annulus bulging. Negative facets. No impingement  L2-L3: Progressed disc narrowing with minimal annulus bulging. Minor facet spurring. No impingement  L3-L4: Chronic disc height loss and central broad disc protrusion. Facet hypertrophy and ligament thickening that is mildly progressed. Progressed bilateral subarticular recess stenosis without static L4 compression. Patent foramina  L4-L5: Advanced facet arthropathy with chronic anterolisthesis. Progressed degenerative disc narrowing with mild bulging and a central disc protrusion. The disc protrusion has regressed since prior. Spinal stenosis is improved but moderate. Bilateral subarticular recess impingement is improved. Foraminal distortion and narrowing without L4 compression.  L5-S1:Advanced degenerative disc narrowing with  endplate ridging. Bilateral facet spurring. No impingement  IMPRESSION: 1. L4-5 moderate spinal and bilateral subarticular recess stenosis that is improved compared to 2013 due to regressed disc protrusion. 2. Elsewhere, there has been generalized mild progression of spinal degeneration when compared to 2013. 3. L3-4 mild bilateral subarticular recess stenosis.   Electronically Signed By: Monte Fantasia M.D. 03/12/2017  She reports that  has never smoked. she has never used smokeless tobacco. No results for input(s): HGBA1C, LABURIC in the last 8760 hours.  Objective:  VS:  HT:    WT:   BMI:     BP:(!) 150/71  HR:83bpm  TEMP:97.8 F (36.6 C)(Oral)  RESP:96 % Physical Exam  Ortho Exam Imaging: No results found.  Past Medical/Family/Surgical/Social History: Medications & Allergies reviewed per EMR Patient Active Problem List   Diagnosis Date Noted  . Chronic venous insufficiency 01/09/2015   Past Medical History:  Diagnosis Date  . Arthritis   . Atrial fibrillation ([place])   . Diabetes mellitus   . Hyperlipidemia   . Hypertension    Family History  Problem Relation Age of Onset  . Cancer Mother 63       leukemia   . Heart disease Father 56       heart attack   . Heart disease Sister    Past Surgical History:  Procedure Laterality Date  . ABDOMINAL HYSTERECTOMY  2001  . BUNIONECTOMY Bilateral 1979  .  GALLBLADDER SURGERY    . SPINE SURGERY  2010   protruding disc    Social History   Occupational History  . Not on file  Tobacco Use  . Smoking status: Never Smoker  . Smokeless tobacco: Never Used  Substance and Sexual Activity  . Alcohol use: No  . Drug use: No  . Sexual activity: Not on file

## 2017-11-24 DIAGNOSIS — M79671 Pain in right foot: Secondary | ICD-10-CM | POA: Diagnosis not present

## 2017-11-24 DIAGNOSIS — M79672 Pain in left foot: Secondary | ICD-10-CM | POA: Diagnosis not present

## 2017-11-24 DIAGNOSIS — E119 Type 2 diabetes mellitus without complications: Secondary | ICD-10-CM | POA: Diagnosis not present

## 2017-11-24 DIAGNOSIS — L84 Corns and callosities: Secondary | ICD-10-CM | POA: Diagnosis not present

## 2017-11-24 DIAGNOSIS — B351 Tinea unguium: Secondary | ICD-10-CM | POA: Diagnosis not present

## 2017-12-07 ENCOUNTER — Telehealth (INDEPENDENT_AMBULATORY_CARE_PROVIDER_SITE_OTHER): Payer: Self-pay | Admitting: Physical Medicine and Rehabilitation

## 2017-12-07 NOTE — Telephone Encounter (Signed)
Ok for OV and possible bursa injection, prior injection to last one was a bursa injection. She has had facet and esi in past. Sees Dr. Renne Musca neurosurgeon

## 2017-12-07 NOTE — Telephone Encounter (Signed)
Scheduled for 12/23/17.

## 2017-12-10 DIAGNOSIS — E119 Type 2 diabetes mellitus without complications: Secondary | ICD-10-CM | POA: Diagnosis not present

## 2017-12-10 DIAGNOSIS — H1851 Endothelial corneal dystrophy: Secondary | ICD-10-CM | POA: Diagnosis not present

## 2017-12-10 DIAGNOSIS — H2513 Age-related nuclear cataract, bilateral: Secondary | ICD-10-CM | POA: Diagnosis not present

## 2017-12-20 ENCOUNTER — Encounter (INDEPENDENT_AMBULATORY_CARE_PROVIDER_SITE_OTHER): Payer: Self-pay | Admitting: Physical Medicine and Rehabilitation

## 2017-12-20 MED ORDER — BUPIVACAINE HCL 0.5 % IJ SOLN
3.0000 mL | INTRAMUSCULAR | Status: AC | PRN
  Administered 2017-11-18: 3 mL via INTRA_ARTICULAR

## 2017-12-20 MED ORDER — TRIAMCINOLONE ACETONIDE 40 MG/ML IJ SUSP
80.0000 mg | INTRAMUSCULAR | Status: AC | PRN
  Administered 2017-11-18: 80 mg via INTRA_ARTICULAR

## 2017-12-21 DIAGNOSIS — I10 Essential (primary) hypertension: Secondary | ICD-10-CM | POA: Diagnosis not present

## 2017-12-21 DIAGNOSIS — I480 Paroxysmal atrial fibrillation: Secondary | ICD-10-CM | POA: Diagnosis not present

## 2017-12-21 DIAGNOSIS — Z7901 Long term (current) use of anticoagulants: Secondary | ICD-10-CM | POA: Diagnosis not present

## 2017-12-23 ENCOUNTER — Ambulatory Visit (INDEPENDENT_AMBULATORY_CARE_PROVIDER_SITE_OTHER): Payer: PPO | Admitting: Physical Medicine and Rehabilitation

## 2017-12-23 ENCOUNTER — Ambulatory Visit (INDEPENDENT_AMBULATORY_CARE_PROVIDER_SITE_OTHER): Payer: Self-pay

## 2017-12-23 ENCOUNTER — Encounter (INDEPENDENT_AMBULATORY_CARE_PROVIDER_SITE_OTHER): Payer: Self-pay | Admitting: Physical Medicine and Rehabilitation

## 2017-12-23 VITALS — BP 149/81 | HR 74

## 2017-12-23 DIAGNOSIS — M5116 Intervertebral disc disorders with radiculopathy, lumbar region: Secondary | ICD-10-CM | POA: Diagnosis not present

## 2017-12-23 DIAGNOSIS — M5416 Radiculopathy, lumbar region: Secondary | ICD-10-CM

## 2017-12-23 DIAGNOSIS — M7062 Trochanteric bursitis, left hip: Secondary | ICD-10-CM | POA: Diagnosis not present

## 2017-12-23 DIAGNOSIS — M48062 Spinal stenosis, lumbar region with neurogenic claudication: Secondary | ICD-10-CM

## 2017-12-23 MED ORDER — BUPIVACAINE HCL 0.25 % IJ SOLN
4.0000 mL | INTRAMUSCULAR | Status: AC | PRN
  Administered 2017-12-23: 4 mL via INTRA_ARTICULAR

## 2017-12-23 MED ORDER — LIDOCAINE HCL 2 % IJ SOLN
4.0000 mL | INTRAMUSCULAR | Status: AC | PRN
  Administered 2017-12-23: 4 mL

## 2017-12-23 MED ORDER — TRIAMCINOLONE ACETONIDE 40 MG/ML IJ SUSP
80.0000 mg | INTRAMUSCULAR | Status: AC | PRN
  Administered 2017-12-23: 80 mg via INTRA_ARTICULAR

## 2017-12-23 NOTE — Patient Instructions (Signed)

## 2017-12-23 NOTE — Progress Notes (Signed)
Tina Baker - 72 y.o. female MRN [id number]  Date of birth: 04-Jun-1946  Office Visit Note: Visit Date: 12/23/2017 PCP: Tina Ruddle, MD Referred by: Tina Ruddle, MD  Subjective: Chief Complaint  Patient presents with  . Left Hip - Pain   HPI: Tina Baker is a 72 year old with chronic worsening left hip and thigh pain.  Interestingly we completed a trochanteric injection on the left the fall of last year with fluoroscopic guidance Duda body habitus and she did extremely well up until the last time we saw her a few weeks ago.  At that time she was having some referral pain into the groin we did review x-rays of her hip.  She has some mild degenerative change on x-ray.  We completed intra-articular hip injection with essentially resolution of the groin pain.  She reports still continued pain with lateral hip to anterior lateral thigh to the knee.  She does not get any pain past the knee.  It is more of an aching pain and no paresthesia or tingling.  Her case is difficult because she does have advanced facet arthropathy of the lower spine particularly at L4-5 where she has had some stenosis of the central canal and lateral recesses although the 2018 shows some progression of the stenosis due to regression of central disc protrusion.  Epidural injections at the time were not very beneficial.  She has been followed by Tina Baker.  She does not really want to go back to see him at all.    Review of Systems  Constitutional: Negative for chills, fever, malaise/fatigue and weight loss.  HENT: Negative for hearing loss and sinus pain.   Eyes: Negative for blurred vision, double vision and photophobia.  Respiratory: Negative for cough and shortness of breath.   Cardiovascular: Negative for chest pain, palpitations and leg swelling.  Gastrointestinal: Negative for abdominal pain, nausea and vomiting.  Genitourinary: Negative for flank pain.  Musculoskeletal: Positive for back pain and joint pain.  Negative for myalgias.  Skin: Negative for itching and rash.  Neurological: Negative for tremors, focal weakness and weakness.  Endo/Heme/Allergies: Negative.   Psychiatric/Behavioral: Negative for depression.  All other systems reviewed and are negative.  Otherwise per HPI.  Assessment & Plan: Visit Diagnoses:  1. Trochanteric bursitis of left hip   2. Spinal stenosis of lumbar region with neurogenic claudication   3. Lumbar radiculopathy   4. Radiculopathy due to lumbar intervertebral disc disorder     Plan: Findings:  Exam and history today consistent with greater trochanteric bursitis on the left.  Also could be consistent with an L4 radicular pattern.  She did get relief with intra-articular hip injection of the groin pain so this may be part and parcel of having both a low back and hip problem.  I think repeating the greater trochanteric injection diagnostically is a good idea to give her several months of relief.  If it does not give her that much relief this time we will have to pursue this is more of a radicular pain.  We will talk to her at that point about plans if it becomes more radicular in nature.  Talked about activity modification as well as foam rollers etc. for bursitis.    Meds & Orders: No orders of the defined types were placed in this encounter.   Orders Placed This Encounter  Procedures  . Large Joint Inj: L greater trochanter  . XR C-ARM NO REPORT    Follow-up: Return if symptoms worsen  or fail to improve.   Procedures: Large Joint Inj: L greater trochanter on 12/23/2017 10:23 AM Indications: pain and diagnostic evaluation Details: 22 G 3.5 in needle, fluoroscopy-guided lateral approach  Arthrogram: No  Medications: 4 mL lidocaine 2 %; 80 mg triamcinolone acetonide 40 MG/ML; 4 mL bupivacaine 0.25 % Outcome: tolerated well, no immediate complications  There was excellent flow of contrast outlined the greater trochanteric bursa without vascular  uptake. Procedure, treatment alternatives, risks and benefits explained, specific risks discussed. Consent was given by the patient. Immediately prior to procedure a time out was called to verify the correct patient, procedure, equipment, support staff and site/side marked as required. Patient was prepped and draped in the usual sterile fashion.      No notes on file   Clinical History: MRI LUMBAR SPINE WITHOUT CONTRAST 03/12/2017   COMPARISON: 03/01/2012  FINDINGS: Segmentation: Standard.  Alignment: Chronic grade 1 anterolisthesis at L4-5, facet mediated.  Vertebrae: Interval but chronic Schmorl's nodes at L1, L3, and L4. No acute fracture, discitis, or aggressive bone lesion.  Conus medullaris: Extends to the L1-2 disc level and appears normal.  Paraspinal and other soft tissues: Negative  Disc levels:  T12- L1: Unremarkable.  L1-L2: Progressed disc narrowing with minor annulus bulging. Negative facets. No impingement  L2-L3: Progressed disc narrowing with minimal annulus bulging. Minor facet spurring. No impingement  L3-L4: Chronic disc height loss and central broad disc protrusion. Facet hypertrophy and ligament thickening that is mildly progressed. Progressed bilateral subarticular recess stenosis without static L4 compression. Patent foramina  L4-L5: Advanced facet arthropathy with chronic anterolisthesis. Progressed degenerative disc narrowing with mild bulging and a central disc protrusion. The disc protrusion has regressed since prior. Spinal stenosis is improved but moderate. Bilateral subarticular recess impingement is improved. Foraminal distortion and narrowing without L4 compression.  L5-S1:Advanced degenerative disc narrowing with endplate ridging. Bilateral facet spurring. No impingement  IMPRESSION: 1. L4-5 moderate spinal and bilateral subarticular recess stenosis that is improved compared to 2013 due to regressed disc protrusion. 2. Elsewhere,  there has been generalized mild progression of spinal degeneration when compared to 2013. 3. L3-4 mild bilateral subarticular recess stenosis.   Electronically Signed By: Tina Baker M.D. 03/12/2017  She reports that  has never smoked. she has never used smokeless tobacco. No results for input(s): HGBA1C, LABURIC in the last 8760 hours.  Objective:  VS:  HT:    WT:   BMI:     BP:(!) 149/81  HR:74bpm  TEMP: ( )  RESP:  Physical Exam  Constitutional: She is oriented to person, place, and time. She appears well-developed and well-nourished. No distress.  HENT:  Head: Normocephalic and atraumatic.  Nose: Nose normal.  Mouth/Throat: Oropharynx is clear and moist.  Eyes: Conjunctivae are normal. Pupils are equal, round, and reactive to light.  Neck: Normal range of motion. Neck supple.  Cardiovascular: Regular rhythm and intact distal pulses.  Pulmonary/Chest: Effort normal. No respiratory distress.  Abdominal: She exhibits no distension. There is no guarding.  Musculoskeletal:  Patient ambulates without aid.  She has no pain with hip rotation other than external greater trochanteric pain.  She has pain over the greater trochanter.  Neurological: She is alert and oriented to person, place, and time. She exhibits normal muscle tone. Coordination normal.  Skin: Skin is warm. No rash noted. No erythema.  Psychiatric: She has a normal mood and affect. Her behavior is normal.  Nursing note and vitals reviewed.   Ortho Exam Imaging: Xr C-arm No Report  Result Date: 12/23/2017 Please see Notes or Procedures tab for imaging impression.   Past Medical/Family/Surgical/Social History: Medications & Allergies reviewed per EMR Patient Active Problem List   Diagnosis Date Noted  . Chronic venous insufficiency 01/09/2015   Past Medical History:  Diagnosis Date  . Arthritis   . Atrial fibrillation ([place])   . Diabetes mellitus   . Hyperlipidemia   . Hypertension    Family History   Problem Relation Age of Onset  . Cancer Mother 89       leukemia   . Heart disease Father 84       heart attack   . Heart disease Sister    Past Surgical History:  Procedure Laterality Date  . ABDOMINAL HYSTERECTOMY  2001  . BUNIONECTOMY Bilateral 1979  . GALLBLADDER SURGERY    . SPINE SURGERY  2010   protruding disc    Social History   Occupational History  . Not on file  Tobacco Use  . Smoking status: Never Smoker  . Smokeless tobacco: Never Used  Substance and Sexual Activity  . Alcohol use: No  . Drug use: No  . Sexual activity: Not on file

## 2017-12-23 NOTE — Progress Notes (Deleted)
Left hip pain. Pain starts at lateral left hip and goes down front of thigh to knee. Groin pain seems improved since hip injection.

## 2018-01-11 DIAGNOSIS — E1165 Type 2 diabetes mellitus with hyperglycemia: Secondary | ICD-10-CM | POA: Diagnosis not present

## 2018-01-11 DIAGNOSIS — M48061 Spinal stenosis, lumbar region without neurogenic claudication: Secondary | ICD-10-CM | POA: Diagnosis not present

## 2018-01-11 DIAGNOSIS — M5126 Other intervertebral disc displacement, lumbar region: Secondary | ICD-10-CM | POA: Diagnosis not present

## 2018-01-11 DIAGNOSIS — R29898 Other symptoms and signs involving the musculoskeletal system: Secondary | ICD-10-CM | POA: Diagnosis not present

## 2018-01-11 DIAGNOSIS — M4316 Spondylolisthesis, lumbar region: Secondary | ICD-10-CM | POA: Diagnosis not present

## 2018-02-01 DIAGNOSIS — R350 Frequency of micturition: Secondary | ICD-10-CM | POA: Diagnosis not present

## 2018-02-01 DIAGNOSIS — K219 Gastro-esophageal reflux disease without esophagitis: Secondary | ICD-10-CM | POA: Diagnosis not present

## 2018-02-03 DIAGNOSIS — I8311 Varicose veins of right lower extremity with inflammation: Secondary | ICD-10-CM | POA: Diagnosis not present

## 2018-02-03 DIAGNOSIS — I83893 Varicose veins of bilateral lower extremities with other complications: Secondary | ICD-10-CM | POA: Diagnosis not present

## 2018-02-03 DIAGNOSIS — I8312 Varicose veins of left lower extremity with inflammation: Secondary | ICD-10-CM | POA: Diagnosis not present

## 2018-02-19 DIAGNOSIS — R1033 Periumbilical pain: Secondary | ICD-10-CM | POA: Diagnosis not present

## 2018-02-19 DIAGNOSIS — K5900 Constipation, unspecified: Secondary | ICD-10-CM | POA: Insufficient documentation

## 2018-02-19 DIAGNOSIS — N390 Urinary tract infection, site not specified: Secondary | ICD-10-CM | POA: Diagnosis not present

## 2018-02-19 DIAGNOSIS — K56609 Unspecified intestinal obstruction, unspecified as to partial versus complete obstruction: Secondary | ICD-10-CM | POA: Diagnosis not present

## 2018-02-19 DIAGNOSIS — K529 Noninfective gastroenteritis and colitis, unspecified: Secondary | ICD-10-CM | POA: Diagnosis not present

## 2018-02-19 DIAGNOSIS — R1084 Generalized abdominal pain: Secondary | ICD-10-CM | POA: Diagnosis not present

## 2018-02-19 DIAGNOSIS — K582 Mixed irritable bowel syndrome: Secondary | ICD-10-CM | POA: Diagnosis not present

## 2018-02-19 DIAGNOSIS — E1165 Type 2 diabetes mellitus with hyperglycemia: Secondary | ICD-10-CM | POA: Diagnosis not present

## 2018-02-25 DIAGNOSIS — E1165 Type 2 diabetes mellitus with hyperglycemia: Secondary | ICD-10-CM | POA: Diagnosis not present

## 2018-02-25 DIAGNOSIS — I8393 Asymptomatic varicose veins of bilateral lower extremities: Secondary | ICD-10-CM | POA: Diagnosis not present

## 2018-02-25 DIAGNOSIS — K582 Mixed irritable bowel syndrome: Secondary | ICD-10-CM | POA: Diagnosis not present

## 2018-03-01 ENCOUNTER — Other Ambulatory Visit: Payer: Self-pay

## 2018-03-01 NOTE — Patient Outreach (Signed)
[facility]) Care Management  03/01/2018  Tina Baker Dec 12, 1945 [id number]   TELEPHONE SCREENING Referral date: 02/25/18 Referral source: [facility] utilization management Referral reason: medication expense and concern Insurance: Health team advantage Attempt #1  Telephone call to patient regarding utilization management referral. Unable to reach patient. HIPAA compliant voice message left with call back phone number.   PLAN:  RNCM will attempt second telephone call within 4 business days.  RNCM will send outreach letter to attempt contact.   Quinn Plowman RN,BSN,CCM [facility] Telephonic  [phone]

## 2018-03-03 DIAGNOSIS — I83893 Varicose veins of bilateral lower extremities with other complications: Secondary | ICD-10-CM | POA: Diagnosis not present

## 2018-03-07 ENCOUNTER — Other Ambulatory Visit: Payer: Self-pay

## 2018-03-07 NOTE — Patient Outreach (Addendum)
[facility]) Care Management  03/07/2018  LONI DELBRIDGE [birth year] [id number]  TELEPHONE SCREENING Referral date: 02/25/18 Referral source: [facility] utilization management Referral reason: medication expense and concern Insurance: Health team advantage  Telephone call to patient regarding utilization management referral. HIPAA verified with patient. Explained reason for call. Patient states she is having difficulty affording some of her medications. Patient states she is on Eliquis which is very expensive for her. She reports she has attempted to get assistance, coupons, and pharmaceutical company help but was declined stating she made to much money.  Patient states she is also having problems with her diabetic medication. Patient states the medication she was taking was not keeping her blood sugars down.  Patient state she is not taking metformin because it makes her nauseated.  States she is currently on glipizide which is not helping her blood sugars come down. Patient states Trulicity makes her nauseated and Januvia is to expensive.  Patient states she would like information regarding Advance directive.  RNCM discussed and offered [facility] care management services to patient. Patient verbally agreed to Education officer, museum and pharmacist follow up.  Denied need for nursing at this time. RNCM encouraged patient to notify [facility] care management if she felt nursing follow up needed at a later time. Patient verbalized understanding.   PLAN; RNCM will refer patient to social worker to assist with advance directive and pharmacy to discuss medication concerns.   Quinn Plowman RN,BSN,CCM [facility] Telephonic  [phone]

## 2018-03-08 ENCOUNTER — Telehealth: Payer: Self-pay | Admitting: Pharmacist

## 2018-03-08 NOTE — Patient Outreach (Signed)
[facility]) Care Management  03/08/2018  Tina Baker [birth year] [id number]   Patient was called regarding medication assistance. She answered the phone but asked that I call her back later because she was in the car driving.  Plan: Appointment made to call the patient back on 03/10/18 at [place], PharmD, [place] Clinical Pharmacist [phone]

## 2018-03-09 ENCOUNTER — Other Ambulatory Visit: Payer: Self-pay | Admitting: Pharmacist

## 2018-03-10 NOTE — Patient Outreach (Signed)
Tina Baker) Care Management  [place]   03/09/2018  Tina Baker Nov 23, 1945 [id number]  Subjective: Patient was called regarding medication assistance. HIPAA identifiers were obtained. Patient is a 72 year old female with multiple medical conditions including but not limited to:  Allergic rhinitis, hypertension, Degenerative disk disease, Afib, type 2 diabetes, restless leg syndrome and vitamin d deficiency.  Patient manages her medications on her own. She expressed difficulty paying for Eliquis.   Objective:   Encounter Medications: Outpatient Encounter Medications as of 03/09/2018  Medication Sig Note  . ALPRAZolam (XANAX) 0.5 MG tablet Take 0.5 mg by mouth at bedtime as needed.     Marland Kitchen aspirin 81 MG tablet Take 81 mg by mouth daily.     Marland Kitchen CARTIA XT 300 MG 24 hr capsule Take 1 capsule by mouth daily.   Marland Kitchen ELIQUIS 5 MG TABS tablet    . flecainide (TAMBOCOR) 50 MG tablet Take 50 mg by mouth 2 (two) times daily.   . furosemide (LASIX) 20 MG tablet Take 20 mg by mouth daily.    Marland Kitchen glipiZIDE (GLUCOTROL) 10 MG tablet TAKE 1 TABLET BY MOUTH TWICE DAILY BEFORE MEALS   . lansoprazole (PREVACID) 30 MG capsule Take 30 mg by mouth daily.     . mupirocin nasal ointment (BACTROBAN) 2 % Place 1 application into the nose 2 (two) times daily. Use one-half of tube in each nostril twice daily for five (5) days. After application, press sides of nose together and gently massage.   . ondansetron (ZOFRAN-ODT) 4 MG disintegrating tablet Place 1 tablet under your tongue every 8 hours as needed   . potassium chloride (K-DUR) 10 MEQ tablet Take 1 tablet by mouth daily.   . Pramipexole Dihydrochloride 1.5 MG TB24 Take 1.5 mg by mouth.   . sertraline (ZOLOFT) 100 MG tablet Take 50 mg by mouth daily.    . sitaGLIPtin (JANUVIA) 100 MG tablet Take 100 mg by mouth daily.   Marland Kitchen zolpidem (AMBIEN) 10 MG tablet TAKE 1 TABLET BY MOUTH AT BEDTIME AS NEEDED FOR INSOMNIA   . canagliflozin  (INVOKANA) 100 MG TABS tablet Take 1 tablet by mouth daily. 03/09/2018: Not taking due to cost  . linaclotide (LINZESS) 72 MCG capsule Take 1 capsule by mouth daily. 03/09/2018: Not taking due to cost  . olmesartan-hydrochlorothiazide (BENICAR HCT) 40-25 MG per tablet Take 1 tablet by mouth daily.     . sucralfate (CARAFATE) 1 g tablet Take 1 g by mouth.   . [DISCONTINUED] clidinium-chlordiazePOXIDE (LIBRAX) 2.5-5 MG per capsule Take 1 capsule by mouth 3 (three) times daily as needed.     . [DISCONTINUED] cyclobenzaprine (FLEXERIL) 5 MG tablet Take 5 mg by mouth 3 (three) times daily as needed for muscle spasms.   . [DISCONTINUED] diltiazem (CARTIA XT) 180 MG 24 hr capsule    . [DISCONTINUED] Flaxseed, Linseed, (FLAXSEED OIL) 1000 MG CAPS Take by mouth daily.     . [DISCONTINUED] glimepiride (AMARYL) 2 MG tablet Take 2 mg by mouth daily with breakfast.   . [DISCONTINUED] indapamide (LOZOL) 2.5 MG tablet Take 2.5 mg by mouth daily.   . [DISCONTINUED] losartan (COZAAR) 100 MG tablet Take 100 mg by mouth daily.   . [DISCONTINUED] metFORMIN (GLUCOPHAGE) 500 MG tablet Take 500 mg by mouth 2 (two) times daily with a meal.     . [DISCONTINUED] Multiple Vitamin (MULTIVITAMIN PO) Take by mouth daily.     . [DISCONTINUED] ondansetron (ZOFRAN) 8 MG tablet Take 8 mg  by mouth every 8 (eight) hours as needed for nausea or vomiting.   . [DISCONTINUED] potassium chloride (K-DUR,KLOR-CON) 10 MEQ tablet Take 10 mEq by mouth 2 (two) times daily.   . [DISCONTINUED] pravastatin (PRAVACHOL) 40 MG tablet Take 40 mg by mouth daily.   . [DISCONTINUED] rOPINIRole (REQUIP) 0.5 MG tablet Take 0.5 mg by mouth 3 (three) times daily.   . [DISCONTINUED] saxagliptin HCl (ONGLYZA) 5 MG TABS tablet Take by mouth daily.     . [DISCONTINUED] sertraline (ZOLOFT) 50 MG tablet Take 100 mg by mouth daily.    . [DISCONTINUED] simvastatin (ZOCOR) 40 MG tablet Take 40 mg by mouth at bedtime.      No facility-administered encounter medications  on file as of 03/09/2018.     Functional Status: No flowsheet data found.  Fall/Depression Screening: No flowsheet data found. PHQ 2/9 Scores 03/07/2018  PHQ - 2 Score 1      Assessment: Patient's medications were reviewed via telephone, Surescripts data and her medication lists from other health systems were also reviewed.   Drugs sorted by system:  Neurologic/Psychologic: Alprazolam Pramipexole Zolpidem Sertraline Olmesartan-HCT-reported not taking   Cardiovascular: Aspirin Cartia XT Eliquis Flecainide Furosemide Sucralfate   Gastrointestinal: Lansoprazole Ondansetron Linzess-not taking due to cost  Endocrine: Invokana--not taking due to cost Januvia  Glipizide  Topical: Mupirocin nasal ointment  Vitamins/Minerals: Potassium Chloride  Medication Review Findings;   Gaps in therapy: not taking ARB  Medications to avoid in the elderly: -high dose Zolpidem--consider decreasing dose to Zolpidem 41m tablets.  Drug interactions:  CNS depression and increased risk of falls:  Alprazolam, Pramipexole, Zolpidem,  Other issues noted:   Medication Assistance Findings:  Patient has Health Team Advantage Patient [place] applied [place] the [place] but she was denied for being over income.  She may qualify to get Januvia from the [employer] (Discussed the necessary financial documentation needed)  She may also qualify for Invokana from [place] it requires patient's to spend 4% of their income in medication expenses.    Plan: Call [place] and inquire about an appeal.  Have another financial conversation with the patient to determine if she has spent the required 4% of her income. (Health Team Advantage reported her TROOP as $$34  Once determining what the patient may actually qualify for, draft a letter to [place] AEtter Sjogrento have the  appropriate applications sent to the patient.  Route note to the patient's PCP.  KElayne Guerin PharmD, BSociety HillClinical Pharmacist [phone]

## 2018-03-11 ENCOUNTER — Other Ambulatory Visit: Payer: Self-pay | Admitting: Pharmacy Technician

## 2018-03-11 ENCOUNTER — Other Ambulatory Visit: Payer: Self-pay

## 2018-03-11 ENCOUNTER — Other Ambulatory Visit: Payer: Self-pay | Admitting: Pharmacist

## 2018-03-11 NOTE — Patient Outreach (Signed)
[place] [facility]) Care Management  03/11/2018  SKYLEY GRANDMAISON 09-09-1946 [id number]  BSW made initial contact with patient to follow-up on referral for completion of advanced directives. HIPAA identifiers verified. Patient states she has not received the advanced directive packet that was placed in the mail earlier this week. Patient does not have questions at this time regarding advanced directives.   Plan: BSW to contact patient next week to review forms and assist with any questions.  Daneen Schick, BSW, CDP Social Worker Cell # [phone] Tillie [email or web address]

## 2018-03-11 NOTE — Patient Outreach (Signed)
[facility]) Care Management  03/11/2018  Tina Baker [birth year] [id number]   [place] on the patient's behalf to check into the possibility of getting an appeal for her denied application.  The representative stated the only type of appeal they would accept is one proving the patient made less than $37,470.  Patient was called. HIPAA identifiers were obtained.  She was informed about the message from [place] for Eliquis. (Her application was sent in by her provider previously).  Patient said she would look for her tax return to see what her income was reported to actually be. She said she did not remember sending anything to prove her income with her original application.  For Januvia, patient appears to meet the requirements and will be sent an application. She has Health Team Advantage and her PCP is Dr. Rip Harbour.  For Invokana-she is not actively taking this due to cost. A note was sent to her provider to let them know if they wanted to restart Invokana she could possibly be approved.  Unfortunately, the patient's TROOP was received from [facility].  She has spent $409.10.  She would need to spend 4% of her income ($1498.80) to qualify.  Patient communicated understanding that she would need to send proof of income.    Patient called me back before I finished documenting her note and said she found her tax return. It confirmed she is over income for Eliquis.  Plan: Letter will be sent to Etter Sjogren, CPhT to send an application to the patient for [employer] Patient Assistance form (Januvia).  Follow up with the patient in 3 weeks.  Elayne Guerin, PharmD, [place] Clinical Pharmacist [phone]

## 2018-03-11 NOTE — Patient Outreach (Signed)
[facility]) Care Management  03/11/2018  KRISTELL WOODING [birth year] [id number]   Received [employer] patient assistance referral from [place] for patients Januvia. Will mail out patient and provider Leta Jungling) portions out on 05/13.  Will follow up with patient on 05/21 to verify receipt of application  Maud Deed. [place], [place] Management [phone]

## 2018-03-16 ENCOUNTER — Ambulatory Visit: Payer: Self-pay

## 2018-03-18 ENCOUNTER — Ambulatory Visit: Payer: Self-pay

## 2018-03-21 ENCOUNTER — Other Ambulatory Visit: Payer: Self-pay

## 2018-03-21 ENCOUNTER — Ambulatory Visit: Payer: Self-pay

## 2018-03-21 NOTE — Patient Outreach (Signed)
[place] [facility] [facility]) Care Management  03/21/2018  LOULA MARCELLA 11/24/1945 [id number]  Telephone call to patient to review Advanced Directives and assist with any questions regarding forms. No answer. HIPAA compliant voice message left requesting a return call.  Plan: BSW will send an unsuccessful outreach letter and attempt patient again within four business days.  Daneen Schick, BSW, CDP Social Worker Cell # [phone] Tillie [email or web address]

## 2018-03-22 DIAGNOSIS — H02421 Myogenic ptosis of right eyelid: Secondary | ICD-10-CM | POA: Diagnosis not present

## 2018-03-23 ENCOUNTER — Other Ambulatory Visit: Payer: Self-pay | Admitting: Pharmacy Technician

## 2018-03-23 DIAGNOSIS — I788 Other diseases of capillaries: Secondary | ICD-10-CM | POA: Diagnosis not present

## 2018-03-23 NOTE — Patient Outreach (Addendum)
[place] [facility]) Care Management  03/23/2018  LATASHA BUCZKOWSKI [birth year] [id number]   Unsuccessful outreach call #1 to patient in reference to receiving [employer] patient assistance application, HIPAA complaint voicemail left.  If call not returned, will make second attempt in the next 2-3 business days.  Maud Deed [place], [place] Management [phone]

## 2018-03-25 ENCOUNTER — Other Ambulatory Visit: Payer: Self-pay

## 2018-03-25 ENCOUNTER — Ambulatory Visit: Payer: Self-pay

## 2018-03-25 DIAGNOSIS — I83892 Varicose veins of left lower extremities with other complications: Secondary | ICD-10-CM | POA: Diagnosis not present

## 2018-03-25 DIAGNOSIS — I8312 Varicose veins of left lower extremity with inflammation: Secondary | ICD-10-CM | POA: Diagnosis not present

## 2018-03-25 NOTE — Patient Outreach (Signed)
[facility]) Care Management  03/25/2018  Tina Baker April 15, 1946 [id number]  BSW attempted to reach patient today to discuss Advanced Directives. Unfortunately patient did not answer. A HIPAA compliant voice message was left requesting a return call.   Plan: BSW to outreach patient in the next 4 business days if a call is not returned sooner. If next outreach is also unsuccessful BSW will plan to resolve social work episode on 5/31 due to 3 unsuccessful outreach attempts since 03/21/18.  Daneen Schick, BSW, CDP Social Worker Cell # [phone] Tillie [email or web address]

## 2018-03-29 ENCOUNTER — Other Ambulatory Visit: Payer: Self-pay | Admitting: Pharmacy Technician

## 2018-03-29 DIAGNOSIS — I8312 Varicose veins of left lower extremity with inflammation: Secondary | ICD-10-CM | POA: Diagnosis not present

## 2018-03-29 DIAGNOSIS — I480 Paroxysmal atrial fibrillation: Secondary | ICD-10-CM | POA: Diagnosis not present

## 2018-03-29 DIAGNOSIS — Z7901 Long term (current) use of anticoagulants: Secondary | ICD-10-CM | POA: Diagnosis not present

## 2018-03-29 DIAGNOSIS — I10 Essential (primary) hypertension: Secondary | ICD-10-CM | POA: Diagnosis not present

## 2018-03-29 DIAGNOSIS — I83892 Varicose veins of left lower extremities with other complications: Secondary | ICD-10-CM | POA: Diagnosis not present

## 2018-03-29 NOTE — Patient Outreach (Addendum)
[place] [facility]) Care Management  03/29/2018  Tina Baker 10-22-1946 [id number]   Successful outreach call, HIPAA identifiers verified. Ms. [name] confirmed that she received the [employer] patient assistance application in the mail. She states that she has an appointment with her provider next week on Wednesday and wants to confirm with her provider that she should go back on medication. Patient states she will call me back and let me know what her and the provider decide.  Will follow up with patient in the next 2 weeks, if she has not called with an update.  Addend 3:12pm:  Received provider portion of [employer] patient assistance application, provider marked out Januvia and wrote in [place]. After speaking with [place], she will outreach provider office to clarify which drug they want to prescribe. (Jardiance would require a different application to be sent into provider and patient.)  Maud Deed. [place], [place] Management [phone]

## 2018-03-30 ENCOUNTER — Telehealth: Payer: Self-pay | Admitting: Pharmacist

## 2018-03-30 NOTE — Patient Outreach (Signed)
[facility]) Care Management  03/30/2018  LUNABELLA BADGETT [birth year] [id number]   Patient's provider was called for clarification on the patient's medications.  Spoke with [place] about the application we sent to their office to request Januvia from [facility] Patient Assistance Program. The application was returned with "Jardiance 50m" written on the drug name line. JVania Reais provided by BFPL Groupand requires a completely different application.   The provider's office CMA said the patient should now be on Jardiance and the reason it was not on her medication list was because it was just being started with the application.  Plan: Add Jardiance to the patient's medication list. Send patient an application to her home for [place] to AEtter Sjogren CPhT to send an application for Jardiance to the patient Follow up in  2 weeks  KElayne Guerin PharmD, BChena RidgeClinical Pharmacist [phone]

## 2018-03-31 ENCOUNTER — Other Ambulatory Visit: Payer: Self-pay

## 2018-03-31 ENCOUNTER — Ambulatory Visit: Payer: Self-pay

## 2018-03-31 NOTE — Patient Outreach (Signed)
[place] [facility]) Care Management  03/31/2018  Tina Baker [birth year] [id number]  Third unsuccessful outreach attempt made to patient to follow up on Advanced Directives. BSW left HIPAA compliant voice message requesting a return call. BSW will plan to resolve social work episode if no return call from patient given three unsuccessful outreach attempts. BSW also sent patient a letter requesting contact on 03/21/18.  Daneen Schick, Arita Miss, CDP Social Worker [phone]

## 2018-04-01 ENCOUNTER — Ambulatory Visit: Payer: Self-pay | Admitting: Pharmacist

## 2018-04-04 DIAGNOSIS — H02421 Myogenic ptosis of right eyelid: Secondary | ICD-10-CM | POA: Diagnosis not present

## 2018-04-04 DIAGNOSIS — Z01818 Encounter for other preprocedural examination: Secondary | ICD-10-CM | POA: Diagnosis not present

## 2018-04-05 ENCOUNTER — Other Ambulatory Visit: Payer: Self-pay

## 2018-04-05 NOTE — Patient Outreach (Signed)
[place] [facility]) Care Management  04/05/2018  AURA BIBBY 1946/04/15 [id number]  BSW received call from patient concerning medication applications. Patient states she has not received the application for decreasing the cost of Jardiance. Patient also states she has applied for lower costs concerning Eliquis in the past and was denied. Patient states if cost can not be lowered she will have to discontinue medication. BSW provided patient with contact information to Denyse Amass, pharmacist assigned to her case. BSW will also in basket Clayborne Artist and Etter Sjogren regarding patients concerns.  Daneen Schick, Arita Miss, CDP Social Worker [phone]

## 2018-04-05 NOTE — Patient Outreach (Signed)
[place] [facility]) Care Management  04/05/2018  Tina Baker 12-Jun-1946 [id number]  Discipline closure due to lack of patient contact. BSW has outreached patient multiple times since 03/21/18 via phone and mail requesting contact. BSW will update [facility] pharmacist who is still actively involved in case of discipline closure.  Daneen Schick, Arita Miss, CDP Social Worker [phone]

## 2018-04-06 ENCOUNTER — Telehealth: Payer: Self-pay | Admitting: Pharmacist

## 2018-04-06 NOTE — Patient Outreach (Signed)
[facility]) Care Management  04/06/2018  Tina Baker [birth year] [id number]   Patient called to let me know that she received the Jardiance application. HIPAA identifiers were obtained. Patient said she would get the application completed and will mail it back to the [facility] office as requested ASAP.  Patient also wanted to know if there were any alternatives to Eliquis besides warfarin. She said she did not want to go on warfarin. Xarelto and Pradaxa are medications in the same drug class. Patient is over income for [employer]'s program (Xarelto).  Patient may qualify for Pradaxa as it provided by [employer]'s program (same program as Jardiance).    It was suggested that the patient try purchasing a thirty day supply of Eliquis vs a ninety day supply but she said even a 30 day supply would be cost prohibitive for her.  Patient said she is planning to discontinue Eliquis due to cost and will monitor her pulse at home as treatment. Patient was counseled on the importance of being adherent to her prescribed medications.  Plan: Route a note to the patient's provider about switching to Pradaxa Call patient back in 3-5 business days.

## 2018-04-11 DIAGNOSIS — F5104 Psychophysiologic insomnia: Secondary | ICD-10-CM | POA: Diagnosis not present

## 2018-04-11 DIAGNOSIS — G2581 Restless legs syndrome: Secondary | ICD-10-CM | POA: Diagnosis not present

## 2018-04-11 DIAGNOSIS — E1165 Type 2 diabetes mellitus with hyperglycemia: Secondary | ICD-10-CM | POA: Diagnosis not present

## 2018-04-13 ENCOUNTER — Other Ambulatory Visit: Payer: Self-pay | Admitting: Pharmacist

## 2018-04-13 DIAGNOSIS — I10 Essential (primary) hypertension: Secondary | ICD-10-CM | POA: Diagnosis not present

## 2018-04-13 DIAGNOSIS — I83812 Varicose veins of left lower extremities with pain: Secondary | ICD-10-CM | POA: Insufficient documentation

## 2018-04-13 DIAGNOSIS — I480 Paroxysmal atrial fibrillation: Secondary | ICD-10-CM | POA: Diagnosis not present

## 2018-04-13 DIAGNOSIS — R600 Localized edema: Secondary | ICD-10-CM | POA: Diagnosis not present

## 2018-04-13 DIAGNOSIS — E119 Type 2 diabetes mellitus without complications: Secondary | ICD-10-CM | POA: Diagnosis not present

## 2018-04-13 DIAGNOSIS — F3341 Major depressive disorder, recurrent, in partial remission: Secondary | ICD-10-CM | POA: Insufficient documentation

## 2018-04-13 NOTE — Patient Outreach (Signed)
[place] [facility]) Care Management  04/13/2018  Tina Baker [birth year] [id number]   Patient was called regarding medication assistance. HIPAA identifiers were obtained.  Patient confirmed she received the patient assistance paperwork we requested. She was somewhat confused about what to put in the income space so she was instructed to leave the space blank and send her tax return in and we will help with completing the form.   Plan: Await returned forms from patient. Patient will be followed by Heywood Bene, CPhT for patient assistance

## 2018-04-21 DIAGNOSIS — I480 Paroxysmal atrial fibrillation: Secondary | ICD-10-CM | POA: Diagnosis not present

## 2018-04-21 DIAGNOSIS — Z7901 Long term (current) use of anticoagulants: Secondary | ICD-10-CM | POA: Diagnosis not present

## 2018-04-21 DIAGNOSIS — I10 Essential (primary) hypertension: Secondary | ICD-10-CM | POA: Diagnosis not present

## 2018-04-29 DIAGNOSIS — I83892 Varicose veins of left lower extremities with other complications: Secondary | ICD-10-CM | POA: Diagnosis not present

## 2018-04-29 DIAGNOSIS — I83812 Varicose veins of left lower extremities with pain: Secondary | ICD-10-CM | POA: Diagnosis not present

## 2018-04-29 DIAGNOSIS — I8312 Varicose veins of left lower extremity with inflammation: Secondary | ICD-10-CM | POA: Diagnosis not present

## 2018-05-06 ENCOUNTER — Other Ambulatory Visit: Payer: Self-pay | Admitting: Pharmacy Technician

## 2018-05-06 NOTE — Patient Outreach (Signed)
[place] [facility]) Care Management  05/06/2018  Tina Baker 1946/06/04 [id number]   Successful outreach attempt, HIPAA identifiers verified. Follow up call with patient, she had informed RPh Alwyn Ren that she had mailed in her portion of the [employer] patient assistance application. [name] informed me that her envelope had been returned and in the that timeframe she spoke to one of her providers who stated they didn't feel as though she should go back on Jardiance. Patient has decided to not continue the patient assistance process.  Will route note to [place] for case closure.  Maud Deed [place], [place] Management [phone]

## 2018-05-09 ENCOUNTER — Encounter: Payer: Self-pay | Admitting: Pharmacist

## 2018-05-09 DIAGNOSIS — M2550 Pain in unspecified joint: Secondary | ICD-10-CM | POA: Diagnosis not present

## 2018-05-09 DIAGNOSIS — I10 Essential (primary) hypertension: Secondary | ICD-10-CM | POA: Diagnosis not present

## 2018-05-19 DIAGNOSIS — I83892 Varicose veins of left lower extremities with other complications: Secondary | ICD-10-CM | POA: Diagnosis not present

## 2018-05-19 DIAGNOSIS — I8312 Varicose veins of left lower extremity with inflammation: Secondary | ICD-10-CM | POA: Diagnosis not present

## 2018-05-21 DIAGNOSIS — G2581 Restless legs syndrome: Secondary | ICD-10-CM | POA: Diagnosis not present

## 2018-05-21 DIAGNOSIS — Z7984 Long term (current) use of oral hypoglycemic drugs: Secondary | ICD-10-CM | POA: Diagnosis not present

## 2018-05-21 DIAGNOSIS — I4891 Unspecified atrial fibrillation: Secondary | ICD-10-CM | POA: Diagnosis not present

## 2018-05-21 DIAGNOSIS — D509 Iron deficiency anemia, unspecified: Secondary | ICD-10-CM | POA: Diagnosis not present

## 2018-05-21 DIAGNOSIS — I10 Essential (primary) hypertension: Secondary | ICD-10-CM | POA: Diagnosis not present

## 2018-05-21 DIAGNOSIS — R197 Diarrhea, unspecified: Secondary | ICD-10-CM | POA: Diagnosis not present

## 2018-05-21 DIAGNOSIS — Z79899 Other long term (current) drug therapy: Secondary | ICD-10-CM | POA: Diagnosis not present

## 2018-05-21 DIAGNOSIS — M7989 Other specified soft tissue disorders: Secondary | ICD-10-CM | POA: Diagnosis not present

## 2018-05-21 DIAGNOSIS — Z7982 Long term (current) use of aspirin: Secondary | ICD-10-CM | POA: Diagnosis not present

## 2018-05-21 DIAGNOSIS — I999 Unspecified disorder of circulatory system: Secondary | ICD-10-CM | POA: Diagnosis not present

## 2018-05-21 DIAGNOSIS — I480 Paroxysmal atrial fibrillation: Secondary | ICD-10-CM | POA: Diagnosis not present

## 2018-05-21 DIAGNOSIS — Z794 Long term (current) use of insulin: Secondary | ICD-10-CM | POA: Diagnosis not present

## 2018-05-21 DIAGNOSIS — D72829 Elevated white blood cell count, unspecified: Secondary | ICD-10-CM | POA: Diagnosis not present

## 2018-05-21 DIAGNOSIS — E118 Type 2 diabetes mellitus with unspecified complications: Secondary | ICD-10-CM | POA: Diagnosis not present

## 2018-05-22 DIAGNOSIS — I10 Essential (primary) hypertension: Secondary | ICD-10-CM | POA: Diagnosis not present

## 2018-05-22 DIAGNOSIS — Z6833 Body mass index (BMI) 33.0-33.9, adult: Secondary | ICD-10-CM | POA: Diagnosis not present

## 2018-05-22 DIAGNOSIS — G2581 Restless legs syndrome: Secondary | ICD-10-CM | POA: Diagnosis not present

## 2018-05-22 DIAGNOSIS — D509 Iron deficiency anemia, unspecified: Secondary | ICD-10-CM | POA: Diagnosis not present

## 2018-05-22 DIAGNOSIS — E119 Type 2 diabetes mellitus without complications: Secondary | ICD-10-CM | POA: Diagnosis not present

## 2018-05-22 DIAGNOSIS — I4891 Unspecified atrial fibrillation: Secondary | ICD-10-CM | POA: Diagnosis not present

## 2018-05-22 DIAGNOSIS — I480 Paroxysmal atrial fibrillation: Secondary | ICD-10-CM | POA: Diagnosis not present

## 2018-05-22 DIAGNOSIS — R9431 Abnormal electrocardiogram [ECG] [EKG]: Secondary | ICD-10-CM | POA: Diagnosis not present

## 2018-05-22 DIAGNOSIS — Z8249 Family history of ischemic heart disease and other diseases of the circulatory system: Secondary | ICD-10-CM | POA: Diagnosis not present

## 2018-05-22 DIAGNOSIS — D72829 Elevated white blood cell count, unspecified: Secondary | ICD-10-CM | POA: Diagnosis not present

## 2018-05-23 DIAGNOSIS — I4891 Unspecified atrial fibrillation: Secondary | ICD-10-CM | POA: Diagnosis not present

## 2018-05-23 DIAGNOSIS — I480 Paroxysmal atrial fibrillation: Secondary | ICD-10-CM | POA: Diagnosis not present

## 2018-05-23 DIAGNOSIS — Z6833 Body mass index (BMI) 33.0-33.9, adult: Secondary | ICD-10-CM | POA: Diagnosis not present

## 2018-05-23 DIAGNOSIS — E6609 Other obesity due to excess calories: Secondary | ICD-10-CM | POA: Diagnosis not present

## 2018-05-23 DIAGNOSIS — D509 Iron deficiency anemia, unspecified: Secondary | ICD-10-CM | POA: Diagnosis not present

## 2018-05-23 DIAGNOSIS — I481 Persistent atrial fibrillation: Secondary | ICD-10-CM | POA: Diagnosis not present

## 2018-05-23 DIAGNOSIS — E118 Type 2 diabetes mellitus with unspecified complications: Secondary | ICD-10-CM | POA: Diagnosis not present

## 2018-05-25 ENCOUNTER — Other Ambulatory Visit: Payer: Self-pay

## 2018-05-25 NOTE — Patient Outreach (Signed)
[place] [facility]) Care Management  05/25/2018  Tina Baker 1946/06/26 [id number]     Transition of Care Referral  Referral Date: 05/25/18  Referral Source: [facility] Discharge Report Date of Admission: unknown Diagnosis: "A-fib" Date of Discharge: 05/23/18 Facility: [place]: [facility]    Outreach attempt # 1 to patient. Spoke with patient who states that things are "going really good." She denies any new and/or worsening issues or concerns. She states that she is very pleased about how she is feeling since discharge. She has made follow up appt for cardiologist and waiting to hear back about appt day for PCP. She denies any issues with transportation. Patient reports she is self sufficient and independent with ADLs/IADLs. RN CM confirmed that patient has all her meds and no she voices no issues or concerns regarding them. Patient familiar with [facility] services as she has had them in the past. She voices no further RN CM or [facility] needs or concerns at this time. She is aware and knows how to contact [facility] for any needs or issues that may arise in the future. Patient voiced understanding and was appreciative of follow up call.     Plan: RN CM will close case at this time.   Enzo Montgomery, RN,BSN,CCM [place] Management Telephonic Care Management Coordinator Direct Phone: [phone] Toll Free: [phone] Fax: [phone]

## 2018-05-26 DIAGNOSIS — I481 Persistent atrial fibrillation: Secondary | ICD-10-CM | POA: Diagnosis not present

## 2018-05-30 DIAGNOSIS — I4891 Unspecified atrial fibrillation: Secondary | ICD-10-CM | POA: Diagnosis not present

## 2018-05-30 DIAGNOSIS — M7632 Iliotibial band syndrome, left leg: Secondary | ICD-10-CM | POA: Diagnosis not present

## 2018-05-30 DIAGNOSIS — G5702 Lesion of sciatic nerve, left lower limb: Secondary | ICD-10-CM | POA: Diagnosis not present

## 2018-05-30 DIAGNOSIS — Z7901 Long term (current) use of anticoagulants: Secondary | ICD-10-CM | POA: Diagnosis not present

## 2018-06-01 ENCOUNTER — Telehealth (INDEPENDENT_AMBULATORY_CARE_PROVIDER_SITE_OTHER): Payer: Self-pay | Admitting: Physical Medicine and Rehabilitation

## 2018-06-01 NOTE — Telephone Encounter (Signed)
Ok for bursa and I can talk to her then with quick eval

## 2018-06-02 NOTE — Telephone Encounter (Signed)
Scheduled for 06/20/18 at 1330.

## 2018-06-15 ENCOUNTER — Telehealth (INDEPENDENT_AMBULATORY_CARE_PROVIDER_SITE_OTHER): Payer: Self-pay | Admitting: *Deleted

## 2018-06-15 DIAGNOSIS — I4891 Unspecified atrial fibrillation: Secondary | ICD-10-CM | POA: Diagnosis not present

## 2018-06-15 DIAGNOSIS — I10 Essential (primary) hypertension: Secondary | ICD-10-CM | POA: Diagnosis not present

## 2018-06-15 DIAGNOSIS — I480 Paroxysmal atrial fibrillation: Secondary | ICD-10-CM | POA: Diagnosis not present

## 2018-06-15 DIAGNOSIS — Z7901 Long term (current) use of anticoagulants: Secondary | ICD-10-CM | POA: Diagnosis not present

## 2018-06-15 NOTE — Telephone Encounter (Signed)
Pt called and stated that she would like to cancel appt due to a recent visit to her cardiologist and will call to r/s appt.

## 2018-06-20 ENCOUNTER — Ambulatory Visit (INDEPENDENT_AMBULATORY_CARE_PROVIDER_SITE_OTHER): Payer: PPO | Admitting: Physical Medicine and Rehabilitation

## 2018-06-22 DIAGNOSIS — Z5181 Encounter for therapeutic drug level monitoring: Secondary | ICD-10-CM | POA: Diagnosis not present

## 2018-06-22 DIAGNOSIS — Z7901 Long term (current) use of anticoagulants: Secondary | ICD-10-CM | POA: Diagnosis not present

## 2018-06-22 DIAGNOSIS — I4891 Unspecified atrial fibrillation: Secondary | ICD-10-CM | POA: Diagnosis not present

## 2018-06-22 DIAGNOSIS — M7751 Other enthesopathy of right foot: Secondary | ICD-10-CM | POA: Diagnosis not present

## 2018-06-29 DIAGNOSIS — I480 Paroxysmal atrial fibrillation: Secondary | ICD-10-CM | POA: Diagnosis not present

## 2018-06-29 DIAGNOSIS — M7989 Other specified soft tissue disorders: Secondary | ICD-10-CM | POA: Diagnosis not present

## 2018-06-29 DIAGNOSIS — Z9889 Other specified postprocedural states: Secondary | ICD-10-CM | POA: Diagnosis not present

## 2018-06-29 DIAGNOSIS — R079 Chest pain, unspecified: Secondary | ICD-10-CM | POA: Diagnosis not present

## 2018-06-29 DIAGNOSIS — Z7901 Long term (current) use of anticoagulants: Secondary | ICD-10-CM | POA: Diagnosis not present

## 2018-06-29 DIAGNOSIS — R609 Edema, unspecified: Secondary | ICD-10-CM | POA: Diagnosis not present

## 2018-06-29 DIAGNOSIS — Z886 Allergy status to analgesic agent status: Secondary | ICD-10-CM | POA: Diagnosis not present

## 2018-06-29 DIAGNOSIS — I10 Essential (primary) hypertension: Secondary | ICD-10-CM | POA: Diagnosis not present

## 2018-07-05 DIAGNOSIS — I480 Paroxysmal atrial fibrillation: Secondary | ICD-10-CM | POA: Diagnosis not present

## 2018-07-05 DIAGNOSIS — R9431 Abnormal electrocardiogram [ECG] [EKG]: Secondary | ICD-10-CM | POA: Diagnosis not present

## 2018-07-06 DIAGNOSIS — R0602 Shortness of breath: Secondary | ICD-10-CM | POA: Diagnosis not present

## 2018-07-06 DIAGNOSIS — R9431 Abnormal electrocardiogram [ECG] [EKG]: Secondary | ICD-10-CM | POA: Diagnosis not present

## 2018-07-06 DIAGNOSIS — K219 Gastro-esophageal reflux disease without esophagitis: Secondary | ICD-10-CM | POA: Diagnosis not present

## 2018-07-08 DIAGNOSIS — J810 Acute pulmonary edema: Secondary | ICD-10-CM | POA: Insufficient documentation

## 2018-07-08 DIAGNOSIS — R0602 Shortness of breath: Secondary | ICD-10-CM | POA: Diagnosis not present

## 2018-07-08 DIAGNOSIS — I10 Essential (primary) hypertension: Secondary | ICD-10-CM | POA: Diagnosis not present

## 2018-07-08 DIAGNOSIS — Z7901 Long term (current) use of anticoagulants: Secondary | ICD-10-CM | POA: Diagnosis not present

## 2018-07-08 DIAGNOSIS — I480 Paroxysmal atrial fibrillation: Secondary | ICD-10-CM | POA: Diagnosis not present

## 2018-07-08 DIAGNOSIS — R609 Edema, unspecified: Secondary | ICD-10-CM | POA: Diagnosis not present

## 2018-07-22 DIAGNOSIS — Z888 Allergy status to other drugs, medicaments and biological substances status: Secondary | ICD-10-CM | POA: Diagnosis not present

## 2018-07-22 DIAGNOSIS — I480 Paroxysmal atrial fibrillation: Secondary | ICD-10-CM | POA: Diagnosis not present

## 2018-07-22 DIAGNOSIS — M7989 Other specified soft tissue disorders: Secondary | ICD-10-CM | POA: Diagnosis not present

## 2018-07-22 DIAGNOSIS — R609 Edema, unspecified: Secondary | ICD-10-CM | POA: Diagnosis not present

## 2018-07-22 DIAGNOSIS — R05 Cough: Secondary | ICD-10-CM | POA: Diagnosis not present

## 2018-07-22 DIAGNOSIS — R1013 Epigastric pain: Secondary | ICD-10-CM | POA: Diagnosis not present

## 2018-07-22 DIAGNOSIS — R0602 Shortness of breath: Secondary | ICD-10-CM | POA: Diagnosis not present

## 2018-07-22 DIAGNOSIS — Z7901 Long term (current) use of anticoagulants: Secondary | ICD-10-CM | POA: Diagnosis not present

## 2018-07-22 DIAGNOSIS — I10 Essential (primary) hypertension: Secondary | ICD-10-CM | POA: Diagnosis not present

## 2018-07-25 ENCOUNTER — Encounter (INDEPENDENT_AMBULATORY_CARE_PROVIDER_SITE_OTHER): Payer: Self-pay | Admitting: Physical Medicine and Rehabilitation

## 2018-07-25 ENCOUNTER — Ambulatory Visit (INDEPENDENT_AMBULATORY_CARE_PROVIDER_SITE_OTHER): Payer: Self-pay

## 2018-07-25 ENCOUNTER — Ambulatory Visit (INDEPENDENT_AMBULATORY_CARE_PROVIDER_SITE_OTHER): Payer: PPO | Admitting: Physical Medicine and Rehabilitation

## 2018-07-25 DIAGNOSIS — G8929 Other chronic pain: Secondary | ICD-10-CM | POA: Diagnosis not present

## 2018-07-25 DIAGNOSIS — M25552 Pain in left hip: Secondary | ICD-10-CM

## 2018-07-25 DIAGNOSIS — M5442 Lumbago with sciatica, left side: Secondary | ICD-10-CM | POA: Diagnosis not present

## 2018-07-25 DIAGNOSIS — M5416 Radiculopathy, lumbar region: Secondary | ICD-10-CM

## 2018-07-25 NOTE — Progress Notes (Signed)
 .  Numeric Pain Rating Scale and Functional Assessment Average Pain 6   In the last MONTH (on 0-10 scale) has pain interfered with the following?  1. General activity like being  able to carry out your everyday physical activities such as walking, climbing stairs, carrying groceries, or moving a chair?  Rating(4)    -Dye Allergies.

## 2018-07-25 NOTE — Progress Notes (Signed)
Tina Baker - 72 y.o. female MRN [id number]  Date of birth: [birth year]  Office Visit Note: Visit Date: 07/25/2018 PCP: Tina Ruddle, MD Referred by: Tina Ruddle, MD  Subjective: Chief Complaint  Patient presents with  . Left Hip - Pain  . Left Leg - Pain  . Left Ankle - Pain   HPI: Tina Baker is a very pleasant 72 year old female who was kindly sent here by Tina Baker for epidural injection a little over 1 year ago.  At that time we completed bilateral L4 transforaminal injection with some relief of her symptoms. Briefly, Tina Baker has experienced lower back pain for many years. She had lumbar surgery by Tina Baker in 2010  To try to resolve her low back and left radicular type pain that she was having. She reports that she was better after surgery but still never really got to the point where she had no pain in the left hip and leg. She has pain mainly in the buttock and left side of the leg in L5 distribution but she denies numbness and tingling.  Last time we saw her we completed greater trochanteric injection.  This was in February and gave her quite a bit of relief with pain over the greater trochanter.  She was complaining of pain laying on the side at night and having difficulty sleeping.  Today she is complaining of lower back pain pain referring to the posterior lateral left hip and into the left leg she also gets some referral pain to the ankle.  She really feels like her worst pain is the greater trochanteric pain that was relieved with the injection last time.  This started several months ago and is just worsened over time.  She rates her pain as a 6 out of 10.  She denies any paresthesias or focal weakness.  No new trauma.  No other red flag complaints.  Her case is complicated by coronary artery disease, type 2 diabetes as well as depression anxiety and and she is on Eliquis anticoagulation.  She denies any groin pain.   Review of Systems  Constitutional: Negative for  chills, fever, malaise/fatigue and weight loss.  HENT: Negative for hearing loss and sinus pain.   Eyes: Negative for blurred vision, double vision and photophobia.  Respiratory: Negative for cough and shortness of breath.   Cardiovascular: Negative for chest pain, palpitations and leg swelling.  Gastrointestinal: Negative for abdominal pain, nausea and vomiting.  Genitourinary: Negative for flank pain.  Musculoskeletal: Positive for back pain and joint pain. Negative for myalgias.       Left hip and leg pain  Skin: Negative for itching and rash.  Neurological: Negative for tremors, focal weakness and weakness.  Endo/Heme/Allergies: Negative.   Psychiatric/Behavioral: Negative for depression.  All other systems reviewed and are negative.  Otherwise per HPI.  Assessment & Plan: Visit Diagnoses:  1. Chronic bilateral low back pain with left-sided sciatica   2. Pain in left hip   3. Lumbar radiculopathy     Plan: Findings:  Chronic pain syndrome and postlaminectomy syndrome with chronic recalcitrant left hip and leg pain consistent both with a greater trochanteric pain syndrome as well as L4-L5 radicular symptoms down the leg which is chronic.  This is really more of an L5 distribution overall.  Denies any tingling numbness or any new changes just worsening symptoms overall.  No right-sided complaints.  Exam consistent with both facet mediated pain for her back as well as greater trochanteric pain.  Prior lumbar surgery with continued symptoms.  At this point I think greater trochanteric injection is warranted this is helped in the past and we did this back in February with good relief for a few months.  We talked about regrouping with physical therapy and foam rollers etc.  She is going to try formal work.  If this does not seem to help with any go ahead and schedule a transforaminal epidural injection as she is gotten relief with that in the past as well.  She has had fairly recent MRI over the  last couple years.  Consideration should be given to spinal cord stimulator trial although it would be a little bit difficult on the anticoagulation.  Greater than 50% of this visit (total duration of visit, ) was spent in counseling and coordination of care discussing lumbar radiculopathy versus greater trochanteric bursa type pain and the reasons for having either one.  We also talked about spinal cord stimulation trial.    Meds & Orders: No orders of the defined types were placed in this encounter.   Orders Placed This Encounter  Procedures  . Large Joint Inj: L hip joint  . XR C-ARM NO REPORT    Follow-up: Return in about 2 weeks (around 08/08/2018) for Left L4 or L5 transforaminal epidural.   Procedures: Large Joint Inj: L hip joint on 07/25/2018 11:39 AM Indications: pain and diagnostic evaluation Details: 22 G 3.5 in needle, lateral approach  Arthrogram: No  Medications: 4 mL lidocaine 2 %; 80 mg triamcinolone acetonide 40 MG/ML; 4 mL bupivacaine 0.25 % Outcome: tolerated well, no immediate complications  Greatest area of pain over the greater trochanter was palpated and marked prior to injection. The patient did seem to have relief after the injection. Procedure, treatment alternatives, risks and benefits explained, specific risks discussed. Consent was given by the patient. Immediately prior to procedure a time out was called to verify the correct patient, procedure, equipment, support staff and site/side marked as required. Patient was prepped and draped in the usual sterile fashion.      No notes on file   Clinical History: MRI LUMBAR SPINE WITHOUT CONTRAST 03/12/2017   COMPARISON: 03/01/2012  FINDINGS: Segmentation: Standard.  Alignment: Chronic grade 1 anterolisthesis at L4-5, facet mediated.  Vertebrae: Interval but chronic Schmorl's nodes at L1, L3, and L4. No acute fracture, discitis, or aggressive bone lesion.  Conus medullaris: Extends to the L1-2 disc level  and appears normal.  Paraspinal and other soft tissues: Negative  Disc levels:  T12- L1: Unremarkable.  L1-L2: Progressed disc narrowing with minor annulus bulging. Negative facets. No impingement  L2-L3: Progressed disc narrowing with minimal annulus bulging. Minor facet spurring. No impingement  L3-L4: Chronic disc height loss and central broad disc protrusion. Facet hypertrophy and ligament thickening that is mildly progressed. Progressed bilateral subarticular recess stenosis without static L4 compression. Patent foramina  L4-L5: Advanced facet arthropathy with chronic anterolisthesis. Progressed degenerative disc narrowing with mild bulging and a central disc protrusion. The disc protrusion has regressed since prior. Spinal stenosis is improved but moderate. Bilateral subarticular recess impingement is improved. Foraminal distortion and narrowing without L4 compression.  L5-S1:Advanced degenerative disc narrowing with endplate ridging. Bilateral facet spurring. No impingement  IMPRESSION: 1. L4-5 moderate spinal and bilateral subarticular recess stenosis that is improved compared to 2013 due to regressed disc protrusion. 2. Elsewhere, there has been generalized mild progression of spinal degeneration when compared to 2013. 3. L3-4 mild bilateral subarticular recess stenosis.   Electronically Signed  By: Monte Fantasia M.D. 03/12/2017   She reports that she has never smoked. She has never used smokeless tobacco. No results for input(s): HGBA1C, LABURIC in the last 8760 hours.  Objective:  VS:  HT:    WT:   BMI:     BP:   HR: bpm  TEMP: ( )  RESP:  Physical Exam  Constitutional: She is oriented to person, place, and time. She appears well-developed and well-nourished. No distress.  HENT:  Head: Normocephalic and atraumatic.  Nose: Nose normal.  Mouth/Throat: Oropharynx is clear and moist.  Eyes: Pupils are equal, round, and reactive to light. Conjunctivae are  normal.  Neck: Normal range of motion. Neck supple.  Cardiovascular: Regular rhythm and intact distal pulses.  Pulmonary/Chest: Effort normal. No respiratory distress.  Abdominal: She exhibits no distension. There is no guarding.  Musculoskeletal:  Patient is somewhat slow to rise from a seated position to full standing position.  She is tender to palpation in the paraspinal region but no pain with rocking over the vertebral body.  She does have pain with facet joint loading.  No pain with hip rotation.  Some tenderness to the left PSIS and sciatic region.  She has a negative slump test bilaterally she has good distal strength without clonus.  She is exquisitely tender over the left greater trochanter compared to right.  Neurological: She is alert and oriented to person, place, and time. She exhibits normal muscle tone. Coordination normal.  Skin: Skin is warm. No rash noted. No erythema.  Psychiatric: She has a normal mood and affect. Her behavior is normal.  Nursing note and vitals reviewed.   Ortho Exam Imaging: No results found.  Past Medical/Family/Surgical/Social History: Medications & Allergies reviewed per EMR, new medications updated. Patient Active Problem List   Diagnosis Date Noted  . Constipation 02/19/2018  . Allergic rhinitis 11/13/2015  . Benign essential hypertension 11/13/2015  . Chronic tension-type headache, not intractable 11/13/2015  . DDD (degenerative disc disease), lumbosacral 11/13/2015  . Gastroesophageal reflux disease without esophagitis 11/13/2015  . Paroxysmal atrial fibrillation ([place]) 11/13/2015  . RLS (restless legs syndrome) 11/13/2015  . Vitamin D deficiency 11/13/2015  . Type 2 diabetes mellitus, without long-term current use of insulin ([place]) 06/25/2015  . Venous insufficiency, peripheral 01/09/2015   Past Medical History:  Diagnosis Date  . Arthritis   . Atrial fibrillation ([place])   . Diabetes mellitus   . Hyperlipidemia   . Hypertension     Family History  Problem Relation Age of Onset  . Cancer Mother 108       leukemia   . Heart disease Father 56       heart attack   . Heart disease Sister    Past Surgical History:  Procedure Laterality Date  . ABDOMINAL HYSTERECTOMY  2001  . BUNIONECTOMY Bilateral 1979  . GALLBLADDER SURGERY    . SPINE SURGERY  2010   protruding disc    Social History   Occupational History  . Not on file  Tobacco Use  . Smoking status: Never Smoker  . Smokeless tobacco: Never Used  Substance and Sexual Activity  . Alcohol use: No  . Drug use: No  . Sexual activity: Not on file

## 2018-07-29 DIAGNOSIS — G4730 Sleep apnea, unspecified: Secondary | ICD-10-CM | POA: Diagnosis not present

## 2018-08-01 DIAGNOSIS — R1013 Epigastric pain: Secondary | ICD-10-CM | POA: Diagnosis not present

## 2018-08-01 DIAGNOSIS — G4709 Other insomnia: Secondary | ICD-10-CM | POA: Diagnosis not present

## 2018-08-01 DIAGNOSIS — Z7901 Long term (current) use of anticoagulants: Secondary | ICD-10-CM | POA: Diagnosis not present

## 2018-08-01 DIAGNOSIS — Z79899 Other long term (current) drug therapy: Secondary | ICD-10-CM | POA: Diagnosis not present

## 2018-08-01 DIAGNOSIS — I4891 Unspecified atrial fibrillation: Secondary | ICD-10-CM | POA: Diagnosis not present

## 2018-08-02 ENCOUNTER — Encounter (INDEPENDENT_AMBULATORY_CARE_PROVIDER_SITE_OTHER): Payer: Self-pay | Admitting: Physical Medicine and Rehabilitation

## 2018-08-02 MED ORDER — TRIAMCINOLONE ACETONIDE 40 MG/ML IJ SUSP
80.0000 mg | INTRAMUSCULAR | Status: AC | PRN
  Administered 2018-07-25: 80 mg via INTRA_ARTICULAR

## 2018-08-02 MED ORDER — BUPIVACAINE HCL 0.25 % IJ SOLN
4.0000 mL | INTRAMUSCULAR | Status: AC | PRN
  Administered 2018-07-25: 4 mL via INTRA_ARTICULAR

## 2018-08-02 MED ORDER — LIDOCAINE HCL 2 % IJ SOLN
4.0000 mL | INTRAMUSCULAR | Status: AC | PRN
  Administered 2018-07-25: 4 mL

## 2018-08-12 ENCOUNTER — Ambulatory Visit (INDEPENDENT_AMBULATORY_CARE_PROVIDER_SITE_OTHER): Payer: Self-pay

## 2018-08-12 ENCOUNTER — Ambulatory Visit (INDEPENDENT_AMBULATORY_CARE_PROVIDER_SITE_OTHER): Payer: PPO | Admitting: Physical Medicine and Rehabilitation

## 2018-08-12 VITALS — BP 139/65 | HR 64 | Temp 98.0°F

## 2018-08-12 DIAGNOSIS — M5416 Radiculopathy, lumbar region: Secondary | ICD-10-CM

## 2018-08-12 DIAGNOSIS — I10 Essential (primary) hypertension: Secondary | ICD-10-CM | POA: Diagnosis not present

## 2018-08-12 DIAGNOSIS — R609 Edema, unspecified: Secondary | ICD-10-CM | POA: Diagnosis not present

## 2018-08-12 DIAGNOSIS — I480 Paroxysmal atrial fibrillation: Secondary | ICD-10-CM | POA: Diagnosis not present

## 2018-08-12 DIAGNOSIS — G4733 Obstructive sleep apnea (adult) (pediatric): Secondary | ICD-10-CM | POA: Diagnosis not present

## 2018-08-12 DIAGNOSIS — Z7901 Long term (current) use of anticoagulants: Secondary | ICD-10-CM | POA: Diagnosis not present

## 2018-08-12 MED ORDER — BETAMETHASONE SOD PHOS & ACET 6 (3-3) MG/ML IJ SUSP
12.0000 mg | Freq: Once | INTRAMUSCULAR | Status: AC
  Administered 2018-08-12: 12 mg

## 2018-08-12 NOTE — Patient Instructions (Signed)
[employer] Discharge Instructions  *At any time if you have questions or concerns they can be answered by calling [phone]  All Patients: . You may experience an increase in your symptoms for the first 2 days (it can take 2 days to 2 weeks for the steroid/cortisone to have its maximal effect). . You may use ice to the site for the first 24 hours; 20 minutes on and 20 minutes off and may use heat after that time. . You may resume and continue your current pain medications. If you need a refill please contact the prescribing physician. . You may resume her regular medications if any were stopped for the procedure. . You may shower but no swimming, tub bath or Jacuzzi for 24 hours. . Please remove bandage after 4 hours. . You may resume light activities as tolerated. . If you had Spine Injection, you should not drive for the next 3 hours due to anesthetics used in the procedure. Please have someone drive for you.  *If you have had sedation, Valium, Xanax, or lorazepam: Do not drive or use public transportation for 24 hours, do not operating hazardous machinery or make important personal/business decisions for 24 hours.  POSSIBLE STEROID SIDE EFFECTS: If experienced these should only last for a short period. Change in menstrual flow  Edema in (swelling)  Increased appetite Skin flushing (redness)  Skin rash/acne  Thrush (oral) Vaginitis    Increased sweating  Depression Increased blood glucose levels Cramping and leg/calf  Euphoria (feeling happy)  POSSIBLE PROCEDURE SIDE EFFECTS: Please call our office if concerned. Increased pain Increased numbness/tingling  Headache Nausea/vomiting Hematoma (bruising/bleeding) Edema (swelling at the site) Weakness  Infection (red/drainage at site) Fever greater than 100.7F  *In the event of a headache after epidural steroid injection: Drink plenty of fluids, especially water and try to lay flat when possible. If the headache does  not get better after a few days or as always if concerned please call the office.

## 2018-08-12 NOTE — Progress Notes (Signed)
  Numeric Pain Rating Scale and Functional Assessment Average Pain 8   In the last MONTH (on 0-10 scale) has pain interfered with the following?  1. General activity like being  able to carry out your everyday physical activities such as walking, climbing stairs, carrying groceries, or moving a chair?  Rating(4)   +Driver, BT (warfarin), -Dye Allergies.

## 2018-08-14 DIAGNOSIS — Z79899 Other long term (current) drug therapy: Secondary | ICD-10-CM | POA: Diagnosis not present

## 2018-08-14 DIAGNOSIS — F419 Anxiety disorder, unspecified: Secondary | ICD-10-CM | POA: Diagnosis not present

## 2018-08-14 DIAGNOSIS — Z7984 Long term (current) use of oral hypoglycemic drugs: Secondary | ICD-10-CM | POA: Diagnosis not present

## 2018-08-14 DIAGNOSIS — I10 Essential (primary) hypertension: Secondary | ICD-10-CM | POA: Diagnosis not present

## 2018-08-14 DIAGNOSIS — S0990XA Unspecified injury of head, initial encounter: Secondary | ICD-10-CM | POA: Diagnosis not present

## 2018-08-14 DIAGNOSIS — I4891 Unspecified atrial fibrillation: Secondary | ICD-10-CM | POA: Diagnosis not present

## 2018-08-14 DIAGNOSIS — Z7951 Long term (current) use of inhaled steroids: Secondary | ICD-10-CM | POA: Diagnosis not present

## 2018-08-14 DIAGNOSIS — F329 Major depressive disorder, single episode, unspecified: Secondary | ICD-10-CM | POA: Diagnosis not present

## 2018-08-14 DIAGNOSIS — S0181XA Laceration without foreign body of other part of head, initial encounter: Secondary | ICD-10-CM | POA: Diagnosis not present

## 2018-08-14 DIAGNOSIS — G44319 Acute post-traumatic headache, not intractable: Secondary | ICD-10-CM | POA: Diagnosis not present

## 2018-08-17 DIAGNOSIS — S0181XD Laceration without foreign body of other part of head, subsequent encounter: Secondary | ICD-10-CM | POA: Diagnosis not present

## 2018-08-19 DIAGNOSIS — Z4802 Encounter for removal of sutures: Secondary | ICD-10-CM | POA: Diagnosis not present

## 2018-08-29 NOTE — Progress Notes (Signed)
Tina Baker - 72 y.o. female MRN [id number]  Date of birth: 09/10/1946  Office Visit Note: Visit Date: 08/12/2018 PCP: Tina Ruddle, MD Referred by: Tina Ruddle, MD  Subjective: Chief Complaint  Patient presents with  . Lower Back - Pain   HPI:  Tina Baker is a 72 y.o. female who comes in today For planned possible left L5 transforaminal epidural steroid injection for what his left lateral hip and leg pain.  We have seen her in the past for both L4 injection as well as greater trochanteric injection.  She is done really better with the greater trochanteric injection and we have talked about conservative care in the last note can be reviewed fully on this.  She got about 1 week of relief with the fluoroscopically guided greater trochanteric injection.  Next step is left L5 transforaminal injection.  If just really not getting where she is wanting to be at that point would have to look at a combination of any changes in medication or regrouping with physical therapy or possibly looking at spinal cord stimulator trial.  ROS Otherwise per HPI.  Assessment & Plan: Visit Diagnoses:  1. Lumbar radiculopathy     Plan: No additional findings.   Meds & Orders:  Meds ordered this encounter  Medications  . betamethasone acetate-betamethasone sodium phosphate (CELESTONE) injection 12 mg    Orders Placed This Encounter  Procedures  . XR C-ARM NO REPORT  . Epidural Steroid injection    Follow-up: Return if symptoms worsen or fail to improve.   Procedures: No procedures performed  Lumbosacral Transforaminal Epidural Steroid Injection - Sub-Pedicular Approach with Fluoroscopic Guidance  Patient: Tina Baker      Date of Birth: 05/08/1946 MRN: [id number] PCP: Tina Ruddle, MD      Visit Date: 08/12/2018   Universal Protocol:    Date/Time: 08/12/2018  Consent Given By: the patient  Position: PRONE  Additional Comments: Vital signs were monitored before and  after the procedure. Patient was prepped and draped in the usual sterile fashion. The correct patient, procedure, and site was verified.   Injection Procedure Details:  Procedure Site One Meds Administered:  Meds ordered this encounter  Medications  . betamethasone acetate-betamethasone sodium phosphate (CELESTONE) injection 12 mg    Laterality: Left  Location/Site:  L5-S1  Needle size: 22 G  Needle type: Spinal  Needle Placement: Transforaminal  Findings:    -Comments: Excellent flow of contrast along the nerve and into the epidural space.  Procedure Details: After squaring off the end-plates to get a true AP view, the C-arm was positioned so that an oblique view of the foramen as noted above was visualized. The target area is just inferior to the "nose of the scotty dog" or sub pedicular. The soft tissues overlying this structure were infiltrated with 2-3 ml. of 1% Lidocaine without Epinephrine.  The spinal needle was inserted toward the target using a "trajectory" view along the fluoroscope beam.  Under AP and lateral visualization, the needle was advanced so it did not puncture dura and was located close the 6 O'Clock position of the pedical in AP tracterory. Biplanar projections were used to confirm position. Aspiration was confirmed to be negative for CSF and/or blood. A 1-2 ml. volume of Isovue-250 was injected and flow of contrast was noted at each level. Radiographs were obtained for documentation purposes.   After attaining the desired flow of contrast documented above, a 0.5 to 1.0 ml test dose of 0.25% Marcaine was  injected into each respective transforaminal space.  The patient was observed for 90 seconds post injection.  After no sensory deficits were reported, and normal lower extremity motor function was noted,   the above injectate was administered so that equal amounts of the injectate were placed at each foramen (level) into the transforaminal epidural  space.   Additional Comments:  The patient tolerated the procedure well Dressing: Band-Aid    Post-procedure details: Patient was observed during the procedure. Post-procedure instructions were reviewed.  Patient left the clinic in stable condition.    Clinical History: MRI LUMBAR SPINE WITHOUT CONTRAST 03/12/2017   COMPARISON: 03/01/2012  FINDINGS: Segmentation: Standard.  Alignment: Chronic grade 1 anterolisthesis at L4-5, facet mediated.  Vertebrae: Interval but chronic Schmorl's nodes at L1, L3, and L4. No acute fracture, discitis, or aggressive bone lesion.  Conus medullaris: Extends to the L1-2 disc level and appears normal.  Paraspinal and other soft tissues: Negative  Disc levels:  T12- L1: Unremarkable.  L1-L2: Progressed disc narrowing with minor annulus bulging. Negative facets. No impingement  L2-L3: Progressed disc narrowing with minimal annulus bulging. Minor facet spurring. No impingement  L3-L4: Chronic disc height loss and central broad disc protrusion. Facet hypertrophy and ligament thickening that is mildly progressed. Progressed bilateral subarticular recess stenosis without static L4 compression. Patent foramina  L4-L5: Advanced facet arthropathy with chronic anterolisthesis. Progressed degenerative disc narrowing with mild bulging and a central disc protrusion. The disc protrusion has regressed since prior. Spinal stenosis is improved but moderate. Bilateral subarticular recess impingement is improved. Foraminal distortion and narrowing without L4 compression.  L5-S1:Advanced degenerative disc narrowing with endplate ridging. Bilateral facet spurring. No impingement  IMPRESSION: 1. L4-5 moderate spinal and bilateral subarticular recess stenosis that is improved compared to 2013 due to regressed disc protrusion. 2. Elsewhere, there has been generalized mild progression of spinal degeneration when compared to 2013. 3. L3-4 mild  bilateral subarticular recess stenosis.   Electronically Signed By: Monte Fantasia M.D. 03/12/2017     Objective:  VS:  HT:    WT:   BMI:     BP:139/65  HR:64bpm  TEMP:98 F (36.7 C)( )  RESP:96 % Physical Exam  Ortho Exam Imaging: No results found.

## 2018-08-29 NOTE — Procedures (Signed)
Lumbosacral Transforaminal Epidural Steroid Injection - Sub-Pedicular Approach with Fluoroscopic Guidance  Patient: Tina Baker      Date of Birth: 07/14/1946 MRN: [id number] PCP: Dyann Ruddle, MD      Visit Date: 08/12/2018   Universal Protocol:    Date/Time: 08/12/2018  Consent Given By: the patient  Position: PRONE  Additional Comments: Vital signs were monitored before and after the procedure. Patient was prepped and draped in the usual sterile fashion. The correct patient, procedure, and site was verified.   Injection Procedure Details:  Procedure Site One Meds Administered:  Meds ordered this encounter  Medications  . betamethasone acetate-betamethasone sodium phosphate (CELESTONE) injection 12 mg    Laterality: Left  Location/Site:  L5-S1  Needle size: 22 G  Needle type: Spinal  Needle Placement: Transforaminal  Findings:    -Comments: Excellent flow of contrast along the nerve and into the epidural space.  Procedure Details: After squaring off the end-plates to get a true AP view, the C-arm was positioned so that an oblique view of the foramen as noted above was visualized. The target area is just inferior to the "nose of the scotty dog" or sub pedicular. The soft tissues overlying this structure were infiltrated with 2-3 ml. of 1% Lidocaine without Epinephrine.  The spinal needle was inserted toward the target using a "trajectory" view along the fluoroscope beam.  Under AP and lateral visualization, the needle was advanced so it did not puncture dura and was located close the 6 O'Clock position of the pedical in AP tracterory. Biplanar projections were used to confirm position. Aspiration was confirmed to be negative for CSF and/or blood. A 1-2 ml. volume of Isovue-250 was injected and flow of contrast was noted at each level. Radiographs were obtained for documentation purposes.   After attaining the desired flow of contrast documented above, a 0.5 to  1.0 ml test dose of 0.25% Marcaine was injected into each respective transforaminal space.  The patient was observed for 90 seconds post injection.  After no sensory deficits were reported, and normal lower extremity motor function was noted,   the above injectate was administered so that equal amounts of the injectate were placed at each foramen (level) into the transforaminal epidural space.   Additional Comments:  The patient tolerated the procedure well Dressing: Band-Aid    Post-procedure details: Patient was observed during the procedure. Post-procedure instructions were reviewed.  Patient left the clinic in stable condition.

## 2018-08-30 DIAGNOSIS — G4733 Obstructive sleep apnea (adult) (pediatric): Secondary | ICD-10-CM | POA: Diagnosis not present

## 2018-08-30 DIAGNOSIS — R0602 Shortness of breath: Secondary | ICD-10-CM | POA: Diagnosis not present

## 2018-09-05 DIAGNOSIS — M7751 Other enthesopathy of right foot: Secondary | ICD-10-CM | POA: Diagnosis not present

## 2018-09-09 ENCOUNTER — Telehealth (INDEPENDENT_AMBULATORY_CARE_PROVIDER_SITE_OTHER): Payer: Self-pay | Admitting: Physical Medicine and Rehabilitation

## 2018-09-09 NOTE — Telephone Encounter (Signed)
Bursa injection ok and we can discuss, 30 min

## 2018-09-12 NOTE — Telephone Encounter (Signed)
Left message for patient to call back to schedule.  °

## 2018-09-12 NOTE — Telephone Encounter (Signed)
Scheduled for 11/26 at 1030.

## 2018-09-13 DIAGNOSIS — Z7901 Long term (current) use of anticoagulants: Secondary | ICD-10-CM | POA: Diagnosis not present

## 2018-09-19 DIAGNOSIS — G4733 Obstructive sleep apnea (adult) (pediatric): Secondary | ICD-10-CM | POA: Diagnosis not present

## 2018-09-19 DIAGNOSIS — Z7901 Long term (current) use of anticoagulants: Secondary | ICD-10-CM | POA: Diagnosis not present

## 2018-09-22 ENCOUNTER — Ambulatory Visit (INDEPENDENT_AMBULATORY_CARE_PROVIDER_SITE_OTHER): Payer: Self-pay

## 2018-09-22 ENCOUNTER — Encounter (INDEPENDENT_AMBULATORY_CARE_PROVIDER_SITE_OTHER): Payer: Self-pay | Admitting: Physical Medicine and Rehabilitation

## 2018-09-22 ENCOUNTER — Ambulatory Visit (INDEPENDENT_AMBULATORY_CARE_PROVIDER_SITE_OTHER): Payer: PPO | Admitting: Physical Medicine and Rehabilitation

## 2018-09-22 VITALS — BP 163/74 | HR 73

## 2018-09-22 DIAGNOSIS — M7062 Trochanteric bursitis, left hip: Secondary | ICD-10-CM

## 2018-09-22 DIAGNOSIS — M792 Neuralgia and neuritis, unspecified: Secondary | ICD-10-CM

## 2018-09-22 MED ORDER — LIDOCAINE 1.8 % EX PTCH: 1.0000 | MEDICATED_PATCH | CUTANEOUS | 0 refills | Status: AC

## 2018-09-22 NOTE — Progress Notes (Signed)
  Numeric Pain Rating Scale and Functional Assessment Average Pain 8   In the last MONTH (on 0-10 scale) has pain interfered with the following?  1. General activity like being  able to carry out your everyday physical activities such as walking, climbing stairs, carrying groceries, or moving a chair?  Rating(5)   + Coumadin, -Dye Allergies.

## 2018-09-22 NOTE — Patient Instructions (Signed)
[employer] Discharge Instructions  *At any time if you have questions or concerns they can be answered by calling [phone]  All Patients: . You may experience an increase in your symptoms for the first 2 days (it can take 2 days to 2 weeks for the steroid/cortisone to have its maximal effect). . You may use ice to the site for the first 24 hours; 20 minutes on and 20 minutes off and may use heat after that time. . You may resume and continue your current pain medications. If you need a refill please contact the prescribing physician. . You may resume your medications if any were stopped for the procedure. . You may shower but no swimming, tub bath or Jacuzzi for 24 hours. . Please remove bandage after 4 hours. . You may resume light activities as tolerated. . If you had Spine Injection, you should not drive for the next 3 hours due to anesthetics used in the procedure. Please have someone drive for you.  *If you have had sedation, Valium, Xanax, or lorazepam: Do not drive or use public transportation for 24 hours, do not operating hazardous machinery or make important personal/business decisions for 24 hours.  POSSIBLE STEROID SIDE EFFECTS: If experienced these should only last for a short period. Change in menstrual flow  Edema in (swelling)  Increased appetite Skin flushing (redness)  Skin rash/acne  Thrush (oral) Vaginitis    Increased sweating  Depression Increased blood glucose levels Cramping and leg/calf  Euphoria (feeling happy)  POSSIBLE PROCEDURE SIDE EFFECTS: Please call our office if concerned. Increased pain Increased numbness/tingling  Headache Nausea/vomiting Hematoma (bruising/bleeding) Edema (swelling at the site) Weakness  Infection (red/drainage at site) Fever greater than 100.72F  *In the event of a headache after epidural steroid injection: Drink plenty of fluids, especially water and try to lay flat when possible. If the headache does not get  better after a few days or as always if concerned please call the office.

## 2018-09-22 NOTE — Progress Notes (Signed)
Tina Baker - 72 y.o. female MRN [id number]  Date of birth: 11/29/1945  Office Visit Note: Visit Date: 09/22/2018 PCP: Dyann Ruddle, MD Referred by: Dyann Ruddle, MD  Subjective: Chief Complaint  Patient presents with  . Lower Back - Pain  . Left Hip - Pain   HPI: Tina Baker is a 72 y.o. female who comes in today For planned left greater trochanteric injection.  Patient has had prior epidural injection without much relief and continues to have left lateral hip pain into the thigh.  Depending on relief would look at updating MRI of the lumbar spine.  ROS Otherwise per HPI.  Assessment & Plan: Visit Diagnoses:  1. Greater trochanteric bursitis, left   2. Neuritis     Plan: No additional findings.   Meds & Orders:  Meds ordered this encounter  Medications  . Lidocaine (ZTLIDO) 1.8 % PTCH    Sig: Apply 1 patch topically 1 day or 1 dose for 1 dose.    Dispense:  30 patch    Refill:  0    Orders Placed This Encounter  Procedures  . Large Joint Inj: L greater trochanter  . XR C-ARM NO REPORT    Follow-up: No follow-ups on file.   Procedures: Large Joint Inj: L greater trochanter on 09/22/2018 2:25 PM Indications: pain and diagnostic evaluation Details: 22 G 3.5 in needle, fluoroscopy-guided lateral approach  Arthrogram: No  Medications: 4 mL lidocaine 2 %; 80 mg triamcinolone acetonide 40 MG/ML; 4 mL bupivacaine 0.25 % Outcome: tolerated well, no immediate complications  There was excellent flow of contrast outlined the greater trochanteric bursa without vascular uptake. Procedure, treatment alternatives, risks and benefits explained, specific risks discussed. Consent was given by the patient. Immediately prior to procedure a time out was called to verify the correct patient, procedure, equipment, support staff and site/side marked as required. Patient was prepped and draped in the usual sterile fashion.      No notes on file   Clinical History: MRI  LUMBAR SPINE WITHOUT CONTRAST  TECHNIQUE: Multiplanar, multisequence MR imaging of the lumbar spine was performed. No intravenous contrast was administered.  COMPARISON:  CT Abdomen and Pelvis 02/19/2018. [facility] lumbar MRI 03/12/2017.  FINDINGS: Segmentation:  Normal on the CT earlier this year.  Alignment: Stable since 2018. Chronic grade 1 anterolisthesis of L4 on L5 with straightening or mild reversal of upper lumbar lordosis.  Vertebrae: Chronic degenerative endplate changes. No marrow edema or evidence of acute osseous abnormality. Visualized bone marrow signal is within normal limits. Intact visible sacrum and SI joints.  Conus medullaris and cauda equina: Conus extends to the L1 level. No lower spinal cord or conus signal abnormality.  Paraspinal and other soft tissues: Chronic hepatomegaly. Stable visible abdominal viscera. Stable paraspinal soft tissues, chronic postoperative changes to the posterior paraspinal soft tissues L3-L4 through L5-S1.  Disc levels:  No lower thoracic spinal stenosis. There is chronic disc bulging and posterior element hypertrophy at T11-T12.  T12-L1:  Negative.  L1-L2:  Mild disc bulge.  No stenosis.  L2-L3: Chronic circumferential disc bulge with progressed broad-based posterior component since 2018. Mild facet hypertrophy. Increased mild spinal stenosis (series 6, image 13).  L3-L4: Chronic circumferential disc bulge and broad-based posterior disc protrusion with moderate facet and ligament flavum hypertrophy. Chronic postoperative changes to the lamina. Mild to moderate spinal stenosis and left greater than right lateral recess stenosis appears mildly increased since 2018. Furthermore, a left foraminal disc extrusion is new since 2018,  best seen on series 3, image 10 and series 5, image 18. Associated severe left L3 foraminal stenosis. Possible 8-9 millimeter extruded disc fragment at the left  foramen.  L4-L5: Chronic anterolisthesis with disc space loss, chronic circumferential disc bulge/pseudo disc, and superimposed chronic broad-based central disc protrusion (series 6, image 25). Severe chronic facet hypertrophy. Postoperative changes to the lamina. Mild to moderate spinal and moderate to severe bilateral lateral recess stenosis. Mild left and moderate right L4 foraminal stenosis. This level is stable since 2018.  L5-S1: Chronic disc space loss and circumferential but mostly far lateral disc osteophyte complex. Moderate facet hypertrophy. Chronic postoperative changes to the lamina. No spinal stenosis. Mild left lateral recess stenosis and bilateral L5 foraminal stenosis. This level is stable.  IMPRESSION: 1. Symptomatic level appears to be L3-L4 where a left foraminal disc extrusion with possible 8-9 mm sequestered disc fragment is new since the 2018 MRI. New severe left foraminal stenosis. Query left L3 radiculitis. Underlying mild to moderate spinal and lateral recess stenosis at that level also appears mildly progressed. 2. Stable chronic degenerative and postoperative changes at L4-L5 and L5-S1. Chronic mild to moderate spinal and moderate to severe bilateral lateral recess stenosis at L4-L5. 3. Mild progression of chronic L2-L3 disc degeneration with increased mild spinal stenosis at that level.   Electronically Signed   By: Genevie Ann M.D.   On: 10/15/2018 19:23   She reports that she has never smoked. She has never used smokeless tobacco. No results for input(s): HGBA1C, LABURIC in the last 8760 hours.  Objective:  VS:  HT:    WT:   BMI:     BP:(!) 163/74  HR:73bpm  TEMP: ( )  RESP:  Physical Exam  Ortho Exam Imaging: Xr C-arm No Report  Result Date: 12/26/2018 Please see Notes tab for imaging impression.   Past Medical/Family/Surgical/Social History: Medications & Allergies reviewed per EMR, new medications updated. Patient Active  Problem List   Diagnosis Date Noted  . Constipation 02/19/2018  . Allergic rhinitis 11/13/2015  . Benign essential hypertension 11/13/2015  . Chronic tension-type headache, not intractable 11/13/2015  . DDD (degenerative disc disease), lumbosacral 11/13/2015  . Gastroesophageal reflux disease without esophagitis 11/13/2015  . Paroxysmal atrial fibrillation ([place]) 11/13/2015  . RLS (restless legs syndrome) 11/13/2015  . Vitamin D deficiency 11/13/2015  . Type 2 diabetes mellitus, without long-term current use of insulin ([place]) 06/25/2015  . Venous insufficiency, peripheral 01/09/2015   Past Medical History:  Diagnosis Date  . Arthritis   . Atrial fibrillation ([place])   . Diabetes mellitus   . Hyperlipidemia   . Hypertension    Family History  Problem Relation Age of Onset  . Cancer Mother 67       leukemia   . Heart disease Father 52       heart attack   . Heart disease Sister    Past Surgical History:  Procedure Laterality Date  . ABDOMINAL HYSTERECTOMY  2001  . BUNIONECTOMY Bilateral 1979  . GALLBLADDER SURGERY    . SPINE SURGERY  2010   protruding disc    Social History   Occupational History  . Not on file  Tobacco Use  . Smoking status: Never Smoker  . Smokeless tobacco: Never Used  Substance and Sexual Activity  . Alcohol use: No  . Drug use: No  . Sexual activity: Not on file

## 2018-09-26 DIAGNOSIS — Z7901 Long term (current) use of anticoagulants: Secondary | ICD-10-CM | POA: Diagnosis not present

## 2018-09-27 ENCOUNTER — Ambulatory Visit (INDEPENDENT_AMBULATORY_CARE_PROVIDER_SITE_OTHER): Payer: PPO | Admitting: Physical Medicine and Rehabilitation

## 2018-09-30 DIAGNOSIS — S79912A Unspecified injury of left hip, initial encounter: Secondary | ICD-10-CM | POA: Diagnosis not present

## 2018-09-30 DIAGNOSIS — M25552 Pain in left hip: Secondary | ICD-10-CM | POA: Diagnosis not present

## 2018-10-03 ENCOUNTER — Telehealth (INDEPENDENT_AMBULATORY_CARE_PROVIDER_SITE_OTHER): Payer: Self-pay | Admitting: Physical Medicine and Rehabilitation

## 2018-10-03 DIAGNOSIS — M5442 Lumbago with sciatica, left side: Secondary | ICD-10-CM

## 2018-10-03 DIAGNOSIS — Z7901 Long term (current) use of anticoagulants: Secondary | ICD-10-CM | POA: Diagnosis not present

## 2018-10-03 DIAGNOSIS — G8929 Other chronic pain: Secondary | ICD-10-CM

## 2018-10-03 DIAGNOSIS — M48061 Spinal stenosis, lumbar region without neurogenic claudication: Secondary | ICD-10-CM

## 2018-10-03 DIAGNOSIS — M5416 Radiculopathy, lumbar region: Secondary | ICD-10-CM

## 2018-10-03 NOTE — Telephone Encounter (Signed)
The only thing I know is to repeat MRI lspine, try course of PT look at medication change vs chronic pain mgt with pain clinic, Surgical referral

## 2018-10-03 NOTE — Telephone Encounter (Signed)
Patient would like to start with an updated MRI. Order placed.

## 2018-10-06 DIAGNOSIS — I10 Essential (primary) hypertension: Secondary | ICD-10-CM | POA: Diagnosis not present

## 2018-10-06 DIAGNOSIS — I480 Paroxysmal atrial fibrillation: Secondary | ICD-10-CM | POA: Diagnosis not present

## 2018-10-06 DIAGNOSIS — Z7901 Long term (current) use of anticoagulants: Secondary | ICD-10-CM | POA: Diagnosis not present

## 2018-10-09 DIAGNOSIS — Z888 Allergy status to other drugs, medicaments and biological substances status: Secondary | ICD-10-CM | POA: Diagnosis not present

## 2018-10-09 DIAGNOSIS — M436 Torticollis: Secondary | ICD-10-CM | POA: Diagnosis not present

## 2018-10-09 DIAGNOSIS — Z7951 Long term (current) use of inhaled steroids: Secondary | ICD-10-CM | POA: Diagnosis not present

## 2018-10-09 DIAGNOSIS — I10 Essential (primary) hypertension: Secondary | ICD-10-CM | POA: Diagnosis not present

## 2018-10-09 DIAGNOSIS — M50322 Other cervical disc degeneration at C5-C6 level: Secondary | ICD-10-CM | POA: Diagnosis not present

## 2018-10-09 DIAGNOSIS — Z7982 Long term (current) use of aspirin: Secondary | ICD-10-CM | POA: Diagnosis not present

## 2018-10-09 DIAGNOSIS — M62838 Other muscle spasm: Secondary | ICD-10-CM | POA: Diagnosis not present

## 2018-10-09 DIAGNOSIS — Z7984 Long term (current) use of oral hypoglycemic drugs: Secondary | ICD-10-CM | POA: Diagnosis not present

## 2018-10-09 DIAGNOSIS — Z79899 Other long term (current) drug therapy: Secondary | ICD-10-CM | POA: Diagnosis not present

## 2018-10-09 DIAGNOSIS — I4891 Unspecified atrial fibrillation: Secondary | ICD-10-CM | POA: Diagnosis not present

## 2018-10-13 DIAGNOSIS — Z09 Encounter for follow-up examination after completed treatment for conditions other than malignant neoplasm: Secondary | ICD-10-CM | POA: Diagnosis not present

## 2018-10-13 DIAGNOSIS — M542 Cervicalgia: Secondary | ICD-10-CM | POA: Diagnosis not present

## 2018-10-14 DIAGNOSIS — R110 Nausea: Secondary | ICD-10-CM | POA: Diagnosis not present

## 2018-10-14 DIAGNOSIS — I10 Essential (primary) hypertension: Secondary | ICD-10-CM | POA: Diagnosis not present

## 2018-10-14 DIAGNOSIS — R9431 Abnormal electrocardiogram [ECG] [EKG]: Secondary | ICD-10-CM | POA: Diagnosis not present

## 2018-10-14 DIAGNOSIS — Z7901 Long term (current) use of anticoagulants: Secondary | ICD-10-CM | POA: Diagnosis not present

## 2018-10-14 DIAGNOSIS — R51 Headache: Secondary | ICD-10-CM | POA: Diagnosis not present

## 2018-10-14 DIAGNOSIS — R000 Tachycardia, unspecified: Secondary | ICD-10-CM | POA: Diagnosis not present

## 2018-10-14 DIAGNOSIS — I493 Ventricular premature depolarization: Secondary | ICD-10-CM | POA: Diagnosis not present

## 2018-10-14 DIAGNOSIS — R079 Chest pain, unspecified: Secondary | ICD-10-CM | POA: Diagnosis not present

## 2018-10-14 DIAGNOSIS — R002 Palpitations: Secondary | ICD-10-CM | POA: Diagnosis not present

## 2018-10-15 ENCOUNTER — Ambulatory Visit
Admission: RE | Admit: 2018-10-15 | Discharge: 2018-10-15 | Disposition: A | Discharge status: Home / Self Care | Payer: PPO | Source: Ambulatory Visit | Attending: Physical Medicine and Rehabilitation | Admitting: Physical Medicine and Rehabilitation

## 2018-10-15 ENCOUNTER — Encounter: Payer: Self-pay | Admitting: Radiology

## 2018-10-15 DIAGNOSIS — M5442 Lumbago with sciatica, left side: Secondary | ICD-10-CM

## 2018-10-15 DIAGNOSIS — M48061 Spinal stenosis, lumbar region without neurogenic claudication: Secondary | ICD-10-CM | POA: Diagnosis not present

## 2018-10-15 DIAGNOSIS — G8929 Other chronic pain: Secondary | ICD-10-CM

## 2018-10-15 DIAGNOSIS — M5416 Radiculopathy, lumbar region: Secondary | ICD-10-CM

## 2018-10-16 DIAGNOSIS — R9431 Abnormal electrocardiogram [ECG] [EKG]: Secondary | ICD-10-CM | POA: Diagnosis not present

## 2018-10-16 DIAGNOSIS — I493 Ventricular premature depolarization: Secondary | ICD-10-CM | POA: Diagnosis not present

## 2018-10-16 DIAGNOSIS — R000 Tachycardia, unspecified: Secondary | ICD-10-CM | POA: Diagnosis not present

## 2018-10-17 DIAGNOSIS — Z7901 Long term (current) use of anticoagulants: Secondary | ICD-10-CM | POA: Diagnosis not present

## 2018-10-19 DIAGNOSIS — G4733 Obstructive sleep apnea (adult) (pediatric): Secondary | ICD-10-CM | POA: Diagnosis not present

## 2018-10-19 DIAGNOSIS — I480 Paroxysmal atrial fibrillation: Secondary | ICD-10-CM | POA: Diagnosis not present

## 2018-10-19 DIAGNOSIS — Z09 Encounter for follow-up examination after completed treatment for conditions other than malignant neoplasm: Secondary | ICD-10-CM | POA: Diagnosis not present

## 2018-10-19 DIAGNOSIS — R5383 Other fatigue: Secondary | ICD-10-CM | POA: Diagnosis not present

## 2018-10-19 DIAGNOSIS — I10 Essential (primary) hypertension: Secondary | ICD-10-CM | POA: Diagnosis not present

## 2018-10-20 DIAGNOSIS — G4733 Obstructive sleep apnea (adult) (pediatric): Secondary | ICD-10-CM | POA: Diagnosis not present

## 2018-10-21 ENCOUNTER — Ambulatory Visit (INDEPENDENT_AMBULATORY_CARE_PROVIDER_SITE_OTHER): Payer: PPO | Admitting: Physical Medicine and Rehabilitation

## 2018-10-21 ENCOUNTER — Encounter (INDEPENDENT_AMBULATORY_CARE_PROVIDER_SITE_OTHER): Payer: Self-pay | Admitting: Physical Medicine and Rehabilitation

## 2018-10-21 ENCOUNTER — Ambulatory Visit (INDEPENDENT_AMBULATORY_CARE_PROVIDER_SITE_OTHER): Payer: Self-pay

## 2018-10-21 VITALS — BP 151/78 | HR 66

## 2018-10-21 DIAGNOSIS — M5116 Intervertebral disc disorders with radiculopathy, lumbar region: Secondary | ICD-10-CM | POA: Diagnosis not present

## 2018-10-21 DIAGNOSIS — M5416 Radiculopathy, lumbar region: Secondary | ICD-10-CM | POA: Diagnosis not present

## 2018-10-21 MED ORDER — BETAMETHASONE SOD PHOS & ACET 6 (3-3) MG/ML IJ SUSP
12.0000 mg | Freq: Once | INTRAMUSCULAR | Status: AC
  Administered 2018-10-21: 12 mg

## 2018-10-21 NOTE — Progress Notes (Signed)
[facility]  Numeric Pain Rating Scale and Functional Assessment Average Pain 8   In the last MONTH (on 0-10 scale) has pain interfered with the following?  1. General activity like being  able to carry out your everyday physical activities such as walking, climbing stairs, carrying groceries, or moving a chair?  Rating(6)   +Driver, +BT(warfarin, ok for inj), -Dye Allergies.

## 2018-10-21 NOTE — Patient Instructions (Signed)

## 2018-11-08 ENCOUNTER — Telehealth (INDEPENDENT_AMBULATORY_CARE_PROVIDER_SITE_OTHER): Payer: Self-pay | Admitting: Physical Medicine and Rehabilitation

## 2018-11-08 NOTE — Telephone Encounter (Signed)
Left L3 TF esi and ask about PT in [facility] for bursa

## 2018-11-09 NOTE — Telephone Encounter (Signed)
Scheduled for 11/23/18 with driver.

## 2018-11-14 DIAGNOSIS — I10 Essential (primary) hypertension: Secondary | ICD-10-CM | POA: Diagnosis not present

## 2018-11-14 DIAGNOSIS — E1165 Type 2 diabetes mellitus with hyperglycemia: Secondary | ICD-10-CM | POA: Diagnosis not present

## 2018-11-14 DIAGNOSIS — D500 Iron deficiency anemia secondary to blood loss (chronic): Secondary | ICD-10-CM | POA: Diagnosis not present

## 2018-11-14 DIAGNOSIS — E559 Vitamin D deficiency, unspecified: Secondary | ICD-10-CM | POA: Diagnosis not present

## 2018-11-14 DIAGNOSIS — E7800 Pure hypercholesterolemia, unspecified: Secondary | ICD-10-CM | POA: Diagnosis not present

## 2018-11-14 DIAGNOSIS — Z7901 Long term (current) use of anticoagulants: Secondary | ICD-10-CM | POA: Diagnosis not present

## 2018-11-15 DIAGNOSIS — I480 Paroxysmal atrial fibrillation: Secondary | ICD-10-CM | POA: Diagnosis not present

## 2018-11-15 DIAGNOSIS — G2581 Restless legs syndrome: Secondary | ICD-10-CM | POA: Diagnosis not present

## 2018-11-15 DIAGNOSIS — E1165 Type 2 diabetes mellitus with hyperglycemia: Secondary | ICD-10-CM | POA: Diagnosis not present

## 2018-11-15 DIAGNOSIS — I10 Essential (primary) hypertension: Secondary | ICD-10-CM | POA: Diagnosis not present

## 2018-11-15 DIAGNOSIS — G4709 Other insomnia: Secondary | ICD-10-CM | POA: Diagnosis not present

## 2018-11-15 DIAGNOSIS — Z1239 Encounter for other screening for malignant neoplasm of breast: Secondary | ICD-10-CM | POA: Diagnosis not present

## 2018-11-15 DIAGNOSIS — F3341 Major depressive disorder, recurrent, in partial remission: Secondary | ICD-10-CM | POA: Diagnosis not present

## 2018-11-15 DIAGNOSIS — M62838 Other muscle spasm: Secondary | ICD-10-CM | POA: Diagnosis not present

## 2018-11-15 DIAGNOSIS — Z6836 Body mass index (BMI) 36.0-36.9, adult: Secondary | ICD-10-CM | POA: Diagnosis not present

## 2018-11-15 DIAGNOSIS — Z0000 Encounter for general adult medical examination without abnormal findings: Secondary | ICD-10-CM | POA: Diagnosis not present

## 2018-11-15 DIAGNOSIS — K219 Gastro-esophageal reflux disease without esophagitis: Secondary | ICD-10-CM | POA: Diagnosis not present

## 2018-11-19 DIAGNOSIS — G4733 Obstructive sleep apnea (adult) (pediatric): Secondary | ICD-10-CM | POA: Diagnosis not present

## 2018-11-23 ENCOUNTER — Encounter (INDEPENDENT_AMBULATORY_CARE_PROVIDER_SITE_OTHER): Payer: PPO | Admitting: Physical Medicine and Rehabilitation

## 2018-11-27 DIAGNOSIS — I10 Essential (primary) hypertension: Secondary | ICD-10-CM | POA: Diagnosis not present

## 2018-11-27 DIAGNOSIS — Z7982 Long term (current) use of aspirin: Secondary | ICD-10-CM | POA: Diagnosis not present

## 2018-11-27 DIAGNOSIS — F329 Major depressive disorder, single episode, unspecified: Secondary | ICD-10-CM | POA: Insufficient documentation

## 2018-11-27 DIAGNOSIS — G2581 Restless legs syndrome: Secondary | ICD-10-CM | POA: Diagnosis not present

## 2018-11-27 DIAGNOSIS — E669 Obesity, unspecified: Secondary | ICD-10-CM | POA: Diagnosis not present

## 2018-11-27 DIAGNOSIS — F411 Generalized anxiety disorder: Secondary | ICD-10-CM | POA: Diagnosis not present

## 2018-11-27 DIAGNOSIS — J90 Pleural effusion, not elsewhere classified: Secondary | ICD-10-CM | POA: Diagnosis not present

## 2018-11-27 DIAGNOSIS — R918 Other nonspecific abnormal finding of lung field: Secondary | ICD-10-CM | POA: Diagnosis not present

## 2018-11-27 DIAGNOSIS — I482 Chronic atrial fibrillation, unspecified: Secondary | ICD-10-CM | POA: Diagnosis not present

## 2018-11-27 DIAGNOSIS — Z6835 Body mass index (BMI) 35.0-35.9, adult: Secondary | ICD-10-CM | POA: Diagnosis not present

## 2018-11-27 DIAGNOSIS — Z9114 Patient's other noncompliance with medication regimen: Secondary | ICD-10-CM | POA: Diagnosis not present

## 2018-11-27 DIAGNOSIS — I071 Rheumatic tricuspid insufficiency: Secondary | ICD-10-CM | POA: Diagnosis not present

## 2018-11-27 DIAGNOSIS — G4733 Obstructive sleep apnea (adult) (pediatric): Secondary | ICD-10-CM | POA: Diagnosis not present

## 2018-11-27 DIAGNOSIS — F321 Major depressive disorder, single episode, moderate: Secondary | ICD-10-CM | POA: Diagnosis not present

## 2018-11-27 DIAGNOSIS — R7989 Other specified abnormal findings of blood chemistry: Secondary | ICD-10-CM | POA: Diagnosis not present

## 2018-11-27 DIAGNOSIS — J9601 Acute respiratory failure with hypoxia: Secondary | ICD-10-CM | POA: Diagnosis not present

## 2018-11-27 DIAGNOSIS — I059 Rheumatic mitral valve disease, unspecified: Secondary | ICD-10-CM | POA: Diagnosis not present

## 2018-11-27 DIAGNOSIS — Z889 Allergy status to unspecified drugs, medicaments and biological substances status: Secondary | ICD-10-CM | POA: Diagnosis not present

## 2018-11-27 DIAGNOSIS — R609 Edema, unspecified: Secondary | ICD-10-CM | POA: Diagnosis not present

## 2018-11-27 DIAGNOSIS — J810 Acute pulmonary edema: Secondary | ICD-10-CM | POA: Diagnosis not present

## 2018-11-27 DIAGNOSIS — Z66 Do not resuscitate: Secondary | ICD-10-CM | POA: Diagnosis not present

## 2018-11-27 DIAGNOSIS — K219 Gastro-esophageal reflux disease without esophagitis: Secondary | ICD-10-CM | POA: Diagnosis not present

## 2018-11-27 DIAGNOSIS — I517 Cardiomegaly: Secondary | ICD-10-CM | POA: Diagnosis not present

## 2018-11-27 DIAGNOSIS — R0902 Hypoxemia: Secondary | ICD-10-CM | POA: Diagnosis not present

## 2018-11-27 DIAGNOSIS — E871 Hypo-osmolality and hyponatremia: Secondary | ICD-10-CM | POA: Diagnosis not present

## 2018-11-27 DIAGNOSIS — Z79899 Other long term (current) drug therapy: Secondary | ICD-10-CM | POA: Diagnosis not present

## 2018-11-27 DIAGNOSIS — F418 Other specified anxiety disorders: Secondary | ICD-10-CM | POA: Diagnosis not present

## 2018-11-27 DIAGNOSIS — E119 Type 2 diabetes mellitus without complications: Secondary | ICD-10-CM | POA: Diagnosis not present

## 2018-11-29 ENCOUNTER — Encounter (INDEPENDENT_AMBULATORY_CARE_PROVIDER_SITE_OTHER): Payer: PPO | Admitting: Physical Medicine and Rehabilitation

## 2018-11-29 ENCOUNTER — Encounter (INDEPENDENT_AMBULATORY_CARE_PROVIDER_SITE_OTHER): Payer: Self-pay | Admitting: Physical Medicine and Rehabilitation

## 2018-11-29 DIAGNOSIS — I480 Paroxysmal atrial fibrillation: Secondary | ICD-10-CM | POA: Diagnosis not present

## 2018-11-29 DIAGNOSIS — J810 Acute pulmonary edema: Secondary | ICD-10-CM | POA: Diagnosis not present

## 2018-11-29 DIAGNOSIS — J9601 Acute respiratory failure with hypoxia: Secondary | ICD-10-CM | POA: Diagnosis not present

## 2018-11-29 DIAGNOSIS — Z7901 Long term (current) use of anticoagulants: Secondary | ICD-10-CM | POA: Diagnosis not present

## 2018-11-30 ENCOUNTER — Other Ambulatory Visit: Payer: Self-pay | Admitting: Student-PharmD

## 2018-11-30 MED ORDER — HYDROCODONE-ACETAMINOPHEN 5-325 MG PO TABS
1.00 | ORAL_TABLET | ORAL | Status: DC
Start: ? — End: 2018-11-30

## 2018-11-30 MED ORDER — GENERIC EXTERNAL MEDICATION
4.00 | Status: DC
Start: ? — End: 2018-11-30

## 2018-11-30 MED ORDER — SODIUM CHLORIDE 0.9 % IV SOLN
10.00 | INTRAVENOUS | Status: DC
Start: ? — End: 2018-11-30

## 2018-11-30 MED ORDER — SERTRALINE HCL 50 MG PO TABS
50.00 | ORAL_TABLET | ORAL | Status: DC
Start: 2018-11-29 — End: 2018-11-30

## 2018-11-30 MED ORDER — GENERIC EXTERNAL MEDICATION
650.00 | Status: DC
Start: ? — End: 2018-11-30

## 2018-11-30 MED ORDER — SOTALOL HCL 80 MG PO TABS
80.00 | ORAL_TABLET | ORAL | Status: DC
Start: 2018-11-28 — End: 2018-11-30

## 2018-11-30 MED ORDER — HYDRALAZINE HCL 20 MG/ML IJ SOLN
10.00 | INTRAMUSCULAR | Status: DC
Start: ? — End: 2018-11-30

## 2018-11-30 MED ORDER — ASPIRIN 81 MG PO CHEW
81.00 | CHEWABLE_TABLET | ORAL | Status: DC
Start: 2018-11-29 — End: 2018-11-30

## 2018-11-30 MED ORDER — GENERIC EXTERNAL MEDICATION
1.50 | Status: DC
Start: 2018-11-28 — End: 2018-11-30

## 2018-11-30 MED ORDER — PANTOPRAZOLE SODIUM 40 MG PO TBEC
40.00 | DELAYED_RELEASE_TABLET | ORAL | Status: DC
Start: 2018-11-28 — End: 2018-11-30

## 2018-11-30 MED ORDER — GUAIFENESIN 100 MG/5ML PO LIQD
200.00 | ORAL | Status: DC
Start: ? — End: 2018-11-30

## 2018-11-30 MED ORDER — GENERIC EXTERNAL MEDICATION
10.00 | Status: DC
Start: ? — End: 2018-11-30

## 2018-11-30 MED ORDER — POLYETHYLENE GLYCOL 3350 17 G PO PACK
17.00 | PACK | ORAL | Status: DC
Start: ? — End: 2018-11-30

## 2018-11-30 MED ORDER — ACETAMINOPHEN 325 MG PO TABS
650.00 | ORAL_TABLET | ORAL | Status: DC
Start: ? — End: 2018-11-30

## 2018-11-30 MED ORDER — ALPRAZOLAM 0.5 MG PO TABS
.50 | ORAL_TABLET | ORAL | Status: DC
Start: ? — End: 2018-11-30

## 2018-11-30 MED ORDER — FUROSEMIDE 10 MG/ML IJ SOLN
20.00 | INTRAMUSCULAR | Status: DC
Start: 2018-11-28 — End: 2018-11-30

## 2018-11-30 MED ORDER — ENOXAPARIN SODIUM 40 MG/0.4ML ~~LOC~~ SOLN
40.00 | SUBCUTANEOUS | Status: DC
Start: 2018-11-28 — End: 2018-11-30

## 2018-11-30 MED ORDER — ALBUTEROL SULFATE (2.5 MG/3ML) 0.083% IN NEBU
2.50 | INHALATION_SOLUTION | RESPIRATORY_TRACT | Status: DC
Start: ? — End: 2018-11-30

## 2018-11-30 MED ORDER — FLUTICASONE PROPIONATE 50 MCG/ACT NA SUSP
2.00 | NASAL | Status: DC
Start: 2018-11-29 — End: 2018-11-30

## 2018-11-30 MED ORDER — ALUM & MAG HYDROXIDE-SIMETH 200-200-20 MG/5ML PO SUSP
15.00 | ORAL | Status: DC
Start: ? — End: 2018-11-30

## 2018-11-30 MED ORDER — GENERIC EXTERNAL MEDICATION
1.00 | Status: DC
Start: ? — End: 2018-11-30

## 2018-11-30 NOTE — Patient Outreach (Addendum)
[facility]) Care Management  [place]  11/30/2018  JULIANN OLESKY [birth year] [id number]  Reason for referral: Medication Reconciliation Post Discharge  Referral source: [employer] Current insurance:[employer]  PMHx includes but not limited to: T2DM, depression/anxiety, OSA on CPAP, HTN, atrial fibrillation s/p ablation (2016) and cardioversion (09/2018). Hospitalized from 1/26-1/27/20 for acute hypoxic respiratory failure due to diuretic noncompliance.  Outreach:  Successful telephone call with patient.  HIPAA identifiers verified.   Subjective:  Patient reports adherence to all medications except furosemide/potassium. She did not take furosemide yesterday, but states she did take it this morning. She only takes the potasium when she takes her furosemide.  Patient reports that she monitors her BG at home and has recently had some high fasting BG readings (~156). She states she had a recent epidural/steroid shot at the end of December. Prior to that, she reports fasting BGs being around 130.   She also reports that she is trying to get an earlier appointment with her pulmonologist (currently has appointment scheduled for 01/19/19). She states she may schedule an appointment with her cardiologist as "she wants to get to the bottom of this fluid problem". Patient report monitoring her weight at home. Her weight this morning was 213 lbs.  She states she can't remember other weights, as she has not be writing them down.  Objective: Lab Results  Component Value Date   CREATININE 0.67 02/15/2009   No results found for: HGBA1C  Lipid Panel  No results found for: CHOL, TRIG, HDL, CHOLHDL, VLDL, LDLCALC, LDLDIRECT  BP Readings from Last 3 Encounters:  10/21/18 (!) 151/78  09/22/18 (!) 163/74  08/12/18 139/65   Allergies  Allergen Reactions  . Valdecoxib Rash        Medications Reviewed Today    Reviewed by Chip Boer,  Student-PharmD (Student-PharmD) on 11/30/18 at [place] List Status: <None>  Medication Order Taking? Sig Documenting Provider Last Dose Status Informant  ALPRAZolam (XANAX) 0.5 MG tablet [id number] Yes Take 0.5 mg by mouth at bedtime as needed.   [provider] Taking Active   aspirin 81 MG tablet [id number] Yes Take 81 mg by mouth daily.   [provider] Taking Active   CARTIA XT 300 MG 24 hr capsule [id number] Yes Take 1 capsule by mouth daily. [provider] Taking Active   cholecalciferol (VITAMIN D3) 25 MCG (1000 UT) tablet [id number] Yes Take 2,000 Units by mouth daily. [provider] Taking Active   flecainide (TAMBOCOR) 50 MG tablet [id number] Yes Take 50 mg by mouth 2 (two) times daily. [provider] Taking Active   furosemide (LASIX) 20 MG tablet [id number] Yes Take 40 mg by mouth daily.  [provider] Taking Active            Med Note Judye Bos, Lonia Farber   Wed Nov 30, 2018  9:33 AM)    glipiZIDE (GLUCOTROL) 10 MG tablet [id number] Yes TAKE 1 TABLET BY MOUTH TWICE DAILY BEFORE MEALS [provider] Taking Active   lansoprazole (PREVACID) 30 MG capsule [id number]  Take 30 mg by mouth daily.   [provider]  Active   linaclotide Rolan Lipa) 72 MCG capsule [id number]  Take 1 capsule by mouth daily. [provider]  Expired 03/27/18 2359            Med Note Elayne Guerin   Wed Mar 09, 2018  1:32 PM) Not taking due to cost  metoprolol succinate (TOPROL-XL)  25 MG 24 hr tablet [id number] Yes Take by mouth daily.  [provider] Taking Active   mupirocin nasal ointment (BACTROBAN) 2 % [id number]  Place 1 application into the nose 2 (two) times daily. Use one-half of tube in each nostril twice daily for five (5) days. After application, press sides of nose together and gently massage. [provider]  Active Self  olmesartan-hydrochlorothiazide (BENICAR HCT) 40-25 MG per tablet [id number]  Take 1 tablet by  mouth daily.   [provider]  Active   ondansetron (ZOFRAN-ODT) 4 MG disintegrating tablet [id number] Yes Place 1 tablet under your tongue every 8 hours as needed [provider] Taking Active            Med Note Judye Bos, Mckenzye Cutright H   Wed Nov 30, 2018  9:31 AM) 8 mg  pantoprazole (PROTONIX) 40 MG tablet [id number]  Take by mouth. [provider]  Expired 09/04/18 2359   potassium chloride (K-DUR) 10 MEQ tablet [id number] Yes Take 1 tablet by mouth daily. [provider] Taking Active            Med Note Judye Bos, Cherlyn Cushing Nov 30, 2018  9:33 AM)    Pramipexole Dihydrochloride 1.5 MG TB24 [id number] Yes Take 1.5 mg by mouth. [provider] Taking Active   RABEprazole (ACIPHEX) 20 MG tablet [id number] Yes Take 20 mg by mouth daily. [provider] Taking Active   sertraline (ZOLOFT) 100 MG tablet [id number] Yes Take 50 mg by mouth daily.  [provider] Taking Active   sucralfate (CARAFATE) 1 g tablet [id number]  Take 1 g by mouth. [provider]  Active   warfarin (COUMADIN) 5 MG tablet [id number] Yes Take by mouth. [provider] Taking Active   zolpidem (AMBIEN) 10 MG tablet [id number] Yes TAKE 1 TABLET BY MOUTH AT BEDTIME AS NEEDED FOR INSOMNIA [provider] Taking Active          ASSESSMENT: Date Discharged from Hospital: 11/28/2018 Date Medication Reconciliation Performed: 11/30/2018  Medications:  New at Discharge: Marland Kitchen Glimepiride . Indapamide . Sotalol  Adjustments at Discharge: . None  Discontinued at Discharge:   Metoprolol  Glipizide  Furosemide  Patient was recently discharged from hospital and all medications have been reviewed. Per FNP Estill Bamberg Pickel's note from 11/29/18, the hospital did not reconcile the patient's medication list upon admission. They used an old medication list in their system and gave her sotalol, glimepiride, and indapamide inpatient, which were continued on  her medication list at discharge. PCP resumed patient's prior to admission medications at post discharge follow-up visit on 11/29/2018. Patient is now taking metoprolol, glipizide, and furosemide.  Assessment: Drugs sorted by system:  Neurologic/Psychologic: alprazolam, sertraline, zolpidem, pramipexole  Cardiovascular: aspirin, diltiazem, flecainide, furosemide, metoprolol succinate, warfarin  Gastrointestinal: rabeprazole, ondansetron  Endocrine: glipizide  Vitamins/Minerals/Supplements: vitamin D, potassium chloride  Medication Review Findings:  . Taking rabeprazole, not lansoprazole or pantoprazole . Taking vitamin D and iron supplements . Taking two 41m tablets of furosemide daily . Taking 811mof ondansetron PRN nausea . Not taking linaclotide (Linzess)  Medication Assistance Findings:  No medication assistance needs identified  Plan: -Educated patient on importance of monitoring weight at home and recording readings. Educated patient that if she has a 3 lb weight gain in 1 day or 5 lbs over a week to call her doctor. Patient voiced understanding and verbalized she will record daily weights. -Will route note to PCP, JoSherryll BurgerharmD  Candidate, Class of 2020 [place], [place] [phone]

## 2018-12-02 ENCOUNTER — Other Ambulatory Visit: Payer: Self-pay

## 2018-12-02 DIAGNOSIS — G4733 Obstructive sleep apnea (adult) (pediatric): Secondary | ICD-10-CM | POA: Diagnosis not present

## 2018-12-02 DIAGNOSIS — J90 Pleural effusion, not elsewhere classified: Secondary | ICD-10-CM | POA: Diagnosis not present

## 2018-12-02 DIAGNOSIS — R0902 Hypoxemia: Secondary | ICD-10-CM | POA: Diagnosis not present

## 2018-12-02 DIAGNOSIS — J810 Acute pulmonary edema: Secondary | ICD-10-CM | POA: Diagnosis not present

## 2018-12-02 NOTE — Patient Outreach (Signed)
[place] [facility]) Care Management  12/02/2018  Tina Baker 01-Jan-1946 [id number]     Transition of Care Referral  Referral Date: 12/02/2018 Referral Source: HTA Discharge Report Date of Admission: unknown Diagnosis: "hypoemia" Date of Discharge: 11/28/2018 Facility: [facility] Insurance: HTA    Outreach attempt # 1 to patient. Spoke with patient who voices she is doing well since return home. RN CM discussed purpose of call. Patient states she has already spoken with [facility] pharmacist and completed med review. PCP office has also already contacted patient for [facility] call.Patient confirmed that she has all her meds and denies any issues or concerns regarding them.  She has already completed PCP follow up appt. She goes to see pulmonologist later this afternoon and voices she has to schedule appt with cardiologist.She denies any issues with transportation.Patient voices that she is knowledgeable regarding her condition and knows when to seek medical attention. She denies any [facility] needs or concerns at this time.   Plan: RN CM will close case as no further interventions needed at this time.   Enzo Montgomery, RN,BSN,CCM [place] [facility] Telephonic Care Management Coordinator Direct Phone: [phone] Toll Free: [phone] Fax: [phone]

## 2018-12-09 DIAGNOSIS — J309 Allergic rhinitis, unspecified: Secondary | ICD-10-CM | POA: Diagnosis not present

## 2018-12-09 DIAGNOSIS — L905 Scar conditions and fibrosis of skin: Secondary | ICD-10-CM | POA: Diagnosis not present

## 2018-12-09 DIAGNOSIS — G4709 Other insomnia: Secondary | ICD-10-CM | POA: Diagnosis not present

## 2018-12-12 DIAGNOSIS — H2513 Age-related nuclear cataract, bilateral: Secondary | ICD-10-CM | POA: Diagnosis not present

## 2018-12-12 DIAGNOSIS — E119 Type 2 diabetes mellitus without complications: Secondary | ICD-10-CM | POA: Diagnosis not present

## 2018-12-12 DIAGNOSIS — H1851 Endothelial corneal dystrophy: Secondary | ICD-10-CM | POA: Diagnosis not present

## 2018-12-14 DIAGNOSIS — Z7901 Long term (current) use of anticoagulants: Secondary | ICD-10-CM | POA: Diagnosis not present

## 2018-12-26 ENCOUNTER — Ambulatory Visit (INDEPENDENT_AMBULATORY_CARE_PROVIDER_SITE_OTHER): Payer: PPO | Admitting: Physical Medicine and Rehabilitation

## 2018-12-26 ENCOUNTER — Encounter (INDEPENDENT_AMBULATORY_CARE_PROVIDER_SITE_OTHER): Payer: Self-pay | Admitting: Physical Medicine and Rehabilitation

## 2018-12-26 ENCOUNTER — Ambulatory Visit (INDEPENDENT_AMBULATORY_CARE_PROVIDER_SITE_OTHER): Payer: Self-pay

## 2018-12-26 VITALS — BP 145/83 | HR 52 | Temp 97.7°F

## 2018-12-26 DIAGNOSIS — M5416 Radiculopathy, lumbar region: Secondary | ICD-10-CM | POA: Diagnosis not present

## 2018-12-26 DIAGNOSIS — M5116 Intervertebral disc disorders with radiculopathy, lumbar region: Secondary | ICD-10-CM | POA: Diagnosis not present

## 2018-12-26 MED ORDER — BETAMETHASONE SOD PHOS & ACET 6 (3-3) MG/ML IJ SUSP
12.0000 mg | Freq: Once | INTRAMUSCULAR | Status: AC
  Administered 2018-12-26: 12 mg

## 2018-12-26 NOTE — Progress Notes (Signed)
Marland Kitchen  Numeric Pain Rating Scale and Functional Assessment Average Pain 10   In the last MONTH (on 0-10 scale) has pain interfered with the following?  1. General activity like being  able to carry out your everyday physical activities such as walking, climbing stairs, carrying groceries, or moving a chair?  Rating(6)   +Driver, +BT(warfarin, ok for inj), -Dye Allergies.

## 2018-12-27 ENCOUNTER — Encounter (INDEPENDENT_AMBULATORY_CARE_PROVIDER_SITE_OTHER): Payer: Self-pay | Admitting: Physical Medicine and Rehabilitation

## 2018-12-27 DIAGNOSIS — G4733 Obstructive sleep apnea (adult) (pediatric): Secondary | ICD-10-CM | POA: Diagnosis not present

## 2018-12-27 MED ORDER — TRIAMCINOLONE ACETONIDE 40 MG/ML IJ SUSP
80.0000 mg | INTRAMUSCULAR | Status: AC | PRN
  Administered 2018-09-22: 80 mg via INTRA_ARTICULAR

## 2018-12-27 MED ORDER — BUPIVACAINE HCL 0.25 % IJ SOLN
4.0000 mL | INTRAMUSCULAR | Status: AC | PRN
  Administered 2018-09-22: 4 mL via INTRA_ARTICULAR

## 2018-12-27 MED ORDER — LIDOCAINE HCL 2 % IJ SOLN
4.0000 mL | INTRAMUSCULAR | Status: AC | PRN
  Administered 2018-09-22: 4 mL

## 2018-12-27 NOTE — Procedures (Signed)
Lumbosacral Transforaminal Epidural Steroid Injection - Sub-Pedicular Approach with Fluoroscopic Guidance  Patient: Tina Baker      Date of Birth: 1946/02/21 MRN: [id number] PCP: Dyann Ruddle, MD      Visit Date: 12/26/2018   Universal Protocol:    Date/Time: 12/26/2018  Consent Given By: the patient  Position: PRONE  Additional Comments: Vital signs were monitored before and after the procedure. Patient was prepped and draped in the usual sterile fashion. The correct patient, procedure, and site was verified.   Injection Procedure Details:  Procedure Site One Meds Administered:  Meds ordered this encounter  Medications  . betamethasone acetate-betamethasone sodium phosphate (CELESTONE) injection 12 mg    Laterality: Left  Location/Site:  L3-L4  Needle size: 22 G  Needle type: Spinal  Needle Placement: Transforaminal  Findings:    -Comments: Excellent flow of contrast along the nerve and into the epidural space.  Procedure Details: After squaring off the end-plates to get a true AP view, the C-arm was positioned so that an oblique view of the foramen as noted above was visualized. The target area is just inferior to the "nose of the scotty dog" or sub pedicular. The soft tissues overlying this structure were infiltrated with 2-3 ml. of 1% Lidocaine without Epinephrine.  The spinal needle was inserted toward the target using a "trajectory" view along the fluoroscope beam.  Under AP and lateral visualization, the needle was advanced so it did not puncture dura and was located close the 6 O'Clock position of the pedical in AP tracterory. Biplanar projections were used to confirm position. Aspiration was confirmed to be negative for CSF and/or blood. A 1-2 ml. volume of Isovue-250 was injected and flow of contrast was noted at each level. Radiographs were obtained for documentation purposes.   After attaining the desired flow of contrast documented above, a 0.5 to  1.0 ml test dose of 0.25% Marcaine was injected into each respective transforaminal space.  The patient was observed for 90 seconds post injection.  After no sensory deficits were reported, and normal lower extremity motor function was noted,   the above injectate was administered so that equal amounts of the injectate were placed at each foramen (level) into the transforaminal epidural space.   Additional Comments:  The patient tolerated the procedure well Dressing: 2 x 2 sterile gauze and Band-Aid    Post-procedure details: Patient was observed during the procedure. Post-procedure instructions were reviewed.  Patient left the clinic in stable condition.

## 2018-12-27 NOTE — Progress Notes (Signed)
Tina Baker - 73 y.o. female MRN [id number]  Date of birth: 1946-08-27  Office Visit Note: Visit Date: 10/21/2018 PCP: Dyann Ruddle, MD Referred by: Dyann Ruddle, MD  Subjective: Chief Complaint  Patient presents with  . Lower Back - Pain  . Left Leg - Pain   HPI: Tina Baker is a 73 y.o. female who comes in today For MRI review of the lumbar spine as well as evaluation management of the left low back and hip and thigh pain.  Her history is well-documented with [place] and she is had history of chronic back pain for some time.  Over the last year we had initially gotten some results with epidural injection but lately this is just not seem to help much at all and we felt like maybe she was having an issue with bursitis and that seemed to help a little bit.  Diagnostically it was a challenge and that everything seems to hurt on exam and injection treatment seems to help temporarily no matter what we do.  Long discussion with her today about her pain level which is basically the left hip and anterior lateral thigh to the knee but occasionally down the leg.  We did obtain new MRI which is reviewed with the patient today with spine models and imaging.  There is a disc extrusion at L3-4 with foraminal narrowing at L3 Sharol Given the disc itself and likely L3 radiculopathy.  She has had no other new symptoms.  Case is complicated by morbid obesity as well as non-insulin-dependent diabetes and cardiovascular and pulmonary disease.  Review of Systems  Constitutional: Negative for chills, fever, malaise/fatigue and weight loss.  HENT: Negative for hearing loss and sinus pain.   Eyes: Negative for blurred vision, double vision and photophobia.  Respiratory: Negative for cough and shortness of breath.   Cardiovascular: Negative for chest pain, palpitations and leg swelling.  Gastrointestinal: Negative for abdominal pain, nausea and vomiting.  Genitourinary: Negative for flank pain.  Musculoskeletal:  Positive for back pain. Negative for myalgias.       Left hip and thigh pain  Skin: Negative for itching and rash.  Neurological: Negative for tremors, focal weakness and weakness.  Endo/Heme/Allergies: Negative.   Psychiatric/Behavioral: Negative for depression.  All other systems reviewed and are negative.  Otherwise per HPI.  Assessment & Plan: Visit Diagnoses:  1. Lumbar radiculopathy   2. Radiculopathy due to lumbar intervertebral disc disorder     Plan: Findings:  Chronic worsening severe left hip and thigh pain along with chronic low back pain and multiple joint pain.  In terms of her most recent level of pain this seems to be consistent with the newly found disc herniation extrusion at L3-4 with foraminal impact.  L3 symptoms are consistent with her clinical symptoms.  I think the best approach today to the level of pain she is having is a left L3-4 interlaminar epidural steroid injection.  She has done well with injection in the past but not recently.  We do monitor this for her blood sugar.  Will have her follow-up as needed and told her at this point to give this 2 weeks to see what happens in terms of pain relief.  I suspect she will get some relief.  Depending on relief would look at transforaminal approach.    Meds & Orders:  Meds ordered this encounter  Medications  . betamethasone acetate-betamethasone sodium phosphate (CELESTONE) injection 12 mg    Orders Placed This Encounter  Procedures  .  XR C-ARM NO REPORT    Follow-up: Return if symptoms worsen or fail to improve.   Procedures: Lumbar Epidural Steroid Injection - Interlaminar Approach with Fluoroscopic Guidance  Patient: Tina Baker      Date of Birth: February 18, 1946 MRN: [id number] PCP: Dyann Ruddle, MD      Visit Date: 10/21/2018   Universal Protocol:     Consent Given By: the patient  Position: PRONE  Additional Comments: Vital signs were monitored before and after the procedure. Patient was  prepped and draped in the usual sterile fashion. The correct patient, procedure, and site was verified.   Injection Procedure Details:  Procedure Site One Meds Administered:  Meds ordered this encounter  Medications  . betamethasone acetate-betamethasone sodium phosphate (CELESTONE) injection 12 mg     Laterality: Left  Location/Site:  L3-L4  Needle size: 20 G  Needle type: Tuohy  Needle Placement: Paramedian epidural  Findings:   -Comments: Excellent flow of contrast into the epidural space.  Procedure Details: Using a paramedian approach from the side mentioned above, the region overlying the inferior lamina was localized under fluoroscopic visualization and the soft tissues overlying this structure were infiltrated with 4 ml. of 1% Lidocaine without Epinephrine. The Tuohy needle was inserted into the epidural space using a paramedian approach.   The epidural space was localized using loss of resistance along with lateral and bi-planar fluoroscopic views.  After negative aspirate for air, blood, and CSF, a 2 ml. volume of Isovue-250 was injected into the epidural space and the flow of contrast was observed. Radiographs were obtained for documentation purposes.    The injectate was administered into the level noted above.   Additional Comments:  The patient tolerated the procedure well Dressing: 2 x 2 sterile gauze and Band-Aid    Post-procedure details: Patient was observed during the procedure. Post-procedure instructions were reviewed.  Patient left the clinic in stable condition. No notes on file   Clinical History: MRI LUMBAR SPINE WITHOUT CONTRAST  TECHNIQUE: Multiplanar, multisequence MR imaging of the lumbar spine was performed. No intravenous contrast was administered.  COMPARISON:  CT Abdomen and Pelvis 02/19/2018. [facility] lumbar MRI 03/12/2017.  FINDINGS: Segmentation:  Normal on the CT earlier this year.  Alignment: Stable since  2018. Chronic grade 1 anterolisthesis of L4 on L5 with straightening or mild reversal of upper lumbar lordosis.  Vertebrae: Chronic degenerative endplate changes. No marrow edema or evidence of acute osseous abnormality. Visualized bone marrow signal is within normal limits. Intact visible sacrum and SI joints.  Conus medullaris and cauda equina: Conus extends to the L1 level. No lower spinal cord or conus signal abnormality.  Paraspinal and other soft tissues: Chronic hepatomegaly. Stable visible abdominal viscera. Stable paraspinal soft tissues, chronic postoperative changes to the posterior paraspinal soft tissues L3-L4 through L5-S1.  Disc levels:  No lower thoracic spinal stenosis. There is chronic disc bulging and posterior element hypertrophy at T11-T12.  T12-L1:  Negative.  L1-L2:  Mild disc bulge.  No stenosis.  L2-L3: Chronic circumferential disc bulge with progressed broad-based posterior component since 2018. Mild facet hypertrophy. Increased mild spinal stenosis (series 6, image 13).  L3-L4: Chronic circumferential disc bulge and broad-based posterior disc protrusion with moderate facet and ligament flavum hypertrophy. Chronic postoperative changes to the lamina. Mild to moderate spinal stenosis and left greater than right lateral recess stenosis appears mildly increased since 2018. Furthermore, a left foraminal disc extrusion is new since 2018, best seen on series 3, image 10 and series  5, image 18. Associated severe left L3 foraminal stenosis. Possible 8-9 millimeter extruded disc fragment at the left foramen.  L4-L5: Chronic anterolisthesis with disc space loss, chronic circumferential disc bulge/pseudo disc, and superimposed chronic broad-based central disc protrusion (series 6, image 25). Severe chronic facet hypertrophy. Postoperative changes to the lamina. Mild to moderate spinal and moderate to severe bilateral lateral recess stenosis. Mild  left and moderate right L4 foraminal stenosis. This level is stable since 2018.  L5-S1: Chronic disc space loss and circumferential but mostly far lateral disc osteophyte complex. Moderate facet hypertrophy. Chronic postoperative changes to the lamina. No spinal stenosis. Mild left lateral recess stenosis and bilateral L5 foraminal stenosis. This level is stable.  IMPRESSION: 1. Symptomatic level appears to be L3-L4 where a left foraminal disc extrusion with possible 8-9 mm sequestered disc fragment is new since the 2018 MRI. New severe left foraminal stenosis. Query left L3 radiculitis. Underlying mild to moderate spinal and lateral recess stenosis at that level also appears mildly progressed. 2. Stable chronic degenerative and postoperative changes at L4-L5 and L5-S1. Chronic mild to moderate spinal and moderate to severe bilateral lateral recess stenosis at L4-L5. 3. Mild progression of chronic L2-L3 disc degeneration with increased mild spinal stenosis at that level.   Electronically Signed   By: Genevie Ann M.D.   On: 10/15/2018 19:23   She reports that she has never smoked. She has never used smokeless tobacco. No results for input(s): HGBA1C, LABURIC in the last 8760 hours.  Objective:  VS:  HT:    WT:   BMI:     BP:(!) 151/78  HR:66bpm  TEMP: ( )  RESP:  Physical Exam Vitals signs and nursing note reviewed.  Constitutional:      General: She is not in acute distress.    Appearance: Normal appearance. She is well-developed. She is obese. She is not ill-appearing.  HENT:     Head: Normocephalic and atraumatic.     Nose: Nose normal.     Mouth/Throat:     Mouth: Mucous membranes are moist.     Pharynx: Oropharynx is clear.  Eyes:     Conjunctiva/sclera: Conjunctivae normal.     Pupils: Pupils are equal, round, and reactive to light.  Neck:     Musculoskeletal: Normal range of motion and neck supple.  Cardiovascular:     Rate and Rhythm: Normal rate and  regular rhythm.     Pulses: Normal pulses.  Pulmonary:     Effort: Pulmonary effort is normal. No respiratory distress.  Abdominal:     General: There is no distension.     Palpations: Abdomen is soft.     Tenderness: There is no guarding.  Musculoskeletal:     Right lower leg: Edema present.     Left lower leg: Edema present.     Comments: Patient ambulates without aid is slow to rise from seated position has no pain with hip rotation does have pain of the left greater trochanter she has a negative slump test bilaterally and good distal strength without clonus.  Skin:    General: Skin is warm and dry.     Findings: No erythema or rash.  Neurological:     General: No focal deficit present.     Mental Status: She is alert and oriented to person, place, and time.     Sensory: No sensory deficit.     Motor: No abnormal muscle tone.     Coordination: Coordination normal.  Gait: Gait normal.  Psychiatric:        Mood and Affect: Mood normal.        Behavior: Behavior normal.        Thought Content: Thought content normal.     Ortho Exam Imaging: Xr C-arm No Report  Result Date: 12/26/2018 Please see Notes tab for imaging impression.   Past Medical/Family/Surgical/Social History: Medications & Allergies reviewed per EMR, new medications updated. Patient Active Problem List   Diagnosis Date Noted  . Constipation 02/19/2018  . Allergic rhinitis 11/13/2015  . Benign essential hypertension 11/13/2015  . Chronic tension-type headache, not intractable 11/13/2015  . DDD (degenerative disc disease), lumbosacral 11/13/2015  . Gastroesophageal reflux disease without esophagitis 11/13/2015  . Paroxysmal atrial fibrillation ([place]) 11/13/2015  . RLS (restless legs syndrome) 11/13/2015  . Vitamin D deficiency 11/13/2015  . Type 2 diabetes mellitus, without long-term current use of insulin ([place]) 06/25/2015  . Venous insufficiency, peripheral 01/09/2015   Past Medical History:    Diagnosis Date  . Arthritis   . Atrial fibrillation ([place])   . Diabetes mellitus   . Hyperlipidemia   . Hypertension    Family History  Problem Relation Age of Onset  . Cancer Mother 86       leukemia   . Heart disease Father 68       heart attack   . Heart disease Sister    Past Surgical History:  Procedure Laterality Date  . ABDOMINAL HYSTERECTOMY  2001  . BUNIONECTOMY Bilateral 1979  . GALLBLADDER SURGERY    . SPINE SURGERY  2010   protruding disc    Social History   Occupational History  . Not on file  Tobacco Use  . Smoking status: Never Smoker  . Smokeless tobacco: Never Used  Substance and Sexual Activity  . Alcohol use: No  . Drug use: No  . Sexual activity: Not on file

## 2018-12-27 NOTE — Progress Notes (Signed)
Tina Baker - 73 y.o. female MRN [id number]  Date of birth: 13-May-1946  Office Visit Note: Visit Date: 12/26/2018 PCP: Dyann Ruddle, MD Referred by: Dyann Ruddle, MD  Subjective: No chief complaint on file.  HPI: Tina Baker is a 73 y.o. female who comes in today For planned probable left L3 transforaminal epidural steroid injection.  Prior interlaminar injection did improve her quite a bit of relief for a while the symptoms returned.  She reports pain is 10 out of 10 pain but really clinically does not appear that that is the case at least seeing her in evaluating her in the office.  No doubt she does get pain with different movements and other times of the day.  Her case is complicated by medical issues including obesity and obstructive sleep apnea as well as type 2 diabetes which is non-insulin-dependent.  Furthermore I think there is an issue with anxiety and depression that is coupling with her current pathology of the lumbar spine and increasing her pain state overall.  She is still describing left lower back pain radiating into the thigh and a pretty classic L3 distribution.  She has had prior back surgery remotely by Dr. Joya Salm.  This was a laminectomy discectomy at a lower level.  She now has new disc herniation foraminally at L3-4 that does fit with his symptoms.  I think the best approach is left L3 transforaminal injection as talked about in her last note.  She does take Coumadin and we would be able to do this injection on anticoagulation.  Since I have seen her last she is also had a appointment with the emergency department for pulmonary edema and had pulmonary effusion.  This is still being looked at.  Today her oxygen sats in 80 symptoms were fine blood pressure was slightly high.  No other red flag complaints.  If she just does not get enough relief with this would probably look at potential for surgical consultation for discectomy.  ROS Otherwise per HPI.  Assessment  & Plan: Visit Diagnoses:  1. Lumbar radiculopathy   2. Radiculopathy due to lumbar intervertebral disc disorder     Plan: No additional findings.   Meds & Orders:  Meds ordered this encounter  Medications  . betamethasone acetate-betamethasone sodium phosphate (CELESTONE) injection 12 mg    Orders Placed This Encounter  Procedures  . XR C-ARM NO REPORT  . Epidural Steroid injection    Follow-up: Return if symptoms worsen or fail to improve.   Procedures: No procedures performed  Lumbosacral Transforaminal Epidural Steroid Injection - Sub-Pedicular Approach with Fluoroscopic Guidance  Patient: Tina Baker      Date of Birth: 07/15/1946 MRN: [id number] PCP: Dyann Ruddle, MD      Visit Date: 12/26/2018   Universal Protocol:    Date/Time: 12/26/2018  Consent Given By: the patient  Position: PRONE  Additional Comments: Vital signs were monitored before and after the procedure. Patient was prepped and draped in the usual sterile fashion. The correct patient, procedure, and site was verified.   Injection Procedure Details:  Procedure Site One Meds Administered:  Meds ordered this encounter  Medications  . betamethasone acetate-betamethasone sodium phosphate (CELESTONE) injection 12 mg    Laterality: Left  Location/Site:  L3-L4  Needle size: 22 G  Needle type: Spinal  Needle Placement: Transforaminal  Findings:    -Comments: Excellent flow of contrast along the nerve and into the epidural space.  Procedure Details: After squaring off the end-plates to  get a true AP view, the C-arm was positioned so that an oblique view of the foramen as noted above was visualized. The target area is just inferior to the "nose of the scotty dog" or sub pedicular. The soft tissues overlying this structure were infiltrated with 2-3 ml. of 1% Lidocaine without Epinephrine.  The spinal needle was inserted toward the target using a "trajectory" view along the fluoroscope  beam.  Under AP and lateral visualization, the needle was advanced so it did not puncture dura and was located close the 6 O'Clock position of the pedical in AP tracterory. Biplanar projections were used to confirm position. Aspiration was confirmed to be negative for CSF and/or blood. A 1-2 ml. volume of Isovue-250 was injected and flow of contrast was noted at each level. Radiographs were obtained for documentation purposes.   After attaining the desired flow of contrast documented above, a 0.5 to 1.0 ml test dose of 0.25% Marcaine was injected into each respective transforaminal space.  The patient was observed for 90 seconds post injection.  After no sensory deficits were reported, and normal lower extremity motor function was noted,   the above injectate was administered so that equal amounts of the injectate were placed at each foramen (level) into the transforaminal epidural space.   Additional Comments:  The patient tolerated the procedure well Dressing: 2 x 2 sterile gauze and Band-Aid    Post-procedure details: Patient was observed during the procedure. Post-procedure instructions were reviewed.  Patient left the clinic in stable condition.    Clinical History: MRI LUMBAR SPINE WITHOUT CONTRAST  TECHNIQUE: Multiplanar, multisequence MR imaging of the lumbar spine was performed. No intravenous contrast was administered.  COMPARISON:  CT Abdomen and Pelvis 02/19/2018. [facility] lumbar MRI 03/12/2017.  FINDINGS: Segmentation:  Normal on the CT earlier this year.  Alignment: Stable since 2018. Chronic grade 1 anterolisthesis of L4 on L5 with straightening or mild reversal of upper lumbar lordosis.  Vertebrae: Chronic degenerative endplate changes. No marrow edema or evidence of acute osseous abnormality. Visualized bone marrow signal is within normal limits. Intact visible sacrum and SI joints.  Conus medullaris and cauda equina: Conus extends to the L1  level. No lower spinal cord or conus signal abnormality.  Paraspinal and other soft tissues: Chronic hepatomegaly. Stable visible abdominal viscera. Stable paraspinal soft tissues, chronic postoperative changes to the posterior paraspinal soft tissues L3-L4 through L5-S1.  Disc levels:  No lower thoracic spinal stenosis. There is chronic disc bulging and posterior element hypertrophy at T11-T12.  T12-L1:  Negative.  L1-L2:  Mild disc bulge.  No stenosis.  L2-L3: Chronic circumferential disc bulge with progressed broad-based posterior component since 2018. Mild facet hypertrophy. Increased mild spinal stenosis (series 6, image 13).  L3-L4: Chronic circumferential disc bulge and broad-based posterior disc protrusion with moderate facet and ligament flavum hypertrophy. Chronic postoperative changes to the lamina. Mild to moderate spinal stenosis and left greater than right lateral recess stenosis appears mildly increased since 2018. Furthermore, a left foraminal disc extrusion is new since 2018, best seen on series 3, image 10 and series 5, image 18. Associated severe left L3 foraminal stenosis. Possible 8-9 millimeter extruded disc fragment at the left foramen.  L4-L5: Chronic anterolisthesis with disc space loss, chronic circumferential disc bulge/pseudo disc, and superimposed chronic broad-based central disc protrusion (series 6, image 25). Severe chronic facet hypertrophy. Postoperative changes to the lamina. Mild to moderate spinal and moderate to severe bilateral lateral recess stenosis. Mild left and moderate right  L4 foraminal stenosis. This level is stable since 2018.  L5-S1: Chronic disc space loss and circumferential but mostly far lateral disc osteophyte complex. Moderate facet hypertrophy. Chronic postoperative changes to the lamina. No spinal stenosis. Mild left lateral recess stenosis and bilateral L5 foraminal stenosis. This level is  stable.  IMPRESSION: 1. Symptomatic level appears to be L3-L4 where a left foraminal disc extrusion with possible 8-9 mm sequestered disc fragment is new since the 2018 MRI. New severe left foraminal stenosis. Query left L3 radiculitis. Underlying mild to moderate spinal and lateral recess stenosis at that level also appears mildly progressed. 2. Stable chronic degenerative and postoperative changes at L4-L5 and L5-S1. Chronic mild to moderate spinal and moderate to severe bilateral lateral recess stenosis at L4-L5. 3. Mild progression of chronic L2-L3 disc degeneration with increased mild spinal stenosis at that level.   Electronically Signed   By: Genevie Ann M.D.   On: 10/15/2018 19:23   She reports that she has never smoked. She has never used smokeless tobacco. No results for input(s): HGBA1C, LABURIC in the last 8760 hours.  Objective:  VS:  HT:    WT:   BMI:     BP:(!) 145/83  HR:(!) 52bpm  TEMP:97.7 F (36.5 C)(Oral)  RESP:  Physical Exam  Ortho Exam Imaging: Xr C-arm No Report  Result Date: 12/26/2018 Please see Notes tab for imaging impression.   Past Medical/Family/Surgical/Social History: Medications & Allergies reviewed per EMR, new medications updated. Patient Active Problem List   Diagnosis Date Noted  . Constipation 02/19/2018  . Allergic rhinitis 11/13/2015  . Benign essential hypertension 11/13/2015  . Chronic tension-type headache, not intractable 11/13/2015  . DDD (degenerative disc disease), lumbosacral 11/13/2015  . Gastroesophageal reflux disease without esophagitis 11/13/2015  . Paroxysmal atrial fibrillation ([employer]) 11/13/2015  . RLS (restless legs syndrome) 11/13/2015  . Vitamin D deficiency 11/13/2015  . Type 2 diabetes mellitus, without long-term current use of insulin ([place]) 06/25/2015  . Venous insufficiency, peripheral 01/09/2015   Past Medical History:  Diagnosis Date  . Arthritis   . Atrial fibrillation ([place])   . Diabetes  mellitus   . Hyperlipidemia   . Hypertension    Family History  Problem Relation Age of Onset  . Cancer Mother 1       leukemia   . Heart disease Father 36       heart attack   . Heart disease Sister    Past Surgical History:  Procedure Laterality Date  . ABDOMINAL HYSTERECTOMY  2001  . BUNIONECTOMY Bilateral 1979  . GALLBLADDER SURGERY    . SPINE SURGERY  2010   protruding disc    Social History   Occupational History  . Not on file  Tobacco Use  . Smoking status: Never Smoker  . Smokeless tobacco: Never Used  Substance and Sexual Activity  . Alcohol use: No  . Drug use: No  . Sexual activity: Not on file

## 2018-12-29 DIAGNOSIS — Z1231 Encounter for screening mammogram for malignant neoplasm of breast: Secondary | ICD-10-CM | POA: Diagnosis not present

## 2018-12-29 DIAGNOSIS — Z139 Encounter for screening, unspecified: Secondary | ICD-10-CM | POA: Diagnosis not present

## 2019-01-17 DIAGNOSIS — Z7901 Long term (current) use of anticoagulants: Secondary | ICD-10-CM | POA: Diagnosis not present

## 2019-02-01 DIAGNOSIS — I10 Essential (primary) hypertension: Secondary | ICD-10-CM | POA: Diagnosis not present

## 2019-02-01 DIAGNOSIS — Z7901 Long term (current) use of anticoagulants: Secondary | ICD-10-CM | POA: Diagnosis not present

## 2019-02-01 DIAGNOSIS — I480 Paroxysmal atrial fibrillation: Secondary | ICD-10-CM | POA: Diagnosis not present

## 2019-02-14 DIAGNOSIS — Z7901 Long term (current) use of anticoagulants: Secondary | ICD-10-CM | POA: Diagnosis not present

## 2019-02-17 DIAGNOSIS — Z7951 Long term (current) use of inhaled steroids: Secondary | ICD-10-CM | POA: Diagnosis not present

## 2019-02-17 DIAGNOSIS — I482 Chronic atrial fibrillation, unspecified: Secondary | ICD-10-CM | POA: Diagnosis not present

## 2019-02-17 DIAGNOSIS — F329 Major depressive disorder, single episode, unspecified: Secondary | ICD-10-CM | POA: Diagnosis not present

## 2019-02-17 DIAGNOSIS — G4733 Obstructive sleep apnea (adult) (pediatric): Secondary | ICD-10-CM | POA: Diagnosis not present

## 2019-02-17 DIAGNOSIS — Z79899 Other long term (current) drug therapy: Secondary | ICD-10-CM | POA: Diagnosis not present

## 2019-02-17 DIAGNOSIS — Z7901 Long term (current) use of anticoagulants: Secondary | ICD-10-CM | POA: Diagnosis not present

## 2019-02-17 DIAGNOSIS — R109 Unspecified abdominal pain: Secondary | ICD-10-CM | POA: Diagnosis not present

## 2019-02-17 DIAGNOSIS — K921 Melena: Secondary | ICD-10-CM | POA: Diagnosis not present

## 2019-02-17 DIAGNOSIS — K219 Gastro-esophageal reflux disease without esophagitis: Secondary | ICD-10-CM | POA: Diagnosis not present

## 2019-02-17 DIAGNOSIS — R1030 Lower abdominal pain, unspecified: Secondary | ICD-10-CM | POA: Diagnosis not present

## 2019-02-17 DIAGNOSIS — K922 Gastrointestinal hemorrhage, unspecified: Secondary | ICD-10-CM | POA: Insufficient documentation

## 2019-02-17 DIAGNOSIS — E119 Type 2 diabetes mellitus without complications: Secondary | ICD-10-CM | POA: Diagnosis not present

## 2019-02-17 DIAGNOSIS — Z7984 Long term (current) use of oral hypoglycemic drugs: Secondary | ICD-10-CM | POA: Diagnosis not present

## 2019-02-17 DIAGNOSIS — Z7982 Long term (current) use of aspirin: Secondary | ICD-10-CM | POA: Diagnosis not present

## 2019-02-17 DIAGNOSIS — I10 Essential (primary) hypertension: Secondary | ICD-10-CM | POA: Diagnosis not present

## 2019-02-17 DIAGNOSIS — Z888 Allergy status to other drugs, medicaments and biological substances status: Secondary | ICD-10-CM | POA: Diagnosis not present

## 2019-02-17 DIAGNOSIS — K625 Hemorrhage of anus and rectum: Secondary | ICD-10-CM | POA: Diagnosis not present

## 2019-02-18 DIAGNOSIS — E119 Type 2 diabetes mellitus without complications: Secondary | ICD-10-CM | POA: Diagnosis not present

## 2019-02-18 DIAGNOSIS — E669 Obesity, unspecified: Secondary | ICD-10-CM | POA: Diagnosis not present

## 2019-02-18 DIAGNOSIS — Z7901 Long term (current) use of anticoagulants: Secondary | ICD-10-CM | POA: Diagnosis not present

## 2019-02-18 DIAGNOSIS — I10 Essential (primary) hypertension: Secondary | ICD-10-CM | POA: Diagnosis not present

## 2019-02-18 DIAGNOSIS — K922 Gastrointestinal hemorrhage, unspecified: Secondary | ICD-10-CM | POA: Diagnosis not present

## 2019-02-18 DIAGNOSIS — G4733 Obstructive sleep apnea (adult) (pediatric): Secondary | ICD-10-CM | POA: Diagnosis not present

## 2019-02-18 DIAGNOSIS — Z9989 Dependence on other enabling machines and devices: Secondary | ICD-10-CM | POA: Diagnosis not present

## 2019-02-18 DIAGNOSIS — I482 Chronic atrial fibrillation, unspecified: Secondary | ICD-10-CM | POA: Diagnosis not present

## 2019-02-20 ENCOUNTER — Other Ambulatory Visit: Payer: Self-pay

## 2019-02-20 DIAGNOSIS — E119 Type 2 diabetes mellitus without complications: Secondary | ICD-10-CM | POA: Diagnosis not present

## 2019-02-20 DIAGNOSIS — F418 Other specified anxiety disorders: Secondary | ICD-10-CM | POA: Diagnosis not present

## 2019-02-20 DIAGNOSIS — I10 Essential (primary) hypertension: Secondary | ICD-10-CM | POA: Diagnosis not present

## 2019-02-20 DIAGNOSIS — Z79899 Other long term (current) drug therapy: Secondary | ICD-10-CM | POA: Diagnosis not present

## 2019-02-20 DIAGNOSIS — M62838 Other muscle spasm: Secondary | ICD-10-CM | POA: Diagnosis not present

## 2019-02-20 DIAGNOSIS — Z09 Encounter for follow-up examination after completed treatment for conditions other than malignant neoplasm: Secondary | ICD-10-CM | POA: Diagnosis not present

## 2019-02-20 DIAGNOSIS — K5900 Constipation, unspecified: Secondary | ICD-10-CM | POA: Diagnosis not present

## 2019-02-20 DIAGNOSIS — K625 Hemorrhage of anus and rectum: Secondary | ICD-10-CM | POA: Diagnosis not present

## 2019-02-20 DIAGNOSIS — M2550 Pain in unspecified joint: Secondary | ICD-10-CM | POA: Diagnosis not present

## 2019-02-20 DIAGNOSIS — Z7901 Long term (current) use of anticoagulants: Secondary | ICD-10-CM | POA: Diagnosis not present

## 2019-02-20 DIAGNOSIS — D649 Anemia, unspecified: Secondary | ICD-10-CM | POA: Diagnosis not present

## 2019-02-20 DIAGNOSIS — I480 Paroxysmal atrial fibrillation: Secondary | ICD-10-CM | POA: Diagnosis not present

## 2019-02-20 NOTE — Patient Outreach (Signed)
[facility]) Care Management  02/20/2019  EYVONNE BURCHFIELD 23-Feb-1946 [id number]   Telephone Screen  Referral Date: 02/20/2019 Referral Source: Nurse Call Center Referral Reason: "02/17/2019-8:39 pm-caller went to the restroom and sees bright red blood in urine, no temperature, takes Warfarin, INR was 3.5 earlier this week, having right flank pain Nurse Advice: "Go to ED now" Insurance: HTA   Outreach attempt # 1 to patient. Spoke with patient who voices she is feeling better. She reports that she has had no further bleeding episodes. Patient reports that blood was coming from rectum and not bladder that she originally thought was the source. She did take nurse at call center advice and seek medical attention. She went to the ED and had several labs and tests done. She voices she was told she had diverticulosis and hemorrhoids. She reports that she is waiting to hear back from DM office today as she is to have her INR rechecked. She reports that she is having no further pain or bleeding and feels fine. She is aware to seek medical attention if symptoms return. She denies any further RN CM needs or concerns a this time. Patient advised to feel free to call 24 hr Nurse Line for any future needs or concerns. They voiced understanding and was appreciative of follow up call.     Plan: RN CM will close case as no further interventions needed at this time.    Enzo Montgomery, RN,BSN,CCM [facility] Telephonic Care Management Coordinator Direct Phone: [phone] Toll Free: [phone] Fax: [phone]

## 2019-02-23 DIAGNOSIS — R768 Other specified abnormal immunological findings in serum: Secondary | ICD-10-CM | POA: Insufficient documentation

## 2019-02-28 DIAGNOSIS — Z5181 Encounter for therapeutic drug level monitoring: Secondary | ICD-10-CM | POA: Diagnosis not present

## 2019-02-28 DIAGNOSIS — Z7901 Long term (current) use of anticoagulants: Secondary | ICD-10-CM | POA: Diagnosis not present

## 2019-03-14 DIAGNOSIS — Z5181 Encounter for therapeutic drug level monitoring: Secondary | ICD-10-CM | POA: Diagnosis not present

## 2019-03-14 DIAGNOSIS — Z7901 Long term (current) use of anticoagulants: Secondary | ICD-10-CM | POA: Diagnosis not present

## 2019-03-24 DIAGNOSIS — R768 Other specified abnormal immunological findings in serum: Secondary | ICD-10-CM | POA: Diagnosis not present

## 2019-03-24 DIAGNOSIS — M25562 Pain in left knee: Secondary | ICD-10-CM | POA: Diagnosis not present

## 2019-03-24 DIAGNOSIS — M25561 Pain in right knee: Secondary | ICD-10-CM | POA: Diagnosis not present

## 2019-03-26 DIAGNOSIS — M25561 Pain in right knee: Secondary | ICD-10-CM | POA: Diagnosis not present

## 2019-03-26 DIAGNOSIS — M1711 Unilateral primary osteoarthritis, right knee: Secondary | ICD-10-CM | POA: Diagnosis not present

## 2019-03-26 DIAGNOSIS — R768 Other specified abnormal immunological findings in serum: Secondary | ICD-10-CM | POA: Diagnosis not present

## 2019-03-26 DIAGNOSIS — M25562 Pain in left knee: Secondary | ICD-10-CM | POA: Diagnosis not present

## 2019-03-26 DIAGNOSIS — M1712 Unilateral primary osteoarthritis, left knee: Secondary | ICD-10-CM | POA: Diagnosis not present

## 2019-04-06 DIAGNOSIS — I83812 Varicose veins of left lower extremities with pain: Secondary | ICD-10-CM | POA: Diagnosis not present

## 2019-04-06 DIAGNOSIS — R0602 Shortness of breath: Secondary | ICD-10-CM | POA: Diagnosis not present

## 2019-04-06 DIAGNOSIS — Z7901 Long term (current) use of anticoagulants: Secondary | ICD-10-CM | POA: Diagnosis not present

## 2019-04-06 DIAGNOSIS — M2550 Pain in unspecified joint: Secondary | ICD-10-CM | POA: Diagnosis not present

## 2019-04-06 DIAGNOSIS — Z7982 Long term (current) use of aspirin: Secondary | ICD-10-CM | POA: Diagnosis not present

## 2019-04-06 DIAGNOSIS — Z79899 Other long term (current) drug therapy: Secondary | ICD-10-CM | POA: Diagnosis not present

## 2019-04-06 DIAGNOSIS — I10 Essential (primary) hypertension: Secondary | ICD-10-CM | POA: Diagnosis not present

## 2019-04-06 DIAGNOSIS — I480 Paroxysmal atrial fibrillation: Secondary | ICD-10-CM | POA: Diagnosis not present

## 2019-04-06 DIAGNOSIS — Z7984 Long term (current) use of oral hypoglycemic drugs: Secondary | ICD-10-CM | POA: Diagnosis not present

## 2019-04-06 DIAGNOSIS — E1165 Type 2 diabetes mellitus with hyperglycemia: Secondary | ICD-10-CM | POA: Diagnosis not present

## 2019-04-06 DIAGNOSIS — I872 Venous insufficiency (chronic) (peripheral): Secondary | ICD-10-CM | POA: Diagnosis not present

## 2019-04-06 DIAGNOSIS — R600 Localized edema: Secondary | ICD-10-CM | POA: Diagnosis not present

## 2019-04-09 DIAGNOSIS — M25561 Pain in right knee: Secondary | ICD-10-CM | POA: Diagnosis not present

## 2019-04-09 DIAGNOSIS — R9389 Abnormal findings on diagnostic imaging of other specified body structures: Secondary | ICD-10-CM | POA: Diagnosis not present

## 2019-04-10 DIAGNOSIS — R768 Other specified abnormal immunological findings in serum: Secondary | ICD-10-CM | POA: Diagnosis not present

## 2019-04-10 DIAGNOSIS — I480 Paroxysmal atrial fibrillation: Secondary | ICD-10-CM | POA: Diagnosis not present

## 2019-04-10 DIAGNOSIS — Z79899 Other long term (current) drug therapy: Secondary | ICD-10-CM | POA: Diagnosis not present

## 2019-04-14 DIAGNOSIS — R001 Bradycardia, unspecified: Secondary | ICD-10-CM | POA: Diagnosis not present

## 2019-04-14 DIAGNOSIS — I4891 Unspecified atrial fibrillation: Secondary | ICD-10-CM | POA: Diagnosis not present

## 2019-04-14 DIAGNOSIS — I480 Paroxysmal atrial fibrillation: Secondary | ICD-10-CM | POA: Diagnosis not present

## 2019-04-14 DIAGNOSIS — I484 Atypical atrial flutter: Secondary | ICD-10-CM | POA: Diagnosis not present

## 2019-04-25 DIAGNOSIS — M1711 Unilateral primary osteoarthritis, right knee: Secondary | ICD-10-CM | POA: Diagnosis not present

## 2019-04-25 DIAGNOSIS — G5603 Carpal tunnel syndrome, bilateral upper limbs: Secondary | ICD-10-CM | POA: Diagnosis not present

## 2019-04-25 DIAGNOSIS — M65312 Trigger thumb, left thumb: Secondary | ICD-10-CM | POA: Diagnosis not present

## 2019-04-25 DIAGNOSIS — M1712 Unilateral primary osteoarthritis, left knee: Secondary | ICD-10-CM | POA: Diagnosis not present

## 2019-04-26 DIAGNOSIS — D1801 Hemangioma of skin and subcutaneous tissue: Secondary | ICD-10-CM | POA: Diagnosis not present

## 2019-04-26 DIAGNOSIS — L814 Other melanin hyperpigmentation: Secondary | ICD-10-CM | POA: Diagnosis not present

## 2019-04-26 DIAGNOSIS — Z7901 Long term (current) use of anticoagulants: Secondary | ICD-10-CM | POA: Diagnosis not present

## 2019-04-26 DIAGNOSIS — L820 Inflamed seborrheic keratosis: Secondary | ICD-10-CM | POA: Diagnosis not present

## 2019-05-01 DIAGNOSIS — G5603 Carpal tunnel syndrome, bilateral upper limbs: Secondary | ICD-10-CM | POA: Insufficient documentation

## 2019-05-01 DIAGNOSIS — M65312 Trigger thumb, left thumb: Secondary | ICD-10-CM | POA: Insufficient documentation

## 2019-05-01 DIAGNOSIS — M1712 Unilateral primary osteoarthritis, left knee: Secondary | ICD-10-CM | POA: Insufficient documentation

## 2019-05-01 DIAGNOSIS — M1711 Unilateral primary osteoarthritis, right knee: Secondary | ICD-10-CM | POA: Insufficient documentation

## 2019-05-03 DIAGNOSIS — R768 Other specified abnormal immunological findings in serum: Secondary | ICD-10-CM | POA: Diagnosis not present

## 2019-05-03 DIAGNOSIS — Z789 Other specified health status: Secondary | ICD-10-CM | POA: Diagnosis not present

## 2019-05-03 DIAGNOSIS — I480 Paroxysmal atrial fibrillation: Secondary | ICD-10-CM | POA: Diagnosis not present

## 2019-05-03 DIAGNOSIS — G4733 Obstructive sleep apnea (adult) (pediatric): Secondary | ICD-10-CM | POA: Diagnosis not present

## 2019-05-03 DIAGNOSIS — R0602 Shortness of breath: Secondary | ICD-10-CM | POA: Diagnosis not present

## 2019-05-03 DIAGNOSIS — Z9989 Dependence on other enabling machines and devices: Secondary | ICD-10-CM | POA: Diagnosis not present

## 2019-05-12 DIAGNOSIS — R918 Other nonspecific abnormal finding of lung field: Secondary | ICD-10-CM | POA: Diagnosis not present

## 2019-05-12 DIAGNOSIS — R0602 Shortness of breath: Secondary | ICD-10-CM | POA: Diagnosis not present

## 2019-05-12 DIAGNOSIS — R768 Other specified abnormal immunological findings in serum: Secondary | ICD-10-CM | POA: Diagnosis not present

## 2019-05-16 DIAGNOSIS — R399 Unspecified symptoms and signs involving the genitourinary system: Secondary | ICD-10-CM | POA: Diagnosis not present

## 2019-05-16 DIAGNOSIS — J029 Acute pharyngitis, unspecified: Secondary | ICD-10-CM | POA: Diagnosis not present

## 2019-05-16 DIAGNOSIS — H6982 Other specified disorders of Eustachian tube, left ear: Secondary | ICD-10-CM | POA: Diagnosis not present

## 2019-05-25 DIAGNOSIS — Z7901 Long term (current) use of anticoagulants: Secondary | ICD-10-CM | POA: Diagnosis not present

## 2019-05-26 DIAGNOSIS — R0600 Dyspnea, unspecified: Secondary | ICD-10-CM | POA: Diagnosis not present

## 2019-05-26 DIAGNOSIS — R0789 Other chest pain: Secondary | ICD-10-CM | POA: Diagnosis not present

## 2019-05-26 DIAGNOSIS — I4819 Other persistent atrial fibrillation: Secondary | ICD-10-CM | POA: Diagnosis not present

## 2019-05-26 DIAGNOSIS — I480 Paroxysmal atrial fibrillation: Secondary | ICD-10-CM | POA: Diagnosis not present

## 2019-05-28 DIAGNOSIS — I4430 Unspecified atrioventricular block: Secondary | ICD-10-CM | POA: Diagnosis not present

## 2019-05-28 DIAGNOSIS — I4892 Unspecified atrial flutter: Secondary | ICD-10-CM | POA: Diagnosis not present

## 2019-05-28 DIAGNOSIS — R9431 Abnormal electrocardiogram [ECG] [EKG]: Secondary | ICD-10-CM | POA: Diagnosis not present

## 2019-05-31 DIAGNOSIS — I480 Paroxysmal atrial fibrillation: Secondary | ICD-10-CM | POA: Diagnosis not present

## 2019-05-31 DIAGNOSIS — Z7901 Long term (current) use of anticoagulants: Secondary | ICD-10-CM | POA: Diagnosis not present

## 2019-05-31 DIAGNOSIS — I4819 Other persistent atrial fibrillation: Secondary | ICD-10-CM | POA: Diagnosis not present

## 2019-05-31 DIAGNOSIS — G4733 Obstructive sleep apnea (adult) (pediatric): Secondary | ICD-10-CM | POA: Diagnosis not present

## 2019-05-31 DIAGNOSIS — E1100 Type 2 diabetes mellitus with hyperosmolarity without nonketotic hyperglycemic-hyperosmolar coma (NKHHC): Secondary | ICD-10-CM | POA: Diagnosis not present

## 2019-05-31 DIAGNOSIS — I83812 Varicose veins of left lower extremities with pain: Secondary | ICD-10-CM | POA: Diagnosis not present

## 2019-06-02 DIAGNOSIS — Z7901 Long term (current) use of anticoagulants: Secondary | ICD-10-CM | POA: Diagnosis not present

## 2019-06-06 DIAGNOSIS — R0789 Other chest pain: Secondary | ICD-10-CM | POA: Diagnosis not present

## 2019-06-06 DIAGNOSIS — R0600 Dyspnea, unspecified: Secondary | ICD-10-CM | POA: Diagnosis not present

## 2019-06-13 DIAGNOSIS — M1612 Unilateral primary osteoarthritis, left hip: Secondary | ICD-10-CM | POA: Diagnosis not present

## 2019-06-13 DIAGNOSIS — M25552 Pain in left hip: Secondary | ICD-10-CM | POA: Diagnosis not present

## 2019-06-13 DIAGNOSIS — M7062 Trochanteric bursitis, left hip: Secondary | ICD-10-CM | POA: Diagnosis not present

## 2019-06-14 DIAGNOSIS — M7062 Trochanteric bursitis, left hip: Secondary | ICD-10-CM | POA: Insufficient documentation

## 2019-06-14 DIAGNOSIS — M1612 Unilateral primary osteoarthritis, left hip: Secondary | ICD-10-CM | POA: Insufficient documentation

## 2019-06-20 DIAGNOSIS — R768 Other specified abnormal immunological findings in serum: Secondary | ICD-10-CM | POA: Diagnosis not present

## 2019-06-20 DIAGNOSIS — Z01812 Encounter for preprocedural laboratory examination: Secondary | ICD-10-CM | POA: Diagnosis not present

## 2019-06-20 DIAGNOSIS — Z20828 Contact with and (suspected) exposure to other viral communicable diseases: Secondary | ICD-10-CM | POA: Diagnosis not present

## 2019-06-20 DIAGNOSIS — I480 Paroxysmal atrial fibrillation: Secondary | ICD-10-CM | POA: Diagnosis not present

## 2019-06-20 DIAGNOSIS — R0602 Shortness of breath: Secondary | ICD-10-CM | POA: Diagnosis not present

## 2019-06-27 DIAGNOSIS — I480 Paroxysmal atrial fibrillation: Secondary | ICD-10-CM | POA: Diagnosis not present

## 2019-06-27 DIAGNOSIS — R0602 Shortness of breath: Secondary | ICD-10-CM | POA: Diagnosis not present

## 2019-06-28 DIAGNOSIS — Z789 Other specified health status: Secondary | ICD-10-CM | POA: Diagnosis not present

## 2019-06-28 DIAGNOSIS — J9601 Acute respiratory failure with hypoxia: Secondary | ICD-10-CM | POA: Diagnosis not present

## 2019-06-28 DIAGNOSIS — E1100 Type 2 diabetes mellitus with hyperosmolarity without nonketotic hyperglycemic-hyperosmolar coma (NKHHC): Secondary | ICD-10-CM | POA: Diagnosis not present

## 2019-06-28 DIAGNOSIS — I10 Essential (primary) hypertension: Secondary | ICD-10-CM | POA: Diagnosis not present

## 2019-06-28 DIAGNOSIS — I4819 Other persistent atrial fibrillation: Secondary | ICD-10-CM | POA: Diagnosis not present

## 2019-06-28 DIAGNOSIS — I872 Venous insufficiency (chronic) (peripheral): Secondary | ICD-10-CM | POA: Diagnosis not present

## 2019-06-28 DIAGNOSIS — M7062 Trochanteric bursitis, left hip: Secondary | ICD-10-CM | POA: Diagnosis not present

## 2019-06-28 DIAGNOSIS — G4733 Obstructive sleep apnea (adult) (pediatric): Secondary | ICD-10-CM | POA: Diagnosis not present

## 2019-06-28 DIAGNOSIS — M5126 Other intervertebral disc displacement, lumbar region: Secondary | ICD-10-CM | POA: Diagnosis not present

## 2019-06-28 DIAGNOSIS — K219 Gastro-esophageal reflux disease without esophagitis: Secondary | ICD-10-CM | POA: Diagnosis not present

## 2019-06-28 DIAGNOSIS — I83812 Varicose veins of left lower extremities with pain: Secondary | ICD-10-CM | POA: Diagnosis not present

## 2019-06-28 DIAGNOSIS — M1612 Unilateral primary osteoarthritis, left hip: Secondary | ICD-10-CM | POA: Diagnosis not present

## 2019-07-03 DIAGNOSIS — Z7901 Long term (current) use of anticoagulants: Secondary | ICD-10-CM | POA: Diagnosis not present

## 2019-07-03 DIAGNOSIS — Z5181 Encounter for therapeutic drug level monitoring: Secondary | ICD-10-CM | POA: Diagnosis not present

## 2019-07-17 DIAGNOSIS — M7062 Trochanteric bursitis, left hip: Secondary | ICD-10-CM | POA: Diagnosis not present

## 2019-07-18 DIAGNOSIS — I10 Essential (primary) hypertension: Secondary | ICD-10-CM | POA: Diagnosis not present

## 2019-07-18 DIAGNOSIS — K219 Gastro-esophageal reflux disease without esophagitis: Secondary | ICD-10-CM | POA: Diagnosis not present

## 2019-07-18 DIAGNOSIS — M940 Chondrocostal junction syndrome [Tietze]: Secondary | ICD-10-CM | POA: Diagnosis not present

## 2019-07-21 DIAGNOSIS — G4733 Obstructive sleep apnea (adult) (pediatric): Secondary | ICD-10-CM | POA: Diagnosis not present

## 2019-07-21 DIAGNOSIS — Z20828 Contact with and (suspected) exposure to other viral communicable diseases: Secondary | ICD-10-CM | POA: Diagnosis not present

## 2019-07-21 DIAGNOSIS — Z01812 Encounter for preprocedural laboratory examination: Secondary | ICD-10-CM | POA: Diagnosis not present

## 2019-07-21 DIAGNOSIS — Z1159 Encounter for screening for other viral diseases: Secondary | ICD-10-CM | POA: Diagnosis not present

## 2019-07-22 DIAGNOSIS — I4819 Other persistent atrial fibrillation: Secondary | ICD-10-CM | POA: Diagnosis not present

## 2019-07-26 DIAGNOSIS — G4733 Obstructive sleep apnea (adult) (pediatric): Secondary | ICD-10-CM | POA: Diagnosis not present

## 2019-07-26 DIAGNOSIS — I480 Paroxysmal atrial fibrillation: Secondary | ICD-10-CM | POA: Diagnosis not present

## 2019-07-26 DIAGNOSIS — Z789 Other specified health status: Secondary | ICD-10-CM | POA: Diagnosis not present

## 2019-07-27 DIAGNOSIS — I4819 Other persistent atrial fibrillation: Secondary | ICD-10-CM | POA: Diagnosis not present

## 2019-08-16 DIAGNOSIS — F5104 Psychophysiologic insomnia: Secondary | ICD-10-CM | POA: Diagnosis not present

## 2019-08-16 DIAGNOSIS — G2581 Restless legs syndrome: Secondary | ICD-10-CM | POA: Diagnosis not present

## 2019-08-17 DIAGNOSIS — U071 COVID-19: Secondary | ICD-10-CM | POA: Diagnosis not present

## 2019-08-21 DIAGNOSIS — Z9981 Dependence on supplemental oxygen: Secondary | ICD-10-CM | POA: Diagnosis not present

## 2019-08-21 DIAGNOSIS — I110 Hypertensive heart disease with heart failure: Secondary | ICD-10-CM | POA: Diagnosis not present

## 2019-08-21 DIAGNOSIS — R05 Cough: Secondary | ICD-10-CM | POA: Diagnosis not present

## 2019-08-21 DIAGNOSIS — I480 Paroxysmal atrial fibrillation: Secondary | ICD-10-CM | POA: Diagnosis not present

## 2019-08-21 DIAGNOSIS — A4189 Other specified sepsis: Secondary | ICD-10-CM | POA: Diagnosis not present

## 2019-08-21 DIAGNOSIS — U071 COVID-19: Secondary | ICD-10-CM | POA: Diagnosis not present

## 2019-08-21 DIAGNOSIS — I517 Cardiomegaly: Secondary | ICD-10-CM | POA: Diagnosis not present

## 2019-08-21 DIAGNOSIS — G4733 Obstructive sleep apnea (adult) (pediatric): Secondary | ICD-10-CM | POA: Diagnosis not present

## 2019-08-21 DIAGNOSIS — I4819 Other persistent atrial fibrillation: Secondary | ICD-10-CM | POA: Diagnosis not present

## 2019-08-21 DIAGNOSIS — K219 Gastro-esophageal reflux disease without esophagitis: Secondary | ICD-10-CM | POA: Diagnosis not present

## 2019-08-21 DIAGNOSIS — Z7901 Long term (current) use of anticoagulants: Secondary | ICD-10-CM | POA: Diagnosis not present

## 2019-08-21 DIAGNOSIS — Z7982 Long term (current) use of aspirin: Secondary | ICD-10-CM | POA: Diagnosis not present

## 2019-08-21 DIAGNOSIS — F329 Major depressive disorder, single episode, unspecified: Secondary | ICD-10-CM | POA: Diagnosis not present

## 2019-08-21 DIAGNOSIS — I272 Pulmonary hypertension, unspecified: Secondary | ICD-10-CM | POA: Diagnosis not present

## 2019-08-21 DIAGNOSIS — R6520 Severe sepsis without septic shock: Secondary | ICD-10-CM | POA: Diagnosis not present

## 2019-08-21 DIAGNOSIS — I4891 Unspecified atrial fibrillation: Secondary | ICD-10-CM | POA: Diagnosis not present

## 2019-08-21 DIAGNOSIS — G2581 Restless legs syndrome: Secondary | ICD-10-CM | POA: Diagnosis not present

## 2019-08-21 DIAGNOSIS — I5030 Unspecified diastolic (congestive) heart failure: Secondary | ICD-10-CM | POA: Diagnosis not present

## 2019-08-21 DIAGNOSIS — R509 Fever, unspecified: Secondary | ICD-10-CM | POA: Diagnosis not present

## 2019-08-21 DIAGNOSIS — J9601 Acute respiratory failure with hypoxia: Secondary | ICD-10-CM | POA: Diagnosis not present

## 2019-08-21 DIAGNOSIS — N3000 Acute cystitis without hematuria: Secondary | ICD-10-CM | POA: Diagnosis not present

## 2019-08-21 DIAGNOSIS — E119 Type 2 diabetes mellitus without complications: Secondary | ICD-10-CM | POA: Diagnosis not present

## 2019-08-21 DIAGNOSIS — E871 Hypo-osmolality and hyponatremia: Secondary | ICD-10-CM | POA: Diagnosis not present

## 2019-08-21 DIAGNOSIS — J1289 Other viral pneumonia: Secondary | ICD-10-CM | POA: Diagnosis not present

## 2019-08-21 DIAGNOSIS — R9431 Abnormal electrocardiogram [ECG] [EKG]: Secondary | ICD-10-CM | POA: Diagnosis not present

## 2019-08-21 DIAGNOSIS — R0602 Shortness of breath: Secondary | ICD-10-CM | POA: Diagnosis not present

## 2019-08-21 DIAGNOSIS — F419 Anxiety disorder, unspecified: Secondary | ICD-10-CM | POA: Diagnosis not present

## 2019-08-21 DIAGNOSIS — R918 Other nonspecific abnormal finding of lung field: Secondary | ICD-10-CM | POA: Diagnosis not present

## 2019-08-27 DIAGNOSIS — I4891 Unspecified atrial fibrillation: Secondary | ICD-10-CM | POA: Diagnosis not present

## 2019-09-01 DIAGNOSIS — S0990XA Unspecified injury of head, initial encounter: Secondary | ICD-10-CM | POA: Diagnosis not present

## 2019-09-01 DIAGNOSIS — Z7901 Long term (current) use of anticoagulants: Secondary | ICD-10-CM | POA: Diagnosis not present

## 2019-09-01 DIAGNOSIS — J9600 Acute respiratory failure, unspecified whether with hypoxia or hypercapnia: Secondary | ICD-10-CM | POA: Diagnosis not present

## 2019-09-01 DIAGNOSIS — S5011XA Contusion of right forearm, initial encounter: Secondary | ICD-10-CM | POA: Diagnosis not present

## 2019-09-01 DIAGNOSIS — G4733 Obstructive sleep apnea (adult) (pediatric): Secondary | ICD-10-CM | POA: Diagnosis not present

## 2019-09-01 DIAGNOSIS — F329 Major depressive disorder, single episode, unspecified: Secondary | ICD-10-CM | POA: Diagnosis not present

## 2019-09-01 DIAGNOSIS — R05 Cough: Secondary | ICD-10-CM | POA: Diagnosis not present

## 2019-09-01 DIAGNOSIS — G2581 Restless legs syndrome: Secondary | ICD-10-CM | POA: Diagnosis not present

## 2019-09-01 DIAGNOSIS — J9601 Acute respiratory failure with hypoxia: Secondary | ICD-10-CM | POA: Diagnosis not present

## 2019-09-01 DIAGNOSIS — T380X5A Adverse effect of glucocorticoids and synthetic analogues, initial encounter: Secondary | ICD-10-CM | POA: Diagnosis not present

## 2019-09-01 DIAGNOSIS — I4891 Unspecified atrial fibrillation: Secondary | ICD-10-CM | POA: Diagnosis not present

## 2019-09-01 DIAGNOSIS — D689 Coagulation defect, unspecified: Secondary | ICD-10-CM | POA: Diagnosis not present

## 2019-09-01 DIAGNOSIS — R791 Abnormal coagulation profile: Secondary | ICD-10-CM | POA: Diagnosis not present

## 2019-09-01 DIAGNOSIS — W01198A Fall on same level from slipping, tripping and stumbling with subsequent striking against other object, initial encounter: Secondary | ICD-10-CM | POA: Diagnosis not present

## 2019-09-01 DIAGNOSIS — E785 Hyperlipidemia, unspecified: Secondary | ICD-10-CM | POA: Diagnosis not present

## 2019-09-01 DIAGNOSIS — J1289 Other viral pneumonia: Secondary | ICD-10-CM | POA: Diagnosis not present

## 2019-09-01 DIAGNOSIS — E1165 Type 2 diabetes mellitus with hyperglycemia: Secondary | ICD-10-CM | POA: Diagnosis not present

## 2019-09-01 DIAGNOSIS — I480 Paroxysmal atrial fibrillation: Secondary | ICD-10-CM | POA: Diagnosis not present

## 2019-09-01 DIAGNOSIS — R0602 Shortness of breath: Secondary | ICD-10-CM | POA: Diagnosis not present

## 2019-09-01 DIAGNOSIS — J189 Pneumonia, unspecified organism: Secondary | ICD-10-CM | POA: Diagnosis not present

## 2019-09-01 DIAGNOSIS — R9431 Abnormal electrocardiogram [ECG] [EKG]: Secondary | ICD-10-CM | POA: Diagnosis not present

## 2019-09-01 DIAGNOSIS — R0902 Hypoxemia: Secondary | ICD-10-CM | POA: Diagnosis not present

## 2019-09-01 DIAGNOSIS — Z9981 Dependence on supplemental oxygen: Secondary | ICD-10-CM | POA: Diagnosis not present

## 2019-09-01 DIAGNOSIS — I10 Essential (primary) hypertension: Secondary | ICD-10-CM | POA: Diagnosis not present

## 2019-09-01 DIAGNOSIS — Y998 Other external cause status: Secondary | ICD-10-CM | POA: Diagnosis not present

## 2019-09-01 DIAGNOSIS — Z7982 Long term (current) use of aspirin: Secondary | ICD-10-CM | POA: Diagnosis not present

## 2019-09-01 DIAGNOSIS — U071 COVID-19: Secondary | ICD-10-CM | POA: Diagnosis not present

## 2019-09-06 DIAGNOSIS — E1165 Type 2 diabetes mellitus with hyperglycemia: Secondary | ICD-10-CM | POA: Diagnosis not present

## 2019-09-06 DIAGNOSIS — I10 Essential (primary) hypertension: Secondary | ICD-10-CM | POA: Diagnosis not present

## 2019-09-08 DIAGNOSIS — J9601 Acute respiratory failure with hypoxia: Secondary | ICD-10-CM | POA: Diagnosis not present

## 2019-09-08 DIAGNOSIS — U071 COVID-19: Secondary | ICD-10-CM | POA: Diagnosis not present

## 2019-09-12 DIAGNOSIS — Z7901 Long term (current) use of anticoagulants: Secondary | ICD-10-CM | POA: Diagnosis not present

## 2019-09-12 DIAGNOSIS — R0600 Dyspnea, unspecified: Secondary | ICD-10-CM | POA: Diagnosis not present

## 2019-09-12 DIAGNOSIS — R0789 Other chest pain: Secondary | ICD-10-CM | POA: Diagnosis not present

## 2019-09-12 DIAGNOSIS — R079 Chest pain, unspecified: Secondary | ICD-10-CM | POA: Diagnosis not present

## 2019-09-12 DIAGNOSIS — D72829 Elevated white blood cell count, unspecified: Secondary | ICD-10-CM | POA: Diagnosis not present

## 2019-09-12 DIAGNOSIS — R05 Cough: Secondary | ICD-10-CM | POA: Diagnosis not present

## 2019-09-12 DIAGNOSIS — R0602 Shortness of breath: Secondary | ICD-10-CM | POA: Diagnosis not present

## 2019-09-19 DIAGNOSIS — I4819 Other persistent atrial fibrillation: Secondary | ICD-10-CM | POA: Diagnosis not present

## 2019-09-19 DIAGNOSIS — J9601 Acute respiratory failure with hypoxia: Secondary | ICD-10-CM | POA: Diagnosis not present

## 2019-09-19 DIAGNOSIS — R0600 Dyspnea, unspecified: Secondary | ICD-10-CM | POA: Diagnosis not present

## 2019-09-26 DIAGNOSIS — R0602 Shortness of breath: Secondary | ICD-10-CM | POA: Diagnosis not present

## 2019-10-11 DIAGNOSIS — I517 Cardiomegaly: Secondary | ICD-10-CM | POA: Diagnosis not present

## 2019-10-11 DIAGNOSIS — J90 Pleural effusion, not elsewhere classified: Secondary | ICD-10-CM | POA: Diagnosis not present

## 2019-10-11 DIAGNOSIS — R0602 Shortness of breath: Secondary | ICD-10-CM | POA: Diagnosis not present

## 2019-10-11 DIAGNOSIS — I501 Left ventricular failure: Secondary | ICD-10-CM | POA: Diagnosis not present

## 2019-10-11 DIAGNOSIS — I509 Heart failure, unspecified: Secondary | ICD-10-CM | POA: Diagnosis not present

## 2019-10-12 DIAGNOSIS — Z1159 Encounter for screening for other viral diseases: Secondary | ICD-10-CM | POA: Diagnosis not present

## 2019-10-12 DIAGNOSIS — G4733 Obstructive sleep apnea (adult) (pediatric): Secondary | ICD-10-CM | POA: Diagnosis not present

## 2019-10-12 DIAGNOSIS — Z01812 Encounter for preprocedural laboratory examination: Secondary | ICD-10-CM | POA: Diagnosis not present

## 2019-10-12 DIAGNOSIS — Z20828 Contact with and (suspected) exposure to other viral communicable diseases: Secondary | ICD-10-CM | POA: Diagnosis not present

## 2019-10-16 DIAGNOSIS — R0902 Hypoxemia: Secondary | ICD-10-CM | POA: Diagnosis not present

## 2019-10-16 DIAGNOSIS — G4733 Obstructive sleep apnea (adult) (pediatric): Secondary | ICD-10-CM | POA: Diagnosis not present

## 2019-10-16 DIAGNOSIS — G4761 Periodic limb movement disorder: Secondary | ICD-10-CM | POA: Diagnosis not present

## 2019-10-18 DIAGNOSIS — I5030 Unspecified diastolic (congestive) heart failure: Secondary | ICD-10-CM | POA: Diagnosis not present

## 2019-10-23 DIAGNOSIS — I272 Pulmonary hypertension, unspecified: Secondary | ICD-10-CM | POA: Diagnosis not present

## 2019-10-23 DIAGNOSIS — U071 COVID-19: Secondary | ICD-10-CM | POA: Diagnosis not present

## 2019-10-23 DIAGNOSIS — I509 Heart failure, unspecified: Secondary | ICD-10-CM | POA: Diagnosis not present

## 2019-10-24 DIAGNOSIS — Z7901 Long term (current) use of anticoagulants: Secondary | ICD-10-CM | POA: Diagnosis not present

## 2019-10-25 DIAGNOSIS — R609 Edema, unspecified: Secondary | ICD-10-CM | POA: Diagnosis not present

## 2019-10-25 DIAGNOSIS — Z7951 Long term (current) use of inhaled steroids: Secondary | ICD-10-CM | POA: Diagnosis not present

## 2019-10-25 DIAGNOSIS — J9601 Acute respiratory failure with hypoxia: Secondary | ICD-10-CM | POA: Diagnosis not present

## 2019-10-25 DIAGNOSIS — I5032 Chronic diastolic (congestive) heart failure: Secondary | ICD-10-CM | POA: Diagnosis not present

## 2019-10-25 DIAGNOSIS — Z79899 Other long term (current) drug therapy: Secondary | ICD-10-CM | POA: Diagnosis not present

## 2019-10-25 DIAGNOSIS — Z7984 Long term (current) use of oral hypoglycemic drugs: Secondary | ICD-10-CM | POA: Diagnosis not present

## 2019-10-25 DIAGNOSIS — I2729 Other secondary pulmonary hypertension: Secondary | ICD-10-CM | POA: Diagnosis not present

## 2019-10-25 DIAGNOSIS — I480 Paroxysmal atrial fibrillation: Secondary | ICD-10-CM | POA: Diagnosis not present

## 2019-10-25 DIAGNOSIS — Z7982 Long term (current) use of aspirin: Secondary | ICD-10-CM | POA: Diagnosis not present

## 2019-10-25 DIAGNOSIS — R768 Other specified abnormal immunological findings in serum: Secondary | ICD-10-CM | POA: Diagnosis not present

## 2019-10-25 DIAGNOSIS — I5033 Acute on chronic diastolic (congestive) heart failure: Secondary | ICD-10-CM | POA: Diagnosis not present

## 2019-10-25 DIAGNOSIS — G4733 Obstructive sleep apnea (adult) (pediatric): Secondary | ICD-10-CM | POA: Diagnosis not present

## 2019-10-25 DIAGNOSIS — I4819 Other persistent atrial fibrillation: Secondary | ICD-10-CM | POA: Diagnosis not present

## 2019-10-25 DIAGNOSIS — J810 Acute pulmonary edema: Secondary | ICD-10-CM | POA: Diagnosis not present

## 2019-10-25 DIAGNOSIS — Z7901 Long term (current) use of anticoagulants: Secondary | ICD-10-CM | POA: Diagnosis not present

## 2019-10-25 DIAGNOSIS — R05 Cough: Secondary | ICD-10-CM | POA: Diagnosis not present

## 2019-10-25 DIAGNOSIS — Z8619 Personal history of other infectious and parasitic diseases: Secondary | ICD-10-CM | POA: Diagnosis not present

## 2019-10-25 DIAGNOSIS — J90 Pleural effusion, not elsewhere classified: Secondary | ICD-10-CM | POA: Diagnosis not present

## 2019-10-25 DIAGNOSIS — Z8701 Personal history of pneumonia (recurrent): Secondary | ICD-10-CM | POA: Diagnosis not present

## 2019-10-25 DIAGNOSIS — R0602 Shortness of breath: Secondary | ICD-10-CM | POA: Diagnosis not present

## 2019-10-25 DIAGNOSIS — I110 Hypertensive heart disease with heart failure: Secondary | ICD-10-CM | POA: Diagnosis not present

## 2019-10-25 DIAGNOSIS — I872 Venous insufficiency (chronic) (peripheral): Secondary | ICD-10-CM | POA: Diagnosis not present

## 2019-10-25 DIAGNOSIS — U071 COVID-19: Secondary | ICD-10-CM | POA: Diagnosis not present

## 2019-10-25 DIAGNOSIS — E119 Type 2 diabetes mellitus without complications: Secondary | ICD-10-CM | POA: Diagnosis not present

## 2019-10-30 DIAGNOSIS — I4891 Unspecified atrial fibrillation: Secondary | ICD-10-CM | POA: Diagnosis not present

## 2019-10-30 DIAGNOSIS — R9431 Abnormal electrocardiogram [ECG] [EKG]: Secondary | ICD-10-CM | POA: Diagnosis not present

## 2019-10-31 DIAGNOSIS — R0602 Shortness of breath: Secondary | ICD-10-CM | POA: Diagnosis not present

## 2019-11-01 DIAGNOSIS — I361 Nonrheumatic tricuspid (valve) insufficiency: Secondary | ICD-10-CM | POA: Diagnosis not present

## 2019-11-02 DIAGNOSIS — J90 Pleural effusion, not elsewhere classified: Secondary | ICD-10-CM | POA: Diagnosis not present

## 2019-11-02 DIAGNOSIS — R768 Other specified abnormal immunological findings in serum: Secondary | ICD-10-CM | POA: Diagnosis not present

## 2019-11-15 DIAGNOSIS — G4733 Obstructive sleep apnea (adult) (pediatric): Secondary | ICD-10-CM | POA: Diagnosis not present

## 2019-11-23 DIAGNOSIS — U071 COVID-19: Secondary | ICD-10-CM | POA: Diagnosis not present

## 2019-11-23 DIAGNOSIS — I509 Heart failure, unspecified: Secondary | ICD-10-CM | POA: Diagnosis not present

## 2019-11-23 DIAGNOSIS — I272 Pulmonary hypertension, unspecified: Secondary | ICD-10-CM | POA: Diagnosis not present

## 2019-11-24 DIAGNOSIS — G4733 Obstructive sleep apnea (adult) (pediatric): Secondary | ICD-10-CM | POA: Diagnosis not present

## 2019-11-24 DIAGNOSIS — I10 Essential (primary) hypertension: Secondary | ICD-10-CM | POA: Diagnosis not present

## 2019-11-24 DIAGNOSIS — I272 Pulmonary hypertension, unspecified: Secondary | ICD-10-CM | POA: Insufficient documentation

## 2019-11-24 DIAGNOSIS — J90 Pleural effusion, not elsewhere classified: Secondary | ICD-10-CM | POA: Diagnosis not present

## 2019-11-24 DIAGNOSIS — R0602 Shortness of breath: Secondary | ICD-10-CM | POA: Diagnosis not present

## 2019-11-24 DIAGNOSIS — R918 Other nonspecific abnormal finding of lung field: Secondary | ICD-10-CM | POA: Diagnosis not present

## 2019-11-24 DIAGNOSIS — Z7901 Long term (current) use of anticoagulants: Secondary | ICD-10-CM | POA: Diagnosis not present

## 2019-11-24 DIAGNOSIS — I4819 Other persistent atrial fibrillation: Secondary | ICD-10-CM | POA: Diagnosis not present

## 2019-11-27 DIAGNOSIS — Z23 Encounter for immunization: Secondary | ICD-10-CM | POA: Diagnosis not present

## 2019-11-27 DIAGNOSIS — Z7901 Long term (current) use of anticoagulants: Secondary | ICD-10-CM | POA: Diagnosis not present

## 2019-12-05 DIAGNOSIS — I272 Pulmonary hypertension, unspecified: Secondary | ICD-10-CM | POA: Diagnosis not present

## 2019-12-05 DIAGNOSIS — G4733 Obstructive sleep apnea (adult) (pediatric): Secondary | ICD-10-CM | POA: Diagnosis not present

## 2019-12-05 DIAGNOSIS — Z539 Procedure and treatment not carried out, unspecified reason: Secondary | ICD-10-CM | POA: Diagnosis not present

## 2019-12-05 DIAGNOSIS — I10 Essential (primary) hypertension: Secondary | ICD-10-CM | POA: Diagnosis not present

## 2019-12-05 DIAGNOSIS — Z7901 Long term (current) use of anticoagulants: Secondary | ICD-10-CM | POA: Diagnosis not present

## 2019-12-05 DIAGNOSIS — I4819 Other persistent atrial fibrillation: Secondary | ICD-10-CM | POA: Diagnosis not present

## 2019-12-08 DIAGNOSIS — Z7901 Long term (current) use of anticoagulants: Secondary | ICD-10-CM | POA: Diagnosis not present

## 2019-12-08 DIAGNOSIS — Z79899 Other long term (current) drug therapy: Secondary | ICD-10-CM | POA: Diagnosis not present

## 2019-12-08 DIAGNOSIS — I272 Pulmonary hypertension, unspecified: Secondary | ICD-10-CM | POA: Diagnosis not present

## 2019-12-08 DIAGNOSIS — I4819 Other persistent atrial fibrillation: Secondary | ICD-10-CM | POA: Diagnosis not present

## 2019-12-08 DIAGNOSIS — G4733 Obstructive sleep apnea (adult) (pediatric): Secondary | ICD-10-CM | POA: Diagnosis not present

## 2019-12-08 DIAGNOSIS — Z8616 Personal history of COVID-19: Secondary | ICD-10-CM | POA: Diagnosis not present

## 2019-12-08 DIAGNOSIS — I10 Essential (primary) hypertension: Secondary | ICD-10-CM | POA: Diagnosis not present

## 2019-12-11 DIAGNOSIS — Z860100 Personal history of colon polyps, unspecified: Secondary | ICD-10-CM | POA: Insufficient documentation

## 2019-12-11 DIAGNOSIS — K582 Mixed irritable bowel syndrome: Secondary | ICD-10-CM | POA: Diagnosis not present

## 2019-12-11 DIAGNOSIS — K219 Gastro-esophageal reflux disease without esophagitis: Secondary | ICD-10-CM | POA: Diagnosis not present

## 2019-12-11 DIAGNOSIS — D696 Thrombocytopenia, unspecified: Secondary | ICD-10-CM | POA: Diagnosis not present

## 2019-12-11 DIAGNOSIS — R1013 Epigastric pain: Secondary | ICD-10-CM | POA: Diagnosis not present

## 2019-12-11 DIAGNOSIS — Z7901 Long term (current) use of anticoagulants: Secondary | ICD-10-CM | POA: Diagnosis not present

## 2019-12-16 DIAGNOSIS — G4733 Obstructive sleep apnea (adult) (pediatric): Secondary | ICD-10-CM | POA: Diagnosis not present

## 2019-12-24 DIAGNOSIS — U071 COVID-19: Secondary | ICD-10-CM | POA: Diagnosis not present

## 2019-12-24 DIAGNOSIS — I509 Heart failure, unspecified: Secondary | ICD-10-CM | POA: Diagnosis not present

## 2019-12-24 DIAGNOSIS — I272 Pulmonary hypertension, unspecified: Secondary | ICD-10-CM | POA: Diagnosis not present

## 2019-12-25 DIAGNOSIS — R945 Abnormal results of liver function studies: Secondary | ICD-10-CM | POA: Diagnosis not present

## 2019-12-25 DIAGNOSIS — R1013 Epigastric pain: Secondary | ICD-10-CM | POA: Diagnosis not present

## 2019-12-25 DIAGNOSIS — J90 Pleural effusion, not elsewhere classified: Secondary | ICD-10-CM | POA: Diagnosis not present

## 2019-12-25 DIAGNOSIS — K7689 Other specified diseases of liver: Secondary | ICD-10-CM | POA: Diagnosis not present

## 2019-12-25 DIAGNOSIS — K7469 Other cirrhosis of liver: Secondary | ICD-10-CM | POA: Diagnosis not present

## 2019-12-25 DIAGNOSIS — R109 Unspecified abdominal pain: Secondary | ICD-10-CM | POA: Diagnosis not present

## 2019-12-27 DIAGNOSIS — Z7901 Long term (current) use of anticoagulants: Secondary | ICD-10-CM | POA: Diagnosis not present

## 2019-12-29 DIAGNOSIS — Z7901 Long term (current) use of anticoagulants: Secondary | ICD-10-CM | POA: Diagnosis not present

## 2019-12-29 DIAGNOSIS — I4819 Other persistent atrial fibrillation: Secondary | ICD-10-CM | POA: Diagnosis not present

## 2019-12-29 DIAGNOSIS — Z9989 Dependence on other enabling machines and devices: Secondary | ICD-10-CM | POA: Diagnosis not present

## 2019-12-29 DIAGNOSIS — Z79899 Other long term (current) drug therapy: Secondary | ICD-10-CM | POA: Diagnosis not present

## 2019-12-29 DIAGNOSIS — G4733 Obstructive sleep apnea (adult) (pediatric): Secondary | ICD-10-CM | POA: Diagnosis not present

## 2019-12-29 DIAGNOSIS — Z7982 Long term (current) use of aspirin: Secondary | ICD-10-CM | POA: Diagnosis not present

## 2019-12-29 DIAGNOSIS — I272 Pulmonary hypertension, unspecified: Secondary | ICD-10-CM | POA: Diagnosis not present

## 2019-12-29 DIAGNOSIS — I5031 Acute diastolic (congestive) heart failure: Secondary | ICD-10-CM | POA: Diagnosis not present

## 2019-12-29 DIAGNOSIS — Z9981 Dependence on supplemental oxygen: Secondary | ICD-10-CM | POA: Diagnosis not present

## 2019-12-29 DIAGNOSIS — I110 Hypertensive heart disease with heart failure: Secondary | ICD-10-CM | POA: Diagnosis not present

## 2020-01-01 DIAGNOSIS — K589 Irritable bowel syndrome without diarrhea: Secondary | ICD-10-CM | POA: Diagnosis not present

## 2020-01-01 DIAGNOSIS — G4733 Obstructive sleep apnea (adult) (pediatric): Secondary | ICD-10-CM | POA: Diagnosis not present

## 2020-01-01 DIAGNOSIS — Z7901 Long term (current) use of anticoagulants: Secondary | ICD-10-CM | POA: Diagnosis not present

## 2020-01-01 DIAGNOSIS — I4819 Other persistent atrial fibrillation: Secondary | ICD-10-CM | POA: Diagnosis not present

## 2020-01-01 DIAGNOSIS — R1013 Epigastric pain: Secondary | ICD-10-CM | POA: Diagnosis not present

## 2020-01-01 DIAGNOSIS — R531 Weakness: Secondary | ICD-10-CM | POA: Diagnosis not present

## 2020-01-01 DIAGNOSIS — I272 Pulmonary hypertension, unspecified: Secondary | ICD-10-CM | POA: Diagnosis not present

## 2020-01-01 DIAGNOSIS — J90 Pleural effusion, not elsewhere classified: Secondary | ICD-10-CM | POA: Diagnosis not present

## 2020-01-01 DIAGNOSIS — I10 Essential (primary) hypertension: Secondary | ICD-10-CM | POA: Diagnosis not present

## 2020-01-01 DIAGNOSIS — Z01812 Encounter for preprocedural laboratory examination: Secondary | ICD-10-CM | POA: Diagnosis not present

## 2020-01-01 DIAGNOSIS — Z20822 Contact with and (suspected) exposure to covid-19: Secondary | ICD-10-CM | POA: Diagnosis not present

## 2020-01-02 DIAGNOSIS — J810 Acute pulmonary edema: Secondary | ICD-10-CM | POA: Diagnosis not present

## 2020-01-02 DIAGNOSIS — R0609 Other forms of dyspnea: Secondary | ICD-10-CM | POA: Diagnosis not present

## 2020-01-02 DIAGNOSIS — J90 Pleural effusion, not elsewhere classified: Secondary | ICD-10-CM | POA: Diagnosis not present

## 2020-01-02 DIAGNOSIS — J849 Interstitial pulmonary disease, unspecified: Secondary | ICD-10-CM | POA: Diagnosis not present

## 2020-01-08 DIAGNOSIS — J9601 Acute respiratory failure with hypoxia: Secondary | ICD-10-CM | POA: Diagnosis not present

## 2020-01-08 DIAGNOSIS — J90 Pleural effusion, not elsewhere classified: Secondary | ICD-10-CM | POA: Diagnosis not present

## 2020-01-08 DIAGNOSIS — J810 Acute pulmonary edema: Secondary | ICD-10-CM | POA: Diagnosis not present

## 2020-01-08 DIAGNOSIS — I5033 Acute on chronic diastolic (congestive) heart failure: Secondary | ICD-10-CM | POA: Diagnosis not present

## 2020-01-09 DIAGNOSIS — I4819 Other persistent atrial fibrillation: Secondary | ICD-10-CM | POA: Diagnosis not present

## 2020-01-13 DIAGNOSIS — G4733 Obstructive sleep apnea (adult) (pediatric): Secondary | ICD-10-CM | POA: Diagnosis not present

## 2020-01-15 DIAGNOSIS — R17 Unspecified jaundice: Secondary | ICD-10-CM | POA: Diagnosis not present

## 2020-01-15 DIAGNOSIS — R748 Abnormal levels of other serum enzymes: Secondary | ICD-10-CM | POA: Diagnosis not present

## 2020-01-15 DIAGNOSIS — K582 Mixed irritable bowel syndrome: Secondary | ICD-10-CM | POA: Diagnosis not present

## 2020-01-15 DIAGNOSIS — R1013 Epigastric pain: Secondary | ICD-10-CM | POA: Diagnosis not present

## 2020-01-15 DIAGNOSIS — J90 Pleural effusion, not elsewhere classified: Secondary | ICD-10-CM | POA: Diagnosis not present

## 2020-01-15 DIAGNOSIS — Z86010 Personal history of colonic polyps: Secondary | ICD-10-CM | POA: Diagnosis not present

## 2020-01-15 DIAGNOSIS — R9389 Abnormal findings on diagnostic imaging of other specified body structures: Secondary | ICD-10-CM | POA: Diagnosis not present

## 2020-01-15 DIAGNOSIS — Z7901 Long term (current) use of anticoagulants: Secondary | ICD-10-CM | POA: Diagnosis not present

## 2020-01-15 DIAGNOSIS — K219 Gastro-esophageal reflux disease without esophagitis: Secondary | ICD-10-CM | POA: Diagnosis not present

## 2020-01-24 DIAGNOSIS — Z7901 Long term (current) use of anticoagulants: Secondary | ICD-10-CM | POA: Diagnosis not present

## 2020-01-24 DIAGNOSIS — I4819 Other persistent atrial fibrillation: Secondary | ICD-10-CM | POA: Diagnosis not present

## 2020-01-29 DIAGNOSIS — Z7901 Long term (current) use of anticoagulants: Secondary | ICD-10-CM | POA: Diagnosis not present

## 2020-01-29 DIAGNOSIS — I272 Pulmonary hypertension, unspecified: Secondary | ICD-10-CM | POA: Diagnosis not present

## 2020-01-29 DIAGNOSIS — I4819 Other persistent atrial fibrillation: Secondary | ICD-10-CM | POA: Diagnosis not present

## 2020-01-29 DIAGNOSIS — I10 Essential (primary) hypertension: Secondary | ICD-10-CM | POA: Diagnosis not present

## 2020-01-29 DIAGNOSIS — R609 Edema, unspecified: Secondary | ICD-10-CM | POA: Diagnosis not present

## 2020-01-30 DIAGNOSIS — I10 Essential (primary) hypertension: Secondary | ICD-10-CM | POA: Diagnosis not present

## 2020-01-30 DIAGNOSIS — I272 Pulmonary hypertension, unspecified: Secondary | ICD-10-CM | POA: Diagnosis not present

## 2020-01-30 DIAGNOSIS — J90 Pleural effusion, not elsewhere classified: Secondary | ICD-10-CM | POA: Diagnosis not present

## 2020-02-07 DIAGNOSIS — Z7901 Long term (current) use of anticoagulants: Secondary | ICD-10-CM | POA: Diagnosis not present

## 2020-02-08 DIAGNOSIS — J9601 Acute respiratory failure with hypoxia: Secondary | ICD-10-CM | POA: Diagnosis not present

## 2020-02-08 DIAGNOSIS — J810 Acute pulmonary edema: Secondary | ICD-10-CM | POA: Diagnosis not present

## 2020-02-08 DIAGNOSIS — I5033 Acute on chronic diastolic (congestive) heart failure: Secondary | ICD-10-CM | POA: Diagnosis not present

## 2020-02-08 DIAGNOSIS — J90 Pleural effusion, not elsewhere classified: Secondary | ICD-10-CM | POA: Diagnosis not present

## 2020-02-13 DIAGNOSIS — G4733 Obstructive sleep apnea (adult) (pediatric): Secondary | ICD-10-CM | POA: Diagnosis not present

## 2020-02-14 DIAGNOSIS — I2722 Pulmonary hypertension due to left heart disease: Secondary | ICD-10-CM | POA: Diagnosis not present

## 2020-02-14 DIAGNOSIS — Z8616 Personal history of COVID-19: Secondary | ICD-10-CM | POA: Diagnosis not present

## 2020-02-14 DIAGNOSIS — Z9989 Dependence on other enabling machines and devices: Secondary | ICD-10-CM | POA: Diagnosis not present

## 2020-02-14 DIAGNOSIS — G4733 Obstructive sleep apnea (adult) (pediatric): Secondary | ICD-10-CM | POA: Diagnosis not present

## 2020-02-14 DIAGNOSIS — K7460 Unspecified cirrhosis of liver: Secondary | ICD-10-CM | POA: Diagnosis not present

## 2020-02-14 DIAGNOSIS — I272 Pulmonary hypertension, unspecified: Secondary | ICD-10-CM | POA: Diagnosis not present

## 2020-02-14 DIAGNOSIS — U071 COVID-19: Secondary | ICD-10-CM | POA: Diagnosis not present

## 2020-02-14 DIAGNOSIS — Z6835 Body mass index (BMI) 35.0-35.9, adult: Secondary | ICD-10-CM | POA: Diagnosis not present

## 2020-02-14 DIAGNOSIS — J90 Pleural effusion, not elsewhere classified: Secondary | ICD-10-CM | POA: Diagnosis not present

## 2020-02-14 DIAGNOSIS — I2723 Pulmonary hypertension due to lung diseases and hypoxia: Secondary | ICD-10-CM | POA: Diagnosis not present

## 2020-02-14 DIAGNOSIS — I4819 Other persistent atrial fibrillation: Secondary | ICD-10-CM | POA: Diagnosis not present

## 2020-02-14 DIAGNOSIS — Z7901 Long term (current) use of anticoagulants: Secondary | ICD-10-CM | POA: Diagnosis not present

## 2020-02-14 DIAGNOSIS — E669 Obesity, unspecified: Secondary | ICD-10-CM | POA: Diagnosis not present

## 2020-02-14 DIAGNOSIS — J9601 Acute respiratory failure with hypoxia: Secondary | ICD-10-CM | POA: Diagnosis not present

## 2020-02-14 DIAGNOSIS — Z9981 Dependence on supplemental oxygen: Secondary | ICD-10-CM | POA: Diagnosis not present

## 2020-02-15 IMAGING — MR MR LUMBAR SPINE W/O CM
5 series · 42 of 48 positions shown · non-contrast
Comparison: CT Abdomen and Pelvis 02/19/2018. [REDACTED]
lumbar MRI 03/12/2017.

CLINICAL DATA: 72-year-old female with back pain, left thigh and
knee pain for 4 months. Progressed symptoms since a fall 2 months
ago. Prior lumbar surgery in 5090.

EXAM:
MRI LUMBAR SPINE WITHOUT CONTRAST
TECHNIQUE: Multiplanar, multisequence MR imaging of the lumbar spine was
performed. No intravenous contrast was administered.

[Series 2: T2 post-contrast · sagittal · 4.0mm · 0.88mm/px · 6 of 12 slices shown]
[im 1/12]
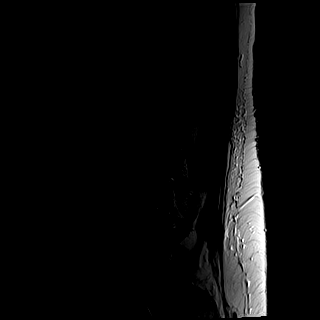
[im 3/12]
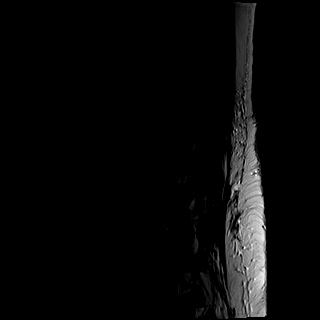
[im 5/12]
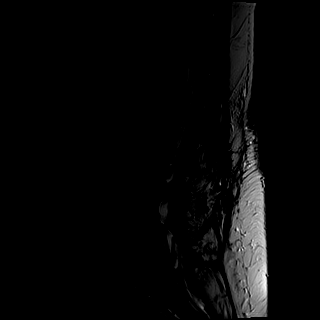
[im 7/12]
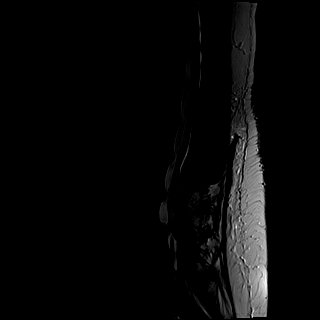
[im 9/12]
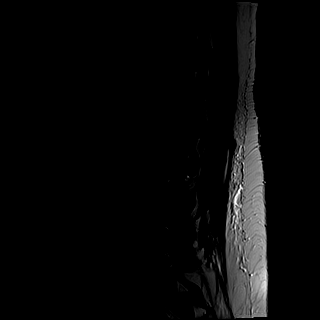
[im 12/12]
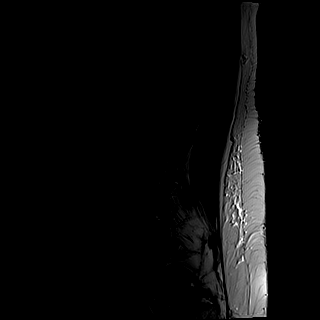

[Series 3: T1 · sagittal · 4.0mm · 0.88mm/px · 5 of 12 slices shown (1 of 2)]
[im 1/12]
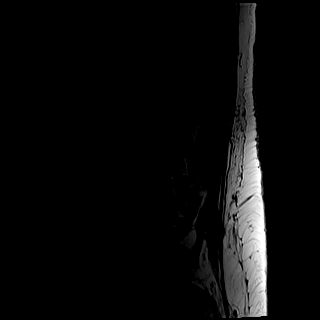
[im 3/12]
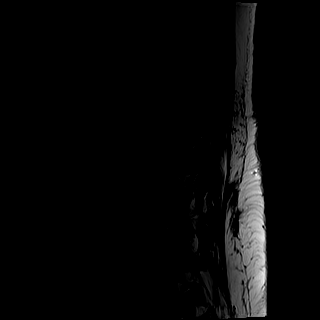
[im 6/12]
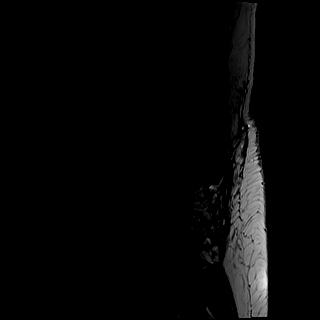
[im 9/12]
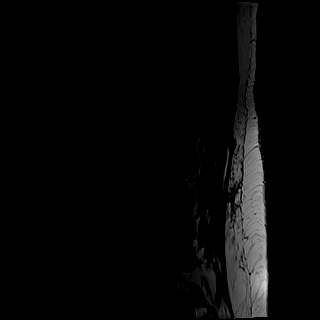
[im 12/12]
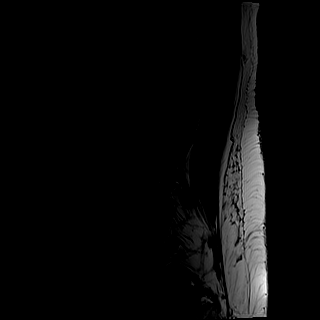

[Series 4: tirm sag · sagittal · 4.0mm · 0.55mm/px · 5 of 12 slices shown]
[im 1/12]
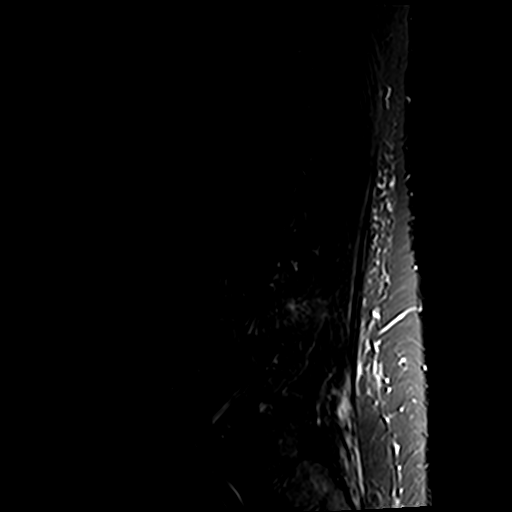
[im 3/12]
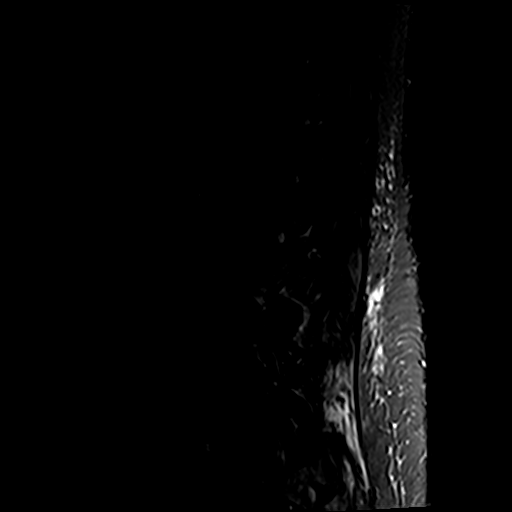
[im 6/12]
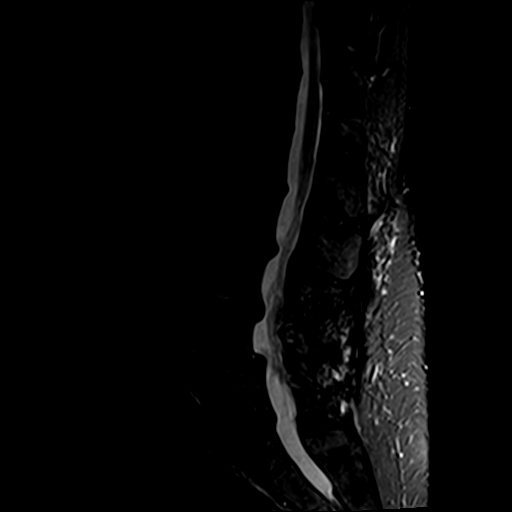
[im 9/12]
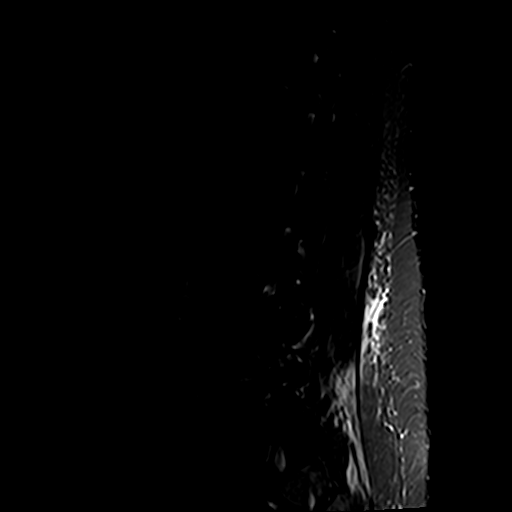
[im 12/12]
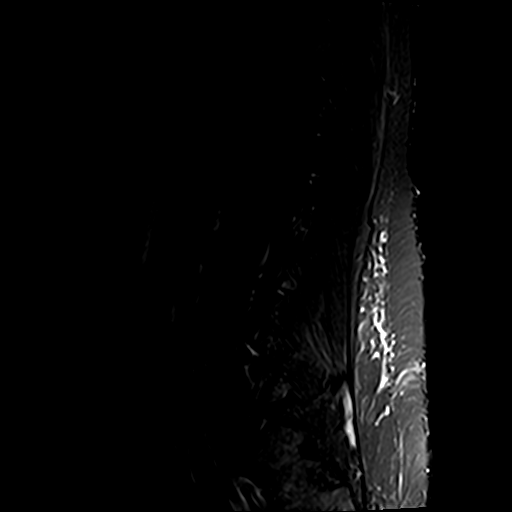

[Series 5: T1 · axial · 4.0mm · 0.78mm/px · z∈[-54,+134]mm · 10 of 35 slices shown (2 of 2)]
[im 3/35]
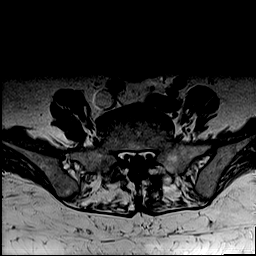
[im 5/35]
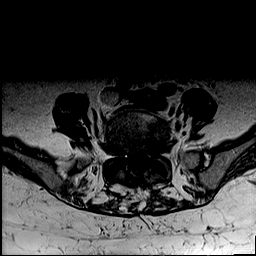
[im 7/35]
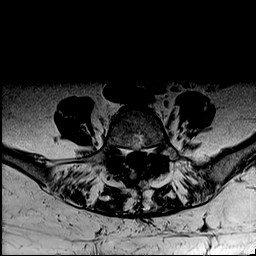
[im 12/35]
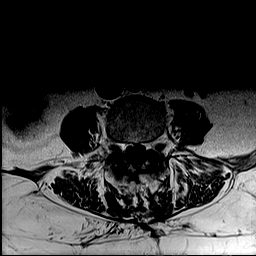
[im 16/35]
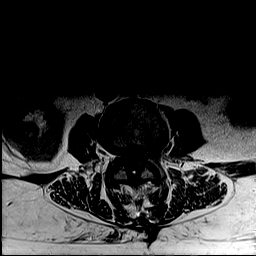
[im 19/35]
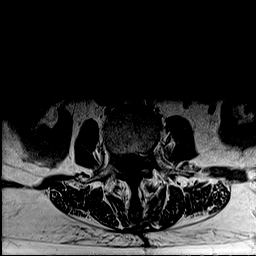
[im 21/35]
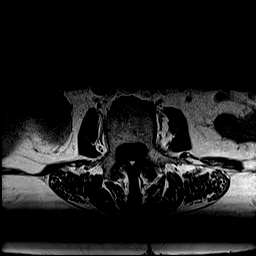
[im 25/35]
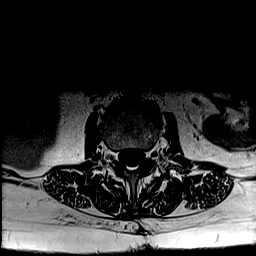
[im 30/35]
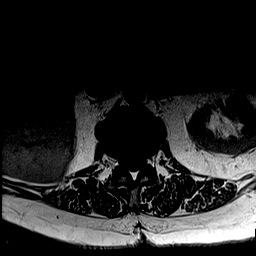
[im 35/35]
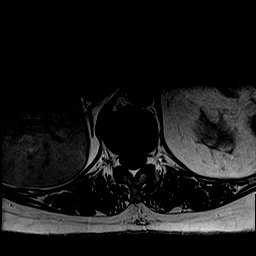

[Series 6: T2 · axial · 4.0mm · 0.78mm/px · z∈[-64,+134]mm · 16 of 35 slices shown]
[im 1/35]
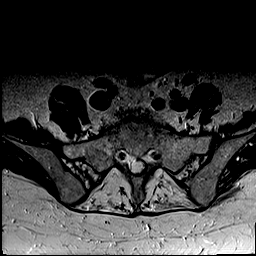
[im 3/35]
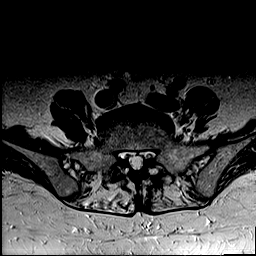
[im 5/35]
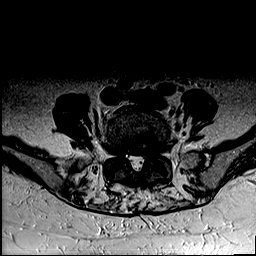
[im 7/35]
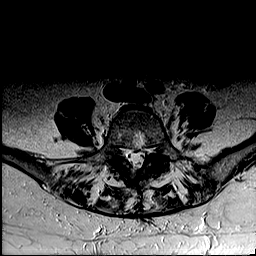
[im 10/35]
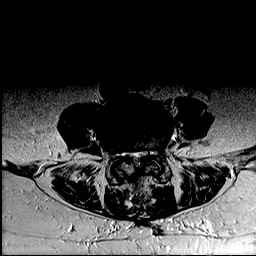
[im 12/35]
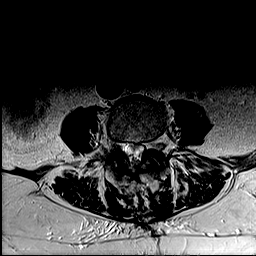
[im 14/35]
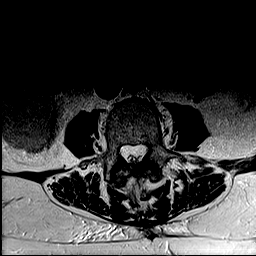
[im 16/35]
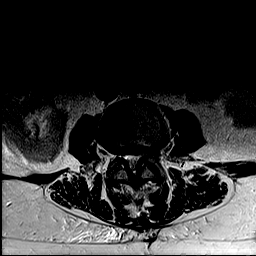
[im 19/35]
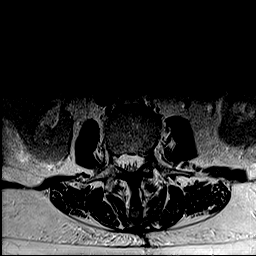
[im 21/35]
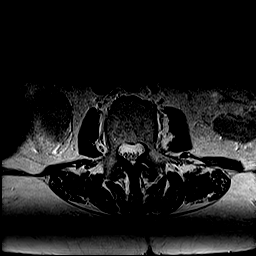
[im 23/35]
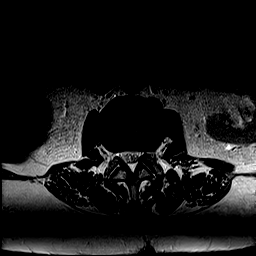
[im 25/35]
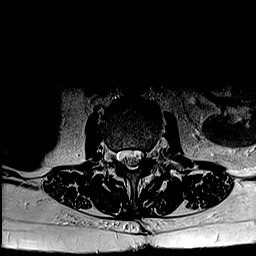
[im 28/35]
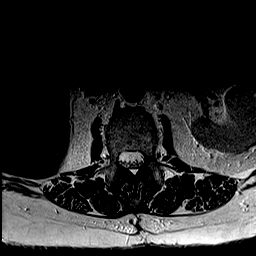
[im 30/35]
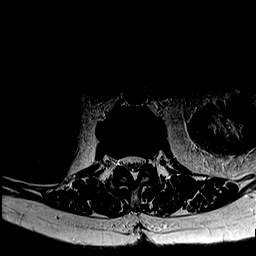
[im 32/35]
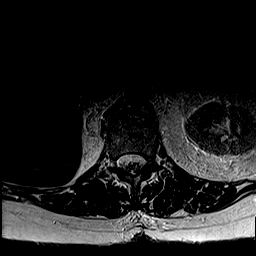
[im 35/35]
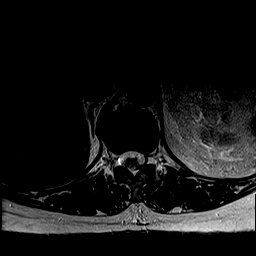

[42 of 48 positions shown; findings below may reference images not displayed]

FINDINGS: Segmentation:  Normal on the CT earlier this year.

Alignment: Stable since 4094. Chronic grade 1 anterolisthesis of L4
on L5 with straightening or mild reversal of upper lumbar lordosis.

Vertebrae: Chronic degenerative endplate changes. No marrow edema or
evidence of acute osseous abnormality. Visualized bone marrow signal
is within normal limits. Intact visible sacrum and SI joints.

Conus medullaris and cauda equina: Conus extends to the L1 level. No
lower spinal cord or conus signal abnormality.

Paraspinal and other soft tissues: Chronic hepatomegaly. Stable
visible abdominal viscera. Stable paraspinal soft tissues, chronic
postoperative changes to the posterior paraspinal soft tissues L3-L4
through L5-S1.

Disc levels:

No lower thoracic spinal stenosis. There is chronic disc bulging and
posterior element hypertrophy at T11-T12.

T12-L1:  Negative.

L1-L2:  Mild disc bulge.  No stenosis.

L2-L3: Chronic circumferential disc bulge with progressed
broad-based posterior component since 4094. Mild facet hypertrophy.
Increased mild spinal stenosis (series 6, image 13).

L3-L4: Chronic circumferential disc bulge and broad-based posterior
disc protrusion with moderate facet and ligament flavum hypertrophy.
Chronic postoperative changes to the lamina. Mild to moderate spinal
stenosis and left greater than right lateral recess stenosis appears
mildly increased since 4094. Furthermore, a left foraminal disc
extrusion is new since 4094, best seen on series 3, image 10 and
series 5, image 18. Associated severe left L3 foraminal stenosis.
Possible 8-9 millimeter extruded disc fragment at the left foramen.

L4-L5: Chronic anterolisthesis with disc space loss, chronic
circumferential disc bulge/pseudo disc, and superimposed chronic
broad-based central disc protrusion (series 6, image 25). Severe
chronic facet hypertrophy. Postoperative changes to the lamina. Mild
to moderate spinal and moderate to severe bilateral lateral recess
stenosis. Mild left and moderate right L4 foraminal stenosis. This
level is stable since 4094.

L5-S1: Chronic disc space loss and circumferential but mostly far
lateral disc osteophyte complex. Moderate facet hypertrophy. Chronic
postoperative changes to the lamina. No spinal stenosis. Mild left
lateral recess stenosis and bilateral L5 foraminal stenosis. This
level is stable.
IMPRESSION: 1. Symptomatic level appears to be L3-L4 where a left foraminal disc
extrusion with possible 8-9 mm sequestered disc fragment is new
since the 4094 MRI. New severe left foraminal stenosis. Query left
L3 radiculitis.
Underlying mild to moderate spinal and lateral recess stenosis at
that level also appears mildly progressed.
2. Stable chronic degenerative and postoperative changes at L4-L5
and L5-S1.
Chronic mild to moderate spinal and moderate to severe bilateral
lateral recess stenosis at L4-L5.
3. Mild progression of chronic L2-L3 disc degeneration with
increased mild spinal stenosis at that level.

## 2020-02-16 DIAGNOSIS — H43811 Vitreous degeneration, right eye: Secondary | ICD-10-CM | POA: Diagnosis not present

## 2020-02-16 DIAGNOSIS — E119 Type 2 diabetes mellitus without complications: Secondary | ICD-10-CM | POA: Diagnosis not present

## 2020-02-16 DIAGNOSIS — H2513 Age-related nuclear cataract, bilateral: Secondary | ICD-10-CM | POA: Diagnosis not present

## 2020-02-23 DIAGNOSIS — I4819 Other persistent atrial fibrillation: Secondary | ICD-10-CM | POA: Diagnosis not present

## 2020-02-23 DIAGNOSIS — R791 Abnormal coagulation profile: Secondary | ICD-10-CM | POA: Diagnosis not present

## 2020-02-23 DIAGNOSIS — Z539 Procedure and treatment not carried out, unspecified reason: Secondary | ICD-10-CM | POA: Diagnosis not present

## 2020-02-27 DIAGNOSIS — J849 Interstitial pulmonary disease, unspecified: Secondary | ICD-10-CM | POA: Diagnosis not present

## 2020-02-27 DIAGNOSIS — R05 Cough: Secondary | ICD-10-CM | POA: Diagnosis not present

## 2020-02-27 DIAGNOSIS — R0609 Other forms of dyspnea: Secondary | ICD-10-CM | POA: Diagnosis not present

## 2020-02-27 DIAGNOSIS — R062 Wheezing: Secondary | ICD-10-CM | POA: Diagnosis not present

## 2020-02-28 DIAGNOSIS — J849 Interstitial pulmonary disease, unspecified: Secondary | ICD-10-CM | POA: Diagnosis not present

## 2020-02-29 DIAGNOSIS — Z7901 Long term (current) use of anticoagulants: Secondary | ICD-10-CM | POA: Diagnosis not present

## 2020-03-01 DIAGNOSIS — E2839 Other primary ovarian failure: Secondary | ICD-10-CM | POA: Diagnosis not present

## 2020-03-01 DIAGNOSIS — Z1231 Encounter for screening mammogram for malignant neoplasm of breast: Secondary | ICD-10-CM | POA: Diagnosis not present

## 2020-03-01 DIAGNOSIS — I10 Essential (primary) hypertension: Secondary | ICD-10-CM | POA: Diagnosis not present

## 2020-03-01 DIAGNOSIS — F419 Anxiety disorder, unspecified: Secondary | ICD-10-CM | POA: Diagnosis not present

## 2020-03-01 DIAGNOSIS — E1165 Type 2 diabetes mellitus with hyperglycemia: Secondary | ICD-10-CM | POA: Diagnosis not present

## 2020-03-04 DIAGNOSIS — R091 Pleurisy: Secondary | ICD-10-CM | POA: Diagnosis not present

## 2020-03-04 DIAGNOSIS — R0602 Shortness of breath: Secondary | ICD-10-CM | POA: Diagnosis not present

## 2020-03-07 DIAGNOSIS — E1165 Type 2 diabetes mellitus with hyperglycemia: Secondary | ICD-10-CM | POA: Diagnosis not present

## 2020-03-07 DIAGNOSIS — I10 Essential (primary) hypertension: Secondary | ICD-10-CM | POA: Diagnosis not present

## 2020-03-08 DIAGNOSIS — J849 Interstitial pulmonary disease, unspecified: Secondary | ICD-10-CM | POA: Diagnosis not present

## 2020-03-09 DIAGNOSIS — J9601 Acute respiratory failure with hypoxia: Secondary | ICD-10-CM | POA: Diagnosis not present

## 2020-03-09 DIAGNOSIS — J810 Acute pulmonary edema: Secondary | ICD-10-CM | POA: Diagnosis not present

## 2020-03-09 DIAGNOSIS — J90 Pleural effusion, not elsewhere classified: Secondary | ICD-10-CM | POA: Diagnosis not present

## 2020-03-09 DIAGNOSIS — I5033 Acute on chronic diastolic (congestive) heart failure: Secondary | ICD-10-CM | POA: Diagnosis not present

## 2020-03-11 DIAGNOSIS — Z7901 Long term (current) use of anticoagulants: Secondary | ICD-10-CM | POA: Diagnosis not present

## 2020-03-14 DIAGNOSIS — G4733 Obstructive sleep apnea (adult) (pediatric): Secondary | ICD-10-CM | POA: Diagnosis not present

## 2020-03-15 DIAGNOSIS — J029 Acute pharyngitis, unspecified: Secondary | ICD-10-CM | POA: Diagnosis not present

## 2020-03-15 DIAGNOSIS — R05 Cough: Secondary | ICD-10-CM | POA: Diagnosis not present

## 2020-03-15 DIAGNOSIS — H9203 Otalgia, bilateral: Secondary | ICD-10-CM | POA: Diagnosis not present

## 2020-03-18 DIAGNOSIS — Z7901 Long term (current) use of anticoagulants: Secondary | ICD-10-CM | POA: Diagnosis not present

## 2020-03-18 DIAGNOSIS — Z5181 Encounter for therapeutic drug level monitoring: Secondary | ICD-10-CM | POA: Diagnosis not present

## 2020-03-18 DIAGNOSIS — I10 Essential (primary) hypertension: Secondary | ICD-10-CM | POA: Diagnosis not present

## 2020-03-18 DIAGNOSIS — I4819 Other persistent atrial fibrillation: Secondary | ICD-10-CM | POA: Diagnosis not present

## 2020-03-18 DIAGNOSIS — R768 Other specified abnormal immunological findings in serum: Secondary | ICD-10-CM | POA: Diagnosis not present

## 2020-03-18 DIAGNOSIS — G4733 Obstructive sleep apnea (adult) (pediatric): Secondary | ICD-10-CM | POA: Diagnosis not present

## 2020-03-18 DIAGNOSIS — I272 Pulmonary hypertension, unspecified: Secondary | ICD-10-CM | POA: Diagnosis not present

## 2020-03-18 DIAGNOSIS — J9601 Acute respiratory failure with hypoxia: Secondary | ICD-10-CM | POA: Diagnosis not present

## 2020-03-18 DIAGNOSIS — U071 COVID-19: Secondary | ICD-10-CM | POA: Diagnosis not present

## 2020-03-18 DIAGNOSIS — R0600 Dyspnea, unspecified: Secondary | ICD-10-CM | POA: Diagnosis not present

## 2020-03-18 DIAGNOSIS — E1100 Type 2 diabetes mellitus with hyperosmolarity without nonketotic hyperglycemic-hyperosmolar coma (NKHHC): Secondary | ICD-10-CM | POA: Diagnosis not present

## 2020-03-19 DIAGNOSIS — M7751 Other enthesopathy of right foot: Secondary | ICD-10-CM | POA: Diagnosis not present

## 2020-03-25 DIAGNOSIS — Z7901 Long term (current) use of anticoagulants: Secondary | ICD-10-CM | POA: Diagnosis not present

## 2020-03-25 DIAGNOSIS — I4819 Other persistent atrial fibrillation: Secondary | ICD-10-CM | POA: Diagnosis not present

## 2020-04-02 DIAGNOSIS — Z79899 Other long term (current) drug therapy: Secondary | ICD-10-CM | POA: Diagnosis not present

## 2020-04-02 DIAGNOSIS — M1990 Unspecified osteoarthritis, unspecified site: Secondary | ICD-10-CM | POA: Diagnosis not present

## 2020-04-02 DIAGNOSIS — R768 Other specified abnormal immunological findings in serum: Secondary | ICD-10-CM | POA: Diagnosis not present

## 2020-04-09 DIAGNOSIS — I491 Atrial premature depolarization: Secondary | ICD-10-CM | POA: Diagnosis not present

## 2020-04-09 DIAGNOSIS — I440 Atrioventricular block, first degree: Secondary | ICD-10-CM | POA: Diagnosis not present

## 2020-04-09 DIAGNOSIS — I2723 Pulmonary hypertension due to lung diseases and hypoxia: Secondary | ICD-10-CM | POA: Diagnosis not present

## 2020-04-09 DIAGNOSIS — I213 ST elevation (STEMI) myocardial infarction of unspecified site: Secondary | ICD-10-CM | POA: Diagnosis not present

## 2020-04-09 DIAGNOSIS — J810 Acute pulmonary edema: Secondary | ICD-10-CM | POA: Diagnosis not present

## 2020-04-09 DIAGNOSIS — Z7984 Long term (current) use of oral hypoglycemic drugs: Secondary | ICD-10-CM | POA: Diagnosis not present

## 2020-04-09 DIAGNOSIS — J9601 Acute respiratory failure with hypoxia: Secondary | ICD-10-CM | POA: Diagnosis not present

## 2020-04-09 DIAGNOSIS — J90 Pleural effusion, not elsewhere classified: Secondary | ICD-10-CM | POA: Diagnosis not present

## 2020-04-09 DIAGNOSIS — I4891 Unspecified atrial fibrillation: Secondary | ICD-10-CM | POA: Diagnosis not present

## 2020-04-09 DIAGNOSIS — I4819 Other persistent atrial fibrillation: Secondary | ICD-10-CM | POA: Diagnosis not present

## 2020-04-09 DIAGNOSIS — I5033 Acute on chronic diastolic (congestive) heart failure: Secondary | ICD-10-CM | POA: Diagnosis not present

## 2020-04-09 DIAGNOSIS — E119 Type 2 diabetes mellitus without complications: Secondary | ICD-10-CM | POA: Diagnosis not present

## 2020-04-09 DIAGNOSIS — I252 Old myocardial infarction: Secondary | ICD-10-CM | POA: Diagnosis not present

## 2020-04-09 DIAGNOSIS — B948 Sequelae of other specified infectious and parasitic diseases: Secondary | ICD-10-CM | POA: Diagnosis not present

## 2020-04-09 DIAGNOSIS — I4519 Other right bundle-branch block: Secondary | ICD-10-CM | POA: Diagnosis not present

## 2020-04-09 DIAGNOSIS — G4733 Obstructive sleep apnea (adult) (pediatric): Secondary | ICD-10-CM | POA: Diagnosis not present

## 2020-04-09 DIAGNOSIS — I4892 Unspecified atrial flutter: Secondary | ICD-10-CM | POA: Diagnosis not present

## 2020-04-14 DIAGNOSIS — G4733 Obstructive sleep apnea (adult) (pediatric): Secondary | ICD-10-CM | POA: Diagnosis not present

## 2020-04-16 DIAGNOSIS — I272 Pulmonary hypertension, unspecified: Secondary | ICD-10-CM | POA: Diagnosis not present

## 2020-04-29 DIAGNOSIS — R945 Abnormal results of liver function studies: Secondary | ICD-10-CM | POA: Diagnosis not present

## 2020-05-03 DIAGNOSIS — K219 Gastro-esophageal reflux disease without esophagitis: Secondary | ICD-10-CM | POA: Diagnosis not present

## 2020-05-03 DIAGNOSIS — I8510 Secondary esophageal varices without bleeding: Secondary | ICD-10-CM | POA: Diagnosis not present

## 2020-05-03 DIAGNOSIS — I8500 Esophageal varices without bleeding: Secondary | ICD-10-CM | POA: Diagnosis not present

## 2020-05-03 DIAGNOSIS — Z86010 Personal history of colonic polyps: Secondary | ICD-10-CM | POA: Diagnosis not present

## 2020-05-03 DIAGNOSIS — K582 Mixed irritable bowel syndrome: Secondary | ICD-10-CM | POA: Diagnosis not present

## 2020-05-03 DIAGNOSIS — D128 Benign neoplasm of rectum: Secondary | ICD-10-CM | POA: Diagnosis not present

## 2020-05-03 DIAGNOSIS — K2950 Unspecified chronic gastritis without bleeding: Secondary | ICD-10-CM | POA: Diagnosis not present

## 2020-05-03 DIAGNOSIS — Z8719 Personal history of other diseases of the digestive system: Secondary | ICD-10-CM | POA: Diagnosis not present

## 2020-05-03 DIAGNOSIS — K7460 Unspecified cirrhosis of liver: Secondary | ICD-10-CM | POA: Diagnosis not present

## 2020-05-03 DIAGNOSIS — D124 Benign neoplasm of descending colon: Secondary | ICD-10-CM | POA: Diagnosis not present

## 2020-05-03 DIAGNOSIS — K7469 Other cirrhosis of liver: Secondary | ICD-10-CM | POA: Diagnosis not present

## 2020-05-03 DIAGNOSIS — K635 Polyp of colon: Secondary | ICD-10-CM | POA: Diagnosis not present

## 2020-05-03 DIAGNOSIS — R932 Abnormal findings on diagnostic imaging of liver and biliary tract: Secondary | ICD-10-CM | POA: Diagnosis not present

## 2020-05-03 DIAGNOSIS — K648 Other hemorrhoids: Secondary | ICD-10-CM | POA: Diagnosis not present

## 2020-05-03 DIAGNOSIS — R1013 Epigastric pain: Secondary | ICD-10-CM | POA: Diagnosis not present

## 2020-05-08 DIAGNOSIS — I272 Pulmonary hypertension, unspecified: Secondary | ICD-10-CM | POA: Diagnosis not present

## 2020-05-09 DIAGNOSIS — J9601 Acute respiratory failure with hypoxia: Secondary | ICD-10-CM | POA: Diagnosis not present

## 2020-05-09 DIAGNOSIS — J90 Pleural effusion, not elsewhere classified: Secondary | ICD-10-CM | POA: Diagnosis not present

## 2020-05-09 DIAGNOSIS — J810 Acute pulmonary edema: Secondary | ICD-10-CM | POA: Diagnosis not present

## 2020-05-09 DIAGNOSIS — I5033 Acute on chronic diastolic (congestive) heart failure: Secondary | ICD-10-CM | POA: Diagnosis not present

## 2020-05-14 DIAGNOSIS — G4733 Obstructive sleep apnea (adult) (pediatric): Secondary | ICD-10-CM | POA: Diagnosis not present

## 2020-05-17 DIAGNOSIS — Z7901 Long term (current) use of anticoagulants: Secondary | ICD-10-CM | POA: Diagnosis not present

## 2020-05-17 DIAGNOSIS — I4819 Other persistent atrial fibrillation: Secondary | ICD-10-CM | POA: Diagnosis not present

## 2020-05-17 DIAGNOSIS — G4733 Obstructive sleep apnea (adult) (pediatric): Secondary | ICD-10-CM | POA: Diagnosis not present

## 2020-05-17 DIAGNOSIS — J984 Other disorders of lung: Secondary | ICD-10-CM | POA: Insufficient documentation

## 2020-05-17 DIAGNOSIS — I10 Essential (primary) hypertension: Secondary | ICD-10-CM | POA: Diagnosis not present

## 2020-05-22 DIAGNOSIS — G4733 Obstructive sleep apnea (adult) (pediatric): Secondary | ICD-10-CM | POA: Diagnosis not present

## 2020-05-25 DIAGNOSIS — Z20822 Contact with and (suspected) exposure to covid-19: Secondary | ICD-10-CM | POA: Diagnosis not present

## 2020-05-25 DIAGNOSIS — R5383 Other fatigue: Secondary | ICD-10-CM | POA: Diagnosis not present

## 2020-05-25 DIAGNOSIS — J40 Bronchitis, not specified as acute or chronic: Secondary | ICD-10-CM | POA: Diagnosis not present

## 2020-05-25 DIAGNOSIS — R509 Fever, unspecified: Secondary | ICD-10-CM | POA: Diagnosis not present

## 2020-05-25 DIAGNOSIS — R062 Wheezing: Secondary | ICD-10-CM | POA: Diagnosis not present

## 2020-05-25 DIAGNOSIS — R519 Headache, unspecified: Secondary | ICD-10-CM | POA: Diagnosis not present

## 2020-05-26 DIAGNOSIS — I4891 Unspecified atrial fibrillation: Secondary | ICD-10-CM | POA: Diagnosis not present

## 2020-05-26 DIAGNOSIS — K5900 Constipation, unspecified: Secondary | ICD-10-CM | POA: Diagnosis not present

## 2020-05-26 DIAGNOSIS — J1282 Pneumonia due to coronavirus disease 2019: Secondary | ICD-10-CM | POA: Diagnosis not present

## 2020-05-26 DIAGNOSIS — G4733 Obstructive sleep apnea (adult) (pediatric): Secondary | ICD-10-CM | POA: Diagnosis not present

## 2020-05-26 DIAGNOSIS — D649 Anemia, unspecified: Secondary | ICD-10-CM | POA: Diagnosis not present

## 2020-05-26 DIAGNOSIS — Z7722 Contact with and (suspected) exposure to environmental tobacco smoke (acute) (chronic): Secondary | ICD-10-CM | POA: Diagnosis not present

## 2020-05-26 DIAGNOSIS — Z7901 Long term (current) use of anticoagulants: Secondary | ICD-10-CM | POA: Diagnosis not present

## 2020-05-26 DIAGNOSIS — D696 Thrombocytopenia, unspecified: Secondary | ICD-10-CM | POA: Diagnosis not present

## 2020-05-26 DIAGNOSIS — I10 Essential (primary) hypertension: Secondary | ICD-10-CM | POA: Diagnosis not present

## 2020-05-26 DIAGNOSIS — Z9981 Dependence on supplemental oxygen: Secondary | ICD-10-CM | POA: Diagnosis not present

## 2020-05-26 DIAGNOSIS — Z20822 Contact with and (suspected) exposure to covid-19: Secondary | ICD-10-CM | POA: Diagnosis not present

## 2020-05-26 DIAGNOSIS — Z8616 Personal history of COVID-19: Secondary | ICD-10-CM | POA: Diagnosis not present

## 2020-05-26 DIAGNOSIS — I5032 Chronic diastolic (congestive) heart failure: Secondary | ICD-10-CM | POA: Diagnosis not present

## 2020-05-26 DIAGNOSIS — I272 Pulmonary hypertension, unspecified: Secondary | ICD-10-CM | POA: Diagnosis not present

## 2020-05-26 DIAGNOSIS — R0602 Shortness of breath: Secondary | ICD-10-CM | POA: Diagnosis not present

## 2020-05-26 DIAGNOSIS — Z90710 Acquired absence of both cervix and uterus: Secondary | ICD-10-CM | POA: Diagnosis not present

## 2020-05-26 DIAGNOSIS — I700 Atherosclerosis of aorta: Secondary | ICD-10-CM | POA: Diagnosis not present

## 2020-05-26 DIAGNOSIS — I2510 Atherosclerotic heart disease of native coronary artery without angina pectoris: Secondary | ICD-10-CM | POA: Diagnosis not present

## 2020-05-26 DIAGNOSIS — E8770 Fluid overload, unspecified: Secondary | ICD-10-CM | POA: Diagnosis not present

## 2020-05-26 DIAGNOSIS — A419 Sepsis, unspecified organism: Secondary | ICD-10-CM | POA: Diagnosis not present

## 2020-05-26 DIAGNOSIS — Z7984 Long term (current) use of oral hypoglycemic drugs: Secondary | ICD-10-CM | POA: Diagnosis not present

## 2020-05-26 DIAGNOSIS — M47814 Spondylosis without myelopathy or radiculopathy, thoracic region: Secondary | ICD-10-CM | POA: Diagnosis not present

## 2020-05-26 DIAGNOSIS — Z79899 Other long term (current) drug therapy: Secondary | ICD-10-CM | POA: Diagnosis not present

## 2020-05-26 DIAGNOSIS — E119 Type 2 diabetes mellitus without complications: Secondary | ICD-10-CM | POA: Diagnosis not present

## 2020-05-26 DIAGNOSIS — R0902 Hypoxemia: Secondary | ICD-10-CM | POA: Diagnosis not present

## 2020-05-26 DIAGNOSIS — J811 Chronic pulmonary edema: Secondary | ICD-10-CM | POA: Diagnosis not present

## 2020-05-26 DIAGNOSIS — E871 Hypo-osmolality and hyponatremia: Secondary | ICD-10-CM | POA: Diagnosis not present

## 2020-05-26 DIAGNOSIS — I110 Hypertensive heart disease with heart failure: Secondary | ICD-10-CM | POA: Diagnosis not present

## 2020-05-26 DIAGNOSIS — I517 Cardiomegaly: Secondary | ICD-10-CM | POA: Diagnosis not present

## 2020-05-26 DIAGNOSIS — R17 Unspecified jaundice: Secondary | ICD-10-CM | POA: Diagnosis not present

## 2020-05-26 DIAGNOSIS — J9621 Acute and chronic respiratory failure with hypoxia: Secondary | ICD-10-CM | POA: Diagnosis not present

## 2020-05-26 DIAGNOSIS — R069 Unspecified abnormalities of breathing: Secondary | ICD-10-CM | POA: Diagnosis not present

## 2020-05-26 DIAGNOSIS — J984 Other disorders of lung: Secondary | ICD-10-CM | POA: Diagnosis not present

## 2020-05-26 DIAGNOSIS — I509 Heart failure, unspecified: Secondary | ICD-10-CM | POA: Diagnosis not present

## 2020-05-26 DIAGNOSIS — R509 Fever, unspecified: Secondary | ICD-10-CM | POA: Diagnosis not present

## 2020-05-26 DIAGNOSIS — J189 Pneumonia, unspecified organism: Secondary | ICD-10-CM | POA: Diagnosis not present

## 2020-05-26 DIAGNOSIS — I4819 Other persistent atrial fibrillation: Secondary | ICD-10-CM | POA: Diagnosis not present

## 2020-05-26 DIAGNOSIS — Z7982 Long term (current) use of aspirin: Secondary | ICD-10-CM | POA: Diagnosis not present

## 2020-05-26 DIAGNOSIS — B948 Sequelae of other specified infectious and parasitic diseases: Secondary | ICD-10-CM | POA: Diagnosis not present

## 2020-05-26 DIAGNOSIS — R062 Wheezing: Secondary | ICD-10-CM | POA: Diagnosis not present

## 2020-05-26 DIAGNOSIS — Z7951 Long term (current) use of inhaled steroids: Secondary | ICD-10-CM | POA: Diagnosis not present

## 2020-05-26 DIAGNOSIS — I4510 Unspecified right bundle-branch block: Secondary | ICD-10-CM | POA: Diagnosis not present

## 2020-05-26 DIAGNOSIS — Z9989 Dependence on other enabling machines and devices: Secondary | ICD-10-CM | POA: Diagnosis not present

## 2020-05-26 DIAGNOSIS — R6520 Severe sepsis without septic shock: Secondary | ICD-10-CM | POA: Diagnosis not present

## 2020-05-26 DIAGNOSIS — I5033 Acute on chronic diastolic (congestive) heart failure: Secondary | ICD-10-CM | POA: Diagnosis not present

## 2020-05-26 DIAGNOSIS — J9601 Acute respiratory failure with hypoxia: Secondary | ICD-10-CM | POA: Diagnosis not present

## 2020-06-04 DIAGNOSIS — Z7901 Long term (current) use of anticoagulants: Secondary | ICD-10-CM | POA: Diagnosis not present

## 2020-06-09 DIAGNOSIS — J810 Acute pulmonary edema: Secondary | ICD-10-CM | POA: Diagnosis not present

## 2020-06-09 DIAGNOSIS — J90 Pleural effusion, not elsewhere classified: Secondary | ICD-10-CM | POA: Diagnosis not present

## 2020-06-09 DIAGNOSIS — I5033 Acute on chronic diastolic (congestive) heart failure: Secondary | ICD-10-CM | POA: Diagnosis not present

## 2020-06-09 DIAGNOSIS — J9601 Acute respiratory failure with hypoxia: Secondary | ICD-10-CM | POA: Diagnosis not present

## 2020-06-10 DIAGNOSIS — R0602 Shortness of breath: Secondary | ICD-10-CM | POA: Diagnosis not present

## 2020-06-10 DIAGNOSIS — R7989 Other specified abnormal findings of blood chemistry: Secondary | ICD-10-CM | POA: Diagnosis not present

## 2020-06-10 DIAGNOSIS — E2839 Other primary ovarian failure: Secondary | ICD-10-CM | POA: Diagnosis not present

## 2020-06-10 DIAGNOSIS — Z09 Encounter for follow-up examination after completed treatment for conditions other than malignant neoplasm: Secondary | ICD-10-CM | POA: Diagnosis not present

## 2020-06-10 DIAGNOSIS — E871 Hypo-osmolality and hyponatremia: Secondary | ICD-10-CM | POA: Diagnosis not present

## 2020-06-10 DIAGNOSIS — I4891 Unspecified atrial fibrillation: Secondary | ICD-10-CM | POA: Diagnosis not present

## 2020-06-14 DIAGNOSIS — G4733 Obstructive sleep apnea (adult) (pediatric): Secondary | ICD-10-CM | POA: Diagnosis not present

## 2020-06-17 DIAGNOSIS — I4819 Other persistent atrial fibrillation: Secondary | ICD-10-CM | POA: Diagnosis not present

## 2020-06-17 DIAGNOSIS — Z7901 Long term (current) use of anticoagulants: Secondary | ICD-10-CM | POA: Diagnosis not present

## 2020-06-19 DIAGNOSIS — Z9049 Acquired absence of other specified parts of digestive tract: Secondary | ICD-10-CM | POA: Diagnosis not present

## 2020-06-19 DIAGNOSIS — R9389 Abnormal findings on diagnostic imaging of other specified body structures: Secondary | ICD-10-CM | POA: Diagnosis not present

## 2020-06-19 DIAGNOSIS — R945 Abnormal results of liver function studies: Secondary | ICD-10-CM | POA: Diagnosis not present

## 2020-06-24 DIAGNOSIS — I4819 Other persistent atrial fibrillation: Secondary | ICD-10-CM | POA: Diagnosis not present

## 2020-06-24 DIAGNOSIS — I10 Essential (primary) hypertension: Secondary | ICD-10-CM | POA: Diagnosis not present

## 2020-06-29 DIAGNOSIS — Z23 Encounter for immunization: Secondary | ICD-10-CM | POA: Diagnosis not present

## 2020-06-29 DIAGNOSIS — E119 Type 2 diabetes mellitus without complications: Secondary | ICD-10-CM | POA: Diagnosis not present

## 2020-06-29 DIAGNOSIS — I10 Essential (primary) hypertension: Secondary | ICD-10-CM | POA: Diagnosis not present

## 2020-06-29 DIAGNOSIS — S61213A Laceration without foreign body of left middle finger without damage to nail, initial encounter: Secondary | ICD-10-CM | POA: Diagnosis not present

## 2020-07-01 DIAGNOSIS — Z7901 Long term (current) use of anticoagulants: Secondary | ICD-10-CM | POA: Diagnosis not present

## 2020-07-02 DIAGNOSIS — I8510 Secondary esophageal varices without bleeding: Secondary | ICD-10-CM | POA: Diagnosis not present

## 2020-07-02 DIAGNOSIS — M1712 Unilateral primary osteoarthritis, left knee: Secondary | ICD-10-CM | POA: Diagnosis not present

## 2020-07-02 DIAGNOSIS — K7460 Unspecified cirrhosis of liver: Secondary | ICD-10-CM | POA: Diagnosis not present

## 2020-07-02 DIAGNOSIS — R188 Other ascites: Secondary | ICD-10-CM | POA: Diagnosis not present

## 2020-07-02 DIAGNOSIS — M1711 Unilateral primary osteoarthritis, right knee: Secondary | ICD-10-CM | POA: Diagnosis not present

## 2020-07-09 DIAGNOSIS — J9601 Acute respiratory failure with hypoxia: Secondary | ICD-10-CM | POA: Diagnosis not present

## 2020-07-09 DIAGNOSIS — R768 Other specified abnormal immunological findings in serum: Secondary | ICD-10-CM | POA: Diagnosis not present

## 2020-07-09 DIAGNOSIS — Z79899 Other long term (current) drug therapy: Secondary | ICD-10-CM | POA: Diagnosis not present

## 2020-07-10 DIAGNOSIS — J9601 Acute respiratory failure with hypoxia: Secondary | ICD-10-CM | POA: Diagnosis not present

## 2020-07-10 DIAGNOSIS — J810 Acute pulmonary edema: Secondary | ICD-10-CM | POA: Diagnosis not present

## 2020-07-10 DIAGNOSIS — I5033 Acute on chronic diastolic (congestive) heart failure: Secondary | ICD-10-CM | POA: Diagnosis not present

## 2020-07-10 DIAGNOSIS — J90 Pleural effusion, not elsewhere classified: Secondary | ICD-10-CM | POA: Diagnosis not present

## 2020-07-15 DIAGNOSIS — G4733 Obstructive sleep apnea (adult) (pediatric): Secondary | ICD-10-CM | POA: Diagnosis not present

## 2020-07-18 DIAGNOSIS — M1990 Unspecified osteoarthritis, unspecified site: Secondary | ICD-10-CM | POA: Diagnosis not present

## 2020-07-18 DIAGNOSIS — R768 Other specified abnormal immunological findings in serum: Secondary | ICD-10-CM | POA: Diagnosis not present

## 2020-07-18 DIAGNOSIS — Z79899 Other long term (current) drug therapy: Secondary | ICD-10-CM | POA: Diagnosis not present

## 2020-07-22 DIAGNOSIS — E113291 Type 2 diabetes mellitus with mild nonproliferative diabetic retinopathy without macular edema, right eye: Secondary | ICD-10-CM | POA: Diagnosis not present

## 2020-07-23 DIAGNOSIS — I10 Essential (primary) hypertension: Secondary | ICD-10-CM | POA: Diagnosis not present

## 2020-07-23 DIAGNOSIS — E1165 Type 2 diabetes mellitus with hyperglycemia: Secondary | ICD-10-CM | POA: Diagnosis not present

## 2020-07-23 DIAGNOSIS — R0789 Other chest pain: Secondary | ICD-10-CM | POA: Diagnosis not present

## 2020-07-23 DIAGNOSIS — R1013 Epigastric pain: Secondary | ICD-10-CM | POA: Diagnosis not present

## 2020-07-23 DIAGNOSIS — Z23 Encounter for immunization: Secondary | ICD-10-CM | POA: Diagnosis not present

## 2020-07-23 DIAGNOSIS — M546 Pain in thoracic spine: Secondary | ICD-10-CM | POA: Diagnosis not present

## 2020-07-24 DIAGNOSIS — R079 Chest pain, unspecified: Secondary | ICD-10-CM | POA: Diagnosis not present

## 2020-07-24 DIAGNOSIS — M546 Pain in thoracic spine: Secondary | ICD-10-CM | POA: Diagnosis not present

## 2020-07-24 DIAGNOSIS — J90 Pleural effusion, not elsewhere classified: Secondary | ICD-10-CM | POA: Diagnosis not present

## 2020-07-24 DIAGNOSIS — R0789 Other chest pain: Secondary | ICD-10-CM | POA: Diagnosis not present

## 2020-07-24 DIAGNOSIS — I517 Cardiomegaly: Secondary | ICD-10-CM | POA: Diagnosis not present

## 2020-07-30 DIAGNOSIS — Z7901 Long term (current) use of anticoagulants: Secondary | ICD-10-CM | POA: Diagnosis not present

## 2020-08-02 DIAGNOSIS — R0789 Other chest pain: Secondary | ICD-10-CM | POA: Diagnosis not present

## 2020-08-02 DIAGNOSIS — N3001 Acute cystitis with hematuria: Secondary | ICD-10-CM | POA: Diagnosis not present

## 2020-08-02 DIAGNOSIS — R109 Unspecified abdominal pain: Secondary | ICD-10-CM | POA: Diagnosis not present

## 2020-08-05 DIAGNOSIS — I272 Pulmonary hypertension, unspecified: Secondary | ICD-10-CM | POA: Diagnosis not present

## 2020-08-05 DIAGNOSIS — J9601 Acute respiratory failure with hypoxia: Secondary | ICD-10-CM | POA: Diagnosis not present

## 2020-08-07 DIAGNOSIS — M545 Low back pain, unspecified: Secondary | ICD-10-CM | POA: Diagnosis not present

## 2020-08-07 DIAGNOSIS — G8929 Other chronic pain: Secondary | ICD-10-CM | POA: Diagnosis not present

## 2020-08-09 DIAGNOSIS — J810 Acute pulmonary edema: Secondary | ICD-10-CM | POA: Diagnosis not present

## 2020-08-09 DIAGNOSIS — J90 Pleural effusion, not elsewhere classified: Secondary | ICD-10-CM | POA: Diagnosis not present

## 2020-08-09 DIAGNOSIS — J9601 Acute respiratory failure with hypoxia: Secondary | ICD-10-CM | POA: Diagnosis not present

## 2020-08-09 DIAGNOSIS — I5033 Acute on chronic diastolic (congestive) heart failure: Secondary | ICD-10-CM | POA: Diagnosis not present

## 2020-08-14 DIAGNOSIS — E1165 Type 2 diabetes mellitus with hyperglycemia: Secondary | ICD-10-CM | POA: Diagnosis not present

## 2020-08-14 DIAGNOSIS — G4733 Obstructive sleep apnea (adult) (pediatric): Secondary | ICD-10-CM | POA: Diagnosis not present

## 2020-08-15 DIAGNOSIS — J984 Other disorders of lung: Secondary | ICD-10-CM | POA: Diagnosis not present

## 2020-08-15 DIAGNOSIS — M25552 Pain in left hip: Secondary | ICD-10-CM | POA: Diagnosis not present

## 2020-08-15 DIAGNOSIS — M069 Rheumatoid arthritis, unspecified: Secondary | ICD-10-CM | POA: Diagnosis not present

## 2020-08-15 DIAGNOSIS — I2721 Secondary pulmonary arterial hypertension: Secondary | ICD-10-CM | POA: Diagnosis not present

## 2020-08-15 DIAGNOSIS — R768 Other specified abnormal immunological findings in serum: Secondary | ICD-10-CM | POA: Diagnosis not present

## 2020-08-15 DIAGNOSIS — Z8616 Personal history of COVID-19: Secondary | ICD-10-CM | POA: Diagnosis not present

## 2020-08-15 DIAGNOSIS — I272 Pulmonary hypertension, unspecified: Secondary | ICD-10-CM | POA: Diagnosis not present

## 2020-08-15 DIAGNOSIS — R0602 Shortness of breath: Secondary | ICD-10-CM | POA: Diagnosis not present

## 2020-08-15 DIAGNOSIS — G4733 Obstructive sleep apnea (adult) (pediatric): Secondary | ICD-10-CM | POA: Diagnosis not present

## 2020-08-15 DIAGNOSIS — Z79899 Other long term (current) drug therapy: Secondary | ICD-10-CM | POA: Diagnosis not present

## 2020-08-15 DIAGNOSIS — I4819 Other persistent atrial fibrillation: Secondary | ICD-10-CM | POA: Diagnosis not present

## 2020-08-22 DIAGNOSIS — L859 Epidermal thickening, unspecified: Secondary | ICD-10-CM | POA: Diagnosis not present

## 2020-08-22 DIAGNOSIS — M79671 Pain in right foot: Secondary | ICD-10-CM | POA: Diagnosis not present

## 2020-08-22 DIAGNOSIS — B351 Tinea unguium: Secondary | ICD-10-CM | POA: Diagnosis not present

## 2020-08-22 DIAGNOSIS — M2041 Other hammer toe(s) (acquired), right foot: Secondary | ICD-10-CM | POA: Diagnosis not present

## 2020-08-22 DIAGNOSIS — E119 Type 2 diabetes mellitus without complications: Secondary | ICD-10-CM | POA: Diagnosis not present

## 2020-08-22 DIAGNOSIS — M79675 Pain in left toe(s): Secondary | ICD-10-CM | POA: Diagnosis not present

## 2020-08-22 DIAGNOSIS — M79674 Pain in right toe(s): Secondary | ICD-10-CM | POA: Diagnosis not present

## 2020-08-27 DIAGNOSIS — J0100 Acute maxillary sinusitis, unspecified: Secondary | ICD-10-CM | POA: Diagnosis not present

## 2020-08-27 DIAGNOSIS — H7292 Unspecified perforation of tympanic membrane, left ear: Secondary | ICD-10-CM | POA: Diagnosis not present

## 2020-08-27 DIAGNOSIS — R300 Dysuria: Secondary | ICD-10-CM | POA: Diagnosis not present

## 2020-08-28 DIAGNOSIS — Z7901 Long term (current) use of anticoagulants: Secondary | ICD-10-CM | POA: Diagnosis not present

## 2020-08-28 DIAGNOSIS — I4819 Other persistent atrial fibrillation: Secondary | ICD-10-CM | POA: Diagnosis not present

## 2020-08-28 DIAGNOSIS — R918 Other nonspecific abnormal finding of lung field: Secondary | ICD-10-CM | POA: Diagnosis not present

## 2020-08-28 DIAGNOSIS — I10 Essential (primary) hypertension: Secondary | ICD-10-CM | POA: Diagnosis not present

## 2020-08-28 DIAGNOSIS — J984 Other disorders of lung: Secondary | ICD-10-CM | POA: Diagnosis not present

## 2020-08-28 DIAGNOSIS — G4733 Obstructive sleep apnea (adult) (pediatric): Secondary | ICD-10-CM | POA: Diagnosis not present

## 2020-08-28 DIAGNOSIS — I272 Pulmonary hypertension, unspecified: Secondary | ICD-10-CM | POA: Diagnosis not present

## 2020-08-29 DIAGNOSIS — H9222 Otorrhagia, left ear: Secondary | ICD-10-CM | POA: Diagnosis not present

## 2020-09-02 DIAGNOSIS — I272 Pulmonary hypertension, unspecified: Secondary | ICD-10-CM | POA: Diagnosis not present

## 2020-09-02 DIAGNOSIS — J9601 Acute respiratory failure with hypoxia: Secondary | ICD-10-CM | POA: Diagnosis not present

## 2020-09-09 DIAGNOSIS — I5033 Acute on chronic diastolic (congestive) heart failure: Secondary | ICD-10-CM | POA: Diagnosis not present

## 2020-09-09 DIAGNOSIS — J810 Acute pulmonary edema: Secondary | ICD-10-CM | POA: Diagnosis not present

## 2020-09-09 DIAGNOSIS — J90 Pleural effusion, not elsewhere classified: Secondary | ICD-10-CM | POA: Diagnosis not present

## 2020-09-09 DIAGNOSIS — Z7901 Long term (current) use of anticoagulants: Secondary | ICD-10-CM | POA: Diagnosis not present

## 2020-09-09 DIAGNOSIS — J9601 Acute respiratory failure with hypoxia: Secondary | ICD-10-CM | POA: Diagnosis not present

## 2020-09-10 DIAGNOSIS — M7062 Trochanteric bursitis, left hip: Secondary | ICD-10-CM | POA: Diagnosis not present

## 2020-09-14 DIAGNOSIS — G4733 Obstructive sleep apnea (adult) (pediatric): Secondary | ICD-10-CM | POA: Diagnosis not present

## 2020-09-15 DIAGNOSIS — G4733 Obstructive sleep apnea (adult) (pediatric): Secondary | ICD-10-CM | POA: Diagnosis not present

## 2020-09-16 DIAGNOSIS — G4733 Obstructive sleep apnea (adult) (pediatric): Secondary | ICD-10-CM | POA: Diagnosis not present

## 2020-09-17 DIAGNOSIS — I4819 Other persistent atrial fibrillation: Secondary | ICD-10-CM | POA: Diagnosis not present

## 2020-09-17 DIAGNOSIS — R0602 Shortness of breath: Secondary | ICD-10-CM | POA: Diagnosis not present

## 2020-09-18 DIAGNOSIS — I4891 Unspecified atrial fibrillation: Secondary | ICD-10-CM | POA: Diagnosis not present

## 2020-09-23 DIAGNOSIS — R0602 Shortness of breath: Secondary | ICD-10-CM | POA: Diagnosis not present

## 2020-09-24 DIAGNOSIS — G4733 Obstructive sleep apnea (adult) (pediatric): Secondary | ICD-10-CM | POA: Diagnosis not present

## 2020-09-25 DIAGNOSIS — G4733 Obstructive sleep apnea (adult) (pediatric): Secondary | ICD-10-CM | POA: Diagnosis not present

## 2020-09-25 DIAGNOSIS — I4819 Other persistent atrial fibrillation: Secondary | ICD-10-CM | POA: Diagnosis not present

## 2020-09-25 DIAGNOSIS — I10 Essential (primary) hypertension: Secondary | ICD-10-CM | POA: Diagnosis not present

## 2020-09-25 DIAGNOSIS — E119 Type 2 diabetes mellitus without complications: Secondary | ICD-10-CM | POA: Diagnosis not present

## 2020-09-25 DIAGNOSIS — I272 Pulmonary hypertension, unspecified: Secondary | ICD-10-CM | POA: Diagnosis not present

## 2020-09-25 DIAGNOSIS — Z8616 Personal history of COVID-19: Secondary | ICD-10-CM | POA: Diagnosis not present

## 2020-10-02 DIAGNOSIS — I272 Pulmonary hypertension, unspecified: Secondary | ICD-10-CM | POA: Diagnosis not present

## 2020-10-02 DIAGNOSIS — J9601 Acute respiratory failure with hypoxia: Secondary | ICD-10-CM | POA: Diagnosis not present

## 2020-10-03 DIAGNOSIS — I4819 Other persistent atrial fibrillation: Secondary | ICD-10-CM | POA: Diagnosis not present

## 2020-10-07 DIAGNOSIS — M545 Low back pain, unspecified: Secondary | ICD-10-CM | POA: Diagnosis not present

## 2020-10-07 DIAGNOSIS — J00 Acute nasopharyngitis [common cold]: Secondary | ICD-10-CM | POA: Diagnosis not present

## 2020-10-07 DIAGNOSIS — T148XXA Other injury of unspecified body region, initial encounter: Secondary | ICD-10-CM | POA: Diagnosis not present

## 2020-10-07 DIAGNOSIS — G8929 Other chronic pain: Secondary | ICD-10-CM | POA: Diagnosis not present

## 2020-10-07 DIAGNOSIS — H0011 Chalazion right upper eyelid: Secondary | ICD-10-CM | POA: Diagnosis not present

## 2020-10-08 DIAGNOSIS — I10 Essential (primary) hypertension: Secondary | ICD-10-CM | POA: Diagnosis not present

## 2020-10-08 DIAGNOSIS — Z13220 Encounter for screening for lipoid disorders: Secondary | ICD-10-CM | POA: Diagnosis not present

## 2020-10-08 DIAGNOSIS — Z7984 Long term (current) use of oral hypoglycemic drugs: Secondary | ICD-10-CM | POA: Diagnosis not present

## 2020-10-08 DIAGNOSIS — E1100 Type 2 diabetes mellitus with hyperosmolarity without nonketotic hyperglycemic-hyperosmolar coma (NKHHC): Secondary | ICD-10-CM | POA: Diagnosis not present

## 2020-10-08 DIAGNOSIS — H00011 Hordeolum externum right upper eyelid: Secondary | ICD-10-CM | POA: Diagnosis not present

## 2020-10-08 DIAGNOSIS — E559 Vitamin D deficiency, unspecified: Secondary | ICD-10-CM | POA: Diagnosis not present

## 2020-10-08 DIAGNOSIS — Z1329 Encounter for screening for other suspected endocrine disorder: Secondary | ICD-10-CM | POA: Diagnosis not present

## 2020-10-09 DIAGNOSIS — J810 Acute pulmonary edema: Secondary | ICD-10-CM | POA: Diagnosis not present

## 2020-10-09 DIAGNOSIS — I5033 Acute on chronic diastolic (congestive) heart failure: Secondary | ICD-10-CM | POA: Diagnosis not present

## 2020-10-09 DIAGNOSIS — J90 Pleural effusion, not elsewhere classified: Secondary | ICD-10-CM | POA: Diagnosis not present

## 2020-10-09 DIAGNOSIS — J9601 Acute respiratory failure with hypoxia: Secondary | ICD-10-CM | POA: Diagnosis not present

## 2020-10-10 DIAGNOSIS — E559 Vitamin D deficiency, unspecified: Secondary | ICD-10-CM | POA: Diagnosis not present

## 2020-10-10 DIAGNOSIS — E871 Hypo-osmolality and hyponatremia: Secondary | ICD-10-CM | POA: Diagnosis not present

## 2020-10-10 DIAGNOSIS — M545 Low back pain, unspecified: Secondary | ICD-10-CM | POA: Diagnosis not present

## 2020-10-10 DIAGNOSIS — E1100 Type 2 diabetes mellitus with hyperosmolarity without nonketotic hyperglycemic-hyperosmolar coma (NKHHC): Secondary | ICD-10-CM | POA: Diagnosis not present

## 2020-10-10 DIAGNOSIS — D696 Thrombocytopenia, unspecified: Secondary | ICD-10-CM | POA: Diagnosis not present

## 2020-10-10 DIAGNOSIS — I4819 Other persistent atrial fibrillation: Secondary | ICD-10-CM | POA: Diagnosis not present

## 2020-10-10 DIAGNOSIS — G4700 Insomnia, unspecified: Secondary | ICD-10-CM | POA: Diagnosis not present

## 2020-10-10 DIAGNOSIS — G8929 Other chronic pain: Secondary | ICD-10-CM | POA: Diagnosis not present

## 2020-10-14 DIAGNOSIS — H18513 Endothelial corneal dystrophy, bilateral: Secondary | ICD-10-CM | POA: Diagnosis not present

## 2020-10-14 DIAGNOSIS — H00011 Hordeolum externum right upper eyelid: Secondary | ICD-10-CM | POA: Diagnosis not present

## 2020-10-14 DIAGNOSIS — E119 Type 2 diabetes mellitus without complications: Secondary | ICD-10-CM | POA: Diagnosis not present

## 2020-10-14 DIAGNOSIS — G4733 Obstructive sleep apnea (adult) (pediatric): Secondary | ICD-10-CM | POA: Diagnosis not present

## 2020-10-14 DIAGNOSIS — H2513 Age-related nuclear cataract, bilateral: Secondary | ICD-10-CM | POA: Diagnosis not present

## 2020-10-15 DIAGNOSIS — Z6832 Body mass index (BMI) 32.0-32.9, adult: Secondary | ICD-10-CM | POA: Diagnosis not present

## 2020-10-15 DIAGNOSIS — M545 Low back pain, unspecified: Secondary | ICD-10-CM | POA: Diagnosis not present

## 2020-10-15 DIAGNOSIS — M1711 Unilateral primary osteoarthritis, right knee: Secondary | ICD-10-CM | POA: Diagnosis not present

## 2020-10-15 DIAGNOSIS — L03119 Cellulitis of unspecified part of limb: Secondary | ICD-10-CM | POA: Diagnosis not present

## 2020-10-15 DIAGNOSIS — L02619 Cutaneous abscess of unspecified foot: Secondary | ICD-10-CM | POA: Diagnosis not present

## 2020-10-15 DIAGNOSIS — M1611 Unilateral primary osteoarthritis, right hip: Secondary | ICD-10-CM | POA: Diagnosis not present

## 2020-10-15 DIAGNOSIS — L98491 Non-pressure chronic ulcer of skin of other sites limited to breakdown of skin: Secondary | ICD-10-CM | POA: Diagnosis not present

## 2020-10-23 DIAGNOSIS — I4821 Permanent atrial fibrillation: Secondary | ICD-10-CM | POA: Diagnosis not present

## 2020-10-23 DIAGNOSIS — Z833 Family history of diabetes mellitus: Secondary | ICD-10-CM | POA: Diagnosis not present

## 2020-10-23 DIAGNOSIS — Z7984 Long term (current) use of oral hypoglycemic drugs: Secondary | ICD-10-CM | POA: Diagnosis not present

## 2020-10-23 DIAGNOSIS — J9601 Acute respiratory failure with hypoxia: Secondary | ICD-10-CM | POA: Diagnosis not present

## 2020-10-23 DIAGNOSIS — I4891 Unspecified atrial fibrillation: Secondary | ICD-10-CM | POA: Diagnosis not present

## 2020-10-23 DIAGNOSIS — J123 Human metapneumovirus pneumonia: Secondary | ICD-10-CM | POA: Diagnosis not present

## 2020-10-23 DIAGNOSIS — M069 Rheumatoid arthritis, unspecified: Secondary | ICD-10-CM | POA: Diagnosis not present

## 2020-10-23 DIAGNOSIS — G4733 Obstructive sleep apnea (adult) (pediatric): Secondary | ICD-10-CM | POA: Diagnosis not present

## 2020-10-23 DIAGNOSIS — R0602 Shortness of breath: Secondary | ICD-10-CM | POA: Diagnosis not present

## 2020-10-23 DIAGNOSIS — Z9989 Dependence on other enabling machines and devices: Secondary | ICD-10-CM | POA: Diagnosis not present

## 2020-10-23 DIAGNOSIS — J440 Chronic obstructive pulmonary disease with acute lower respiratory infection: Secondary | ICD-10-CM | POA: Diagnosis not present

## 2020-10-23 DIAGNOSIS — Z8249 Family history of ischemic heart disease and other diseases of the circulatory system: Secondary | ICD-10-CM | POA: Diagnosis not present

## 2020-10-23 DIAGNOSIS — Z9111 Patient's noncompliance with dietary regimen: Secondary | ICD-10-CM | POA: Diagnosis not present

## 2020-10-23 DIAGNOSIS — Z20822 Contact with and (suspected) exposure to covid-19: Secondary | ICD-10-CM | POA: Diagnosis not present

## 2020-10-23 DIAGNOSIS — E119 Type 2 diabetes mellitus without complications: Secondary | ICD-10-CM | POA: Diagnosis not present

## 2020-10-23 DIAGNOSIS — Z7982 Long term (current) use of aspirin: Secondary | ICD-10-CM | POA: Diagnosis not present

## 2020-10-23 DIAGNOSIS — I21A1 Myocardial infarction type 2: Secondary | ICD-10-CM | POA: Diagnosis not present

## 2020-10-23 DIAGNOSIS — I10 Essential (primary) hypertension: Secondary | ICD-10-CM | POA: Diagnosis not present

## 2020-10-23 DIAGNOSIS — I5043 Acute on chronic combined systolic (congestive) and diastolic (congestive) heart failure: Secondary | ICD-10-CM | POA: Diagnosis not present

## 2020-10-23 DIAGNOSIS — E8770 Fluid overload, unspecified: Secondary | ICD-10-CM | POA: Diagnosis not present

## 2020-10-23 DIAGNOSIS — Z90710 Acquired absence of both cervix and uterus: Secondary | ICD-10-CM | POA: Diagnosis not present

## 2020-10-23 DIAGNOSIS — I110 Hypertensive heart disease with heart failure: Secondary | ICD-10-CM | POA: Diagnosis not present

## 2020-10-23 DIAGNOSIS — Z6831 Body mass index (BMI) 31.0-31.9, adult: Secondary | ICD-10-CM | POA: Diagnosis not present

## 2020-10-23 DIAGNOSIS — R509 Fever, unspecified: Secondary | ICD-10-CM | POA: Diagnosis not present

## 2020-10-23 DIAGNOSIS — G2581 Restless legs syndrome: Secondary | ICD-10-CM | POA: Diagnosis not present

## 2020-10-23 DIAGNOSIS — I5030 Unspecified diastolic (congestive) heart failure: Secondary | ICD-10-CM | POA: Diagnosis not present

## 2020-10-23 DIAGNOSIS — T501X6A Underdosing of loop [high-ceiling] diuretics, initial encounter: Secondary | ICD-10-CM | POA: Diagnosis not present

## 2020-10-23 DIAGNOSIS — D696 Thrombocytopenia, unspecified: Secondary | ICD-10-CM | POA: Diagnosis not present

## 2020-10-23 DIAGNOSIS — J189 Pneumonia, unspecified organism: Secondary | ICD-10-CM | POA: Diagnosis not present

## 2020-10-23 DIAGNOSIS — A419 Sepsis, unspecified organism: Secondary | ICD-10-CM | POA: Diagnosis not present

## 2020-10-23 DIAGNOSIS — Z9114 Patient's other noncompliance with medication regimen: Secondary | ICD-10-CM | POA: Diagnosis not present

## 2020-10-23 DIAGNOSIS — R000 Tachycardia, unspecified: Secondary | ICD-10-CM | POA: Diagnosis not present

## 2020-10-23 DIAGNOSIS — Z87442 Personal history of urinary calculi: Secondary | ICD-10-CM | POA: Diagnosis not present

## 2020-10-23 DIAGNOSIS — Z7901 Long term (current) use of anticoagulants: Secondary | ICD-10-CM | POA: Diagnosis not present

## 2020-10-23 DIAGNOSIS — J449 Chronic obstructive pulmonary disease, unspecified: Secondary | ICD-10-CM | POA: Diagnosis not present

## 2020-10-23 DIAGNOSIS — I272 Pulmonary hypertension, unspecified: Secondary | ICD-10-CM | POA: Diagnosis not present

## 2020-10-23 DIAGNOSIS — Z8616 Personal history of COVID-19: Secondary | ICD-10-CM | POA: Diagnosis not present

## 2020-10-23 DIAGNOSIS — I4819 Other persistent atrial fibrillation: Secondary | ICD-10-CM | POA: Diagnosis not present

## 2020-10-23 DIAGNOSIS — R059 Cough, unspecified: Secondary | ICD-10-CM | POA: Diagnosis not present

## 2020-10-23 DIAGNOSIS — E669 Obesity, unspecified: Secondary | ICD-10-CM | POA: Diagnosis not present

## 2020-10-24 DIAGNOSIS — G4733 Obstructive sleep apnea (adult) (pediatric): Secondary | ICD-10-CM | POA: Diagnosis not present

## 2020-10-24 DIAGNOSIS — J9601 Acute respiratory failure with hypoxia: Secondary | ICD-10-CM | POA: Diagnosis not present

## 2020-10-24 DIAGNOSIS — Z8616 Personal history of COVID-19: Secondary | ICD-10-CM | POA: Diagnosis not present

## 2020-10-24 DIAGNOSIS — I4891 Unspecified atrial fibrillation: Secondary | ICD-10-CM | POA: Diagnosis not present

## 2020-10-24 DIAGNOSIS — Z9989 Dependence on other enabling machines and devices: Secondary | ICD-10-CM | POA: Diagnosis not present

## 2020-10-24 DIAGNOSIS — I4819 Other persistent atrial fibrillation: Secondary | ICD-10-CM | POA: Diagnosis not present

## 2020-10-24 DIAGNOSIS — J189 Pneumonia, unspecified organism: Secondary | ICD-10-CM | POA: Diagnosis not present

## 2020-10-24 DIAGNOSIS — J123 Human metapneumovirus pneumonia: Secondary | ICD-10-CM | POA: Diagnosis not present

## 2020-10-24 DIAGNOSIS — I272 Pulmonary hypertension, unspecified: Secondary | ICD-10-CM | POA: Diagnosis not present

## 2020-10-24 DIAGNOSIS — I5030 Unspecified diastolic (congestive) heart failure: Secondary | ICD-10-CM | POA: Diagnosis not present

## 2020-10-24 DIAGNOSIS — Z7901 Long term (current) use of anticoagulants: Secondary | ICD-10-CM | POA: Diagnosis not present

## 2020-10-24 DIAGNOSIS — J449 Chronic obstructive pulmonary disease, unspecified: Secondary | ICD-10-CM | POA: Diagnosis not present

## 2020-10-25 DIAGNOSIS — Z9989 Dependence on other enabling machines and devices: Secondary | ICD-10-CM | POA: Diagnosis not present

## 2020-10-25 DIAGNOSIS — Z7901 Long term (current) use of anticoagulants: Secondary | ICD-10-CM | POA: Diagnosis not present

## 2020-10-25 DIAGNOSIS — J449 Chronic obstructive pulmonary disease, unspecified: Secondary | ICD-10-CM | POA: Diagnosis not present

## 2020-10-25 DIAGNOSIS — G4733 Obstructive sleep apnea (adult) (pediatric): Secondary | ICD-10-CM | POA: Diagnosis not present

## 2020-10-25 DIAGNOSIS — I272 Pulmonary hypertension, unspecified: Secondary | ICD-10-CM | POA: Diagnosis not present

## 2020-10-25 DIAGNOSIS — J123 Human metapneumovirus pneumonia: Secondary | ICD-10-CM | POA: Diagnosis not present

## 2020-10-25 DIAGNOSIS — I10 Essential (primary) hypertension: Secondary | ICD-10-CM | POA: Diagnosis not present

## 2020-10-25 DIAGNOSIS — E119 Type 2 diabetes mellitus without complications: Secondary | ICD-10-CM | POA: Diagnosis not present

## 2020-10-25 DIAGNOSIS — Z6831 Body mass index (BMI) 31.0-31.9, adult: Secondary | ICD-10-CM | POA: Diagnosis not present

## 2020-10-25 DIAGNOSIS — I4891 Unspecified atrial fibrillation: Secondary | ICD-10-CM | POA: Diagnosis not present

## 2020-10-25 DIAGNOSIS — I4821 Permanent atrial fibrillation: Secondary | ICD-10-CM | POA: Diagnosis not present

## 2020-10-25 DIAGNOSIS — J9601 Acute respiratory failure with hypoxia: Secondary | ICD-10-CM | POA: Diagnosis not present

## 2020-10-25 DIAGNOSIS — I5030 Unspecified diastolic (congestive) heart failure: Secondary | ICD-10-CM | POA: Diagnosis not present

## 2020-10-25 DIAGNOSIS — J189 Pneumonia, unspecified organism: Secondary | ICD-10-CM | POA: Diagnosis not present

## 2020-10-26 DIAGNOSIS — R000 Tachycardia, unspecified: Secondary | ICD-10-CM | POA: Diagnosis not present

## 2020-10-26 DIAGNOSIS — J9601 Acute respiratory failure with hypoxia: Secondary | ICD-10-CM | POA: Diagnosis not present

## 2020-10-26 DIAGNOSIS — E8770 Fluid overload, unspecified: Secondary | ICD-10-CM | POA: Diagnosis not present

## 2020-10-26 DIAGNOSIS — A419 Sepsis, unspecified organism: Secondary | ICD-10-CM | POA: Diagnosis not present

## 2020-10-26 DIAGNOSIS — J189 Pneumonia, unspecified organism: Secondary | ICD-10-CM | POA: Diagnosis not present

## 2020-10-27 DIAGNOSIS — E8770 Fluid overload, unspecified: Secondary | ICD-10-CM | POA: Diagnosis not present

## 2020-10-27 DIAGNOSIS — J189 Pneumonia, unspecified organism: Secondary | ICD-10-CM | POA: Diagnosis not present

## 2020-10-27 DIAGNOSIS — J9601 Acute respiratory failure with hypoxia: Secondary | ICD-10-CM | POA: Diagnosis not present

## 2020-10-27 DIAGNOSIS — A419 Sepsis, unspecified organism: Secondary | ICD-10-CM | POA: Diagnosis not present

## 2020-10-27 DIAGNOSIS — R000 Tachycardia, unspecified: Secondary | ICD-10-CM | POA: Diagnosis not present

## 2020-10-28 DIAGNOSIS — R000 Tachycardia, unspecified: Secondary | ICD-10-CM | POA: Diagnosis not present

## 2020-10-28 DIAGNOSIS — J189 Pneumonia, unspecified organism: Secondary | ICD-10-CM | POA: Diagnosis not present

## 2020-10-28 DIAGNOSIS — E8770 Fluid overload, unspecified: Secondary | ICD-10-CM | POA: Diagnosis not present

## 2020-10-28 DIAGNOSIS — A419 Sepsis, unspecified organism: Secondary | ICD-10-CM | POA: Diagnosis not present

## 2020-10-28 DIAGNOSIS — J9601 Acute respiratory failure with hypoxia: Secondary | ICD-10-CM | POA: Diagnosis not present

## 2020-10-29 DIAGNOSIS — E119 Type 2 diabetes mellitus without complications: Secondary | ICD-10-CM | POA: Diagnosis not present

## 2020-10-29 DIAGNOSIS — I4891 Unspecified atrial fibrillation: Secondary | ICD-10-CM | POA: Diagnosis not present

## 2020-10-29 DIAGNOSIS — K7460 Unspecified cirrhosis of liver: Secondary | ICD-10-CM | POA: Diagnosis not present

## 2020-10-29 DIAGNOSIS — Z7984 Long term (current) use of oral hypoglycemic drugs: Secondary | ICD-10-CM | POA: Diagnosis not present

## 2020-10-29 DIAGNOSIS — J123 Human metapneumovirus pneumonia: Secondary | ICD-10-CM | POA: Diagnosis not present

## 2020-10-29 DIAGNOSIS — Z7901 Long term (current) use of anticoagulants: Secondary | ICD-10-CM | POA: Diagnosis not present

## 2020-10-29 DIAGNOSIS — J9601 Acute respiratory failure with hypoxia: Secondary | ICD-10-CM | POA: Diagnosis not present

## 2020-10-29 DIAGNOSIS — I5021 Acute systolic (congestive) heart failure: Secondary | ICD-10-CM | POA: Diagnosis not present

## 2020-10-31 DIAGNOSIS — Z7901 Long term (current) use of anticoagulants: Secondary | ICD-10-CM | POA: Diagnosis not present

## 2020-11-02 DIAGNOSIS — Z7984 Long term (current) use of oral hypoglycemic drugs: Secondary | ICD-10-CM | POA: Diagnosis not present

## 2020-11-02 DIAGNOSIS — J90 Pleural effusion, not elsewhere classified: Secondary | ICD-10-CM | POA: Diagnosis not present

## 2020-11-02 DIAGNOSIS — I5033 Acute on chronic diastolic (congestive) heart failure: Secondary | ICD-10-CM | POA: Diagnosis not present

## 2020-11-02 DIAGNOSIS — Z7901 Long term (current) use of anticoagulants: Secondary | ICD-10-CM | POA: Diagnosis not present

## 2020-11-02 DIAGNOSIS — E119 Type 2 diabetes mellitus without complications: Secondary | ICD-10-CM | POA: Diagnosis not present

## 2020-11-02 DIAGNOSIS — I5023 Acute on chronic systolic (congestive) heart failure: Secondary | ICD-10-CM | POA: Diagnosis not present

## 2020-11-02 DIAGNOSIS — K7460 Unspecified cirrhosis of liver: Secondary | ICD-10-CM | POA: Diagnosis not present

## 2020-11-02 DIAGNOSIS — J9601 Acute respiratory failure with hypoxia: Secondary | ICD-10-CM | POA: Diagnosis not present

## 2020-11-02 DIAGNOSIS — J123 Human metapneumovirus pneumonia: Secondary | ICD-10-CM | POA: Diagnosis not present

## 2020-11-02 DIAGNOSIS — J810 Acute pulmonary edema: Secondary | ICD-10-CM | POA: Diagnosis not present

## 2020-11-02 DIAGNOSIS — I4891 Unspecified atrial fibrillation: Secondary | ICD-10-CM | POA: Diagnosis not present

## 2020-11-05 DIAGNOSIS — Z7901 Long term (current) use of anticoagulants: Secondary | ICD-10-CM | POA: Diagnosis not present

## 2020-11-05 DIAGNOSIS — J123 Human metapneumovirus pneumonia: Secondary | ICD-10-CM | POA: Diagnosis not present

## 2020-11-05 DIAGNOSIS — I4891 Unspecified atrial fibrillation: Secondary | ICD-10-CM | POA: Diagnosis not present

## 2020-11-07 DIAGNOSIS — Z7901 Long term (current) use of anticoagulants: Secondary | ICD-10-CM | POA: Diagnosis not present

## 2020-11-09 DIAGNOSIS — I5033 Acute on chronic diastolic (congestive) heart failure: Secondary | ICD-10-CM | POA: Diagnosis not present

## 2020-11-09 DIAGNOSIS — J90 Pleural effusion, not elsewhere classified: Secondary | ICD-10-CM | POA: Diagnosis not present

## 2020-11-09 DIAGNOSIS — J810 Acute pulmonary edema: Secondary | ICD-10-CM | POA: Diagnosis not present

## 2020-11-09 DIAGNOSIS — J9601 Acute respiratory failure with hypoxia: Secondary | ICD-10-CM | POA: Diagnosis not present

## 2020-11-14 DIAGNOSIS — G4733 Obstructive sleep apnea (adult) (pediatric): Secondary | ICD-10-CM | POA: Diagnosis not present

## 2020-11-20 DIAGNOSIS — Z5181 Encounter for therapeutic drug level monitoring: Secondary | ICD-10-CM | POA: Diagnosis not present

## 2020-11-27 DIAGNOSIS — K582 Mixed irritable bowel syndrome: Secondary | ICD-10-CM | POA: Diagnosis not present

## 2020-11-27 DIAGNOSIS — K7469 Other cirrhosis of liver: Secondary | ICD-10-CM | POA: Diagnosis not present

## 2020-11-27 DIAGNOSIS — I110 Hypertensive heart disease with heart failure: Secondary | ICD-10-CM | POA: Diagnosis not present

## 2020-11-27 DIAGNOSIS — I5033 Acute on chronic diastolic (congestive) heart failure: Secondary | ICD-10-CM | POA: Diagnosis not present

## 2020-11-27 DIAGNOSIS — I10 Essential (primary) hypertension: Secondary | ICD-10-CM | POA: Diagnosis not present

## 2020-11-27 DIAGNOSIS — J90 Pleural effusion, not elsewhere classified: Secondary | ICD-10-CM | POA: Diagnosis not present

## 2020-11-27 DIAGNOSIS — Z7901 Long term (current) use of anticoagulants: Secondary | ICD-10-CM | POA: Diagnosis not present

## 2020-11-27 DIAGNOSIS — I8510 Secondary esophageal varices without bleeding: Secondary | ICD-10-CM | POA: Diagnosis not present

## 2020-11-27 DIAGNOSIS — I272 Pulmonary hypertension, unspecified: Secondary | ICD-10-CM | POA: Diagnosis not present

## 2020-11-27 DIAGNOSIS — R1013 Epigastric pain: Secondary | ICD-10-CM | POA: Diagnosis not present

## 2020-11-27 DIAGNOSIS — I4819 Other persistent atrial fibrillation: Secondary | ICD-10-CM | POA: Diagnosis not present

## 2020-11-27 DIAGNOSIS — G4733 Obstructive sleep apnea (adult) (pediatric): Secondary | ICD-10-CM | POA: Diagnosis not present

## 2020-11-27 DIAGNOSIS — G8929 Other chronic pain: Secondary | ICD-10-CM | POA: Diagnosis not present

## 2020-12-04 DIAGNOSIS — Z7901 Long term (current) use of anticoagulants: Secondary | ICD-10-CM | POA: Diagnosis not present

## 2020-12-09 DIAGNOSIS — R109 Unspecified abdominal pain: Secondary | ICD-10-CM | POA: Diagnosis not present

## 2020-12-09 DIAGNOSIS — R1013 Epigastric pain: Secondary | ICD-10-CM | POA: Diagnosis not present

## 2020-12-09 DIAGNOSIS — K7469 Other cirrhosis of liver: Secondary | ICD-10-CM | POA: Diagnosis not present

## 2020-12-09 DIAGNOSIS — G8929 Other chronic pain: Secondary | ICD-10-CM | POA: Diagnosis not present

## 2020-12-10 DIAGNOSIS — J810 Acute pulmonary edema: Secondary | ICD-10-CM | POA: Diagnosis not present

## 2020-12-10 DIAGNOSIS — J9601 Acute respiratory failure with hypoxia: Secondary | ICD-10-CM | POA: Diagnosis not present

## 2020-12-10 DIAGNOSIS — I5033 Acute on chronic diastolic (congestive) heart failure: Secondary | ICD-10-CM | POA: Diagnosis not present

## 2020-12-10 DIAGNOSIS — J90 Pleural effusion, not elsewhere classified: Secondary | ICD-10-CM | POA: Diagnosis not present

## 2020-12-11 DIAGNOSIS — Z7901 Long term (current) use of anticoagulants: Secondary | ICD-10-CM | POA: Diagnosis not present

## 2020-12-11 DIAGNOSIS — Z7984 Long term (current) use of oral hypoglycemic drugs: Secondary | ICD-10-CM | POA: Diagnosis not present

## 2020-12-11 DIAGNOSIS — E1165 Type 2 diabetes mellitus with hyperglycemia: Secondary | ICD-10-CM | POA: Diagnosis not present

## 2020-12-18 DIAGNOSIS — Z7901 Long term (current) use of anticoagulants: Secondary | ICD-10-CM | POA: Diagnosis not present

## 2020-12-24 DIAGNOSIS — Z6833 Body mass index (BMI) 33.0-33.9, adult: Secondary | ICD-10-CM | POA: Diagnosis not present

## 2020-12-24 DIAGNOSIS — I4891 Unspecified atrial fibrillation: Secondary | ICD-10-CM | POA: Diagnosis not present

## 2020-12-24 DIAGNOSIS — Z8616 Personal history of COVID-19: Secondary | ICD-10-CM | POA: Diagnosis not present

## 2020-12-24 DIAGNOSIS — I10 Essential (primary) hypertension: Secondary | ICD-10-CM | POA: Diagnosis not present

## 2020-12-24 DIAGNOSIS — Z7901 Long term (current) use of anticoagulants: Secondary | ICD-10-CM | POA: Diagnosis not present

## 2020-12-24 DIAGNOSIS — I272 Pulmonary hypertension, unspecified: Secondary | ICD-10-CM | POA: Diagnosis not present

## 2020-12-24 DIAGNOSIS — I4892 Unspecified atrial flutter: Secondary | ICD-10-CM | POA: Diagnosis not present

## 2020-12-24 DIAGNOSIS — G2581 Restless legs syndrome: Secondary | ICD-10-CM | POA: Diagnosis not present

## 2020-12-24 DIAGNOSIS — E119 Type 2 diabetes mellitus without complications: Secondary | ICD-10-CM | POA: Diagnosis not present

## 2020-12-24 DIAGNOSIS — E669 Obesity, unspecified: Secondary | ICD-10-CM | POA: Diagnosis not present

## 2020-12-24 DIAGNOSIS — Z9989 Dependence on other enabling machines and devices: Secondary | ICD-10-CM | POA: Diagnosis not present

## 2020-12-24 DIAGNOSIS — E1169 Type 2 diabetes mellitus with other specified complication: Secondary | ICD-10-CM | POA: Diagnosis not present

## 2020-12-24 DIAGNOSIS — J849 Interstitial pulmonary disease, unspecified: Secondary | ICD-10-CM | POA: Diagnosis not present

## 2020-12-24 DIAGNOSIS — Z90710 Acquired absence of both cervix and uterus: Secondary | ICD-10-CM | POA: Diagnosis not present

## 2020-12-24 DIAGNOSIS — I4581 Long QT syndrome: Secondary | ICD-10-CM | POA: Diagnosis not present

## 2020-12-24 DIAGNOSIS — R001 Bradycardia, unspecified: Secondary | ICD-10-CM | POA: Diagnosis not present

## 2020-12-24 DIAGNOSIS — I5030 Unspecified diastolic (congestive) heart failure: Secondary | ICD-10-CM | POA: Diagnosis not present

## 2020-12-24 DIAGNOSIS — Z5181 Encounter for therapeutic drug level monitoring: Secondary | ICD-10-CM | POA: Diagnosis not present

## 2020-12-24 DIAGNOSIS — I110 Hypertensive heart disease with heart failure: Secondary | ICD-10-CM | POA: Diagnosis not present

## 2020-12-24 DIAGNOSIS — I4819 Other persistent atrial fibrillation: Secondary | ICD-10-CM | POA: Diagnosis not present

## 2020-12-24 DIAGNOSIS — U071 COVID-19: Secondary | ICD-10-CM | POA: Diagnosis not present

## 2020-12-24 DIAGNOSIS — I5032 Chronic diastolic (congestive) heart failure: Secondary | ICD-10-CM | POA: Diagnosis not present

## 2020-12-24 DIAGNOSIS — J449 Chronic obstructive pulmonary disease, unspecified: Secondary | ICD-10-CM | POA: Diagnosis not present

## 2020-12-24 DIAGNOSIS — G4733 Obstructive sleep apnea (adult) (pediatric): Secondary | ICD-10-CM | POA: Diagnosis not present

## 2020-12-24 DIAGNOSIS — Z7984 Long term (current) use of oral hypoglycemic drugs: Secondary | ICD-10-CM | POA: Diagnosis not present

## 2020-12-24 DIAGNOSIS — Z7982 Long term (current) use of aspirin: Secondary | ICD-10-CM | POA: Diagnosis not present

## 2020-12-24 DIAGNOSIS — I4430 Unspecified atrioventricular block: Secondary | ICD-10-CM | POA: Diagnosis not present

## 2020-12-24 DIAGNOSIS — J1282 Pneumonia due to coronavirus disease 2019: Secondary | ICD-10-CM | POA: Diagnosis not present

## 2020-12-24 DIAGNOSIS — Z794 Long term (current) use of insulin: Secondary | ICD-10-CM | POA: Diagnosis not present

## 2020-12-24 DIAGNOSIS — Z79899 Other long term (current) drug therapy: Secondary | ICD-10-CM | POA: Diagnosis not present

## 2020-12-24 DIAGNOSIS — Z8249 Family history of ischemic heart disease and other diseases of the circulatory system: Secondary | ICD-10-CM | POA: Diagnosis not present

## 2020-12-24 DIAGNOSIS — Z833 Family history of diabetes mellitus: Secondary | ICD-10-CM | POA: Diagnosis not present

## 2020-12-24 DIAGNOSIS — R9431 Abnormal electrocardiogram [ECG] [EKG]: Secondary | ICD-10-CM | POA: Diagnosis not present

## 2020-12-24 DIAGNOSIS — I2723 Pulmonary hypertension due to lung diseases and hypoxia: Secondary | ICD-10-CM | POA: Diagnosis not present

## 2020-12-24 DIAGNOSIS — G4700 Insomnia, unspecified: Secondary | ICD-10-CM | POA: Diagnosis not present

## 2021-01-03 DIAGNOSIS — M545 Low back pain, unspecified: Secondary | ICD-10-CM | POA: Diagnosis not present

## 2021-01-03 DIAGNOSIS — I4891 Unspecified atrial fibrillation: Secondary | ICD-10-CM | POA: Diagnosis not present

## 2021-01-06 DIAGNOSIS — I480 Paroxysmal atrial fibrillation: Secondary | ICD-10-CM | POA: Diagnosis not present

## 2021-01-06 DIAGNOSIS — I5033 Acute on chronic diastolic (congestive) heart failure: Secondary | ICD-10-CM | POA: Diagnosis not present

## 2021-01-06 DIAGNOSIS — G4733 Obstructive sleep apnea (adult) (pediatric): Secondary | ICD-10-CM | POA: Diagnosis not present

## 2021-01-06 DIAGNOSIS — I272 Pulmonary hypertension, unspecified: Secondary | ICD-10-CM | POA: Diagnosis not present

## 2021-01-06 DIAGNOSIS — I4819 Other persistent atrial fibrillation: Secondary | ICD-10-CM | POA: Diagnosis not present

## 2021-01-07 DIAGNOSIS — L98491 Non-pressure chronic ulcer of skin of other sites limited to breakdown of skin: Secondary | ICD-10-CM | POA: Diagnosis not present

## 2021-01-07 DIAGNOSIS — R002 Palpitations: Secondary | ICD-10-CM | POA: Diagnosis not present

## 2021-01-07 DIAGNOSIS — J810 Acute pulmonary edema: Secondary | ICD-10-CM | POA: Diagnosis not present

## 2021-01-07 DIAGNOSIS — I5033 Acute on chronic diastolic (congestive) heart failure: Secondary | ICD-10-CM | POA: Diagnosis not present

## 2021-01-07 DIAGNOSIS — J90 Pleural effusion, not elsewhere classified: Secondary | ICD-10-CM | POA: Diagnosis not present

## 2021-01-07 DIAGNOSIS — M2041 Other hammer toe(s) (acquired), right foot: Secondary | ICD-10-CM | POA: Diagnosis not present

## 2021-01-07 DIAGNOSIS — J9601 Acute respiratory failure with hypoxia: Secondary | ICD-10-CM | POA: Diagnosis not present

## 2021-01-08 DIAGNOSIS — I4819 Other persistent atrial fibrillation: Secondary | ICD-10-CM | POA: Diagnosis not present

## 2021-01-08 DIAGNOSIS — L7211 Pilar cyst: Secondary | ICD-10-CM | POA: Diagnosis not present

## 2021-01-08 DIAGNOSIS — E559 Vitamin D deficiency, unspecified: Secondary | ICD-10-CM | POA: Diagnosis not present

## 2021-01-08 DIAGNOSIS — D696 Thrombocytopenia, unspecified: Secondary | ICD-10-CM | POA: Diagnosis not present

## 2021-01-08 DIAGNOSIS — E871 Hypo-osmolality and hyponatremia: Secondary | ICD-10-CM | POA: Diagnosis not present

## 2021-01-08 DIAGNOSIS — L219 Seborrheic dermatitis, unspecified: Secondary | ICD-10-CM | POA: Diagnosis not present

## 2021-01-08 DIAGNOSIS — E1100 Type 2 diabetes mellitus with hyperosmolarity without nonketotic hyperglycemic-hyperosmolar coma (NKHHC): Secondary | ICD-10-CM | POA: Diagnosis not present

## 2021-01-15 DIAGNOSIS — Z7901 Long term (current) use of anticoagulants: Secondary | ICD-10-CM | POA: Diagnosis not present

## 2021-01-17 DIAGNOSIS — Z0000 Encounter for general adult medical examination without abnormal findings: Secondary | ICD-10-CM | POA: Diagnosis not present

## 2021-01-21 DIAGNOSIS — Z13820 Encounter for screening for osteoporosis: Secondary | ICD-10-CM | POA: Diagnosis not present

## 2021-01-21 DIAGNOSIS — M25511 Pain in right shoulder: Secondary | ICD-10-CM | POA: Diagnosis not present

## 2021-01-21 DIAGNOSIS — G8929 Other chronic pain: Secondary | ICD-10-CM | POA: Diagnosis not present

## 2021-01-21 DIAGNOSIS — S7010XA Contusion of unspecified thigh, initial encounter: Secondary | ICD-10-CM | POA: Diagnosis not present

## 2021-01-21 DIAGNOSIS — M47812 Spondylosis without myelopathy or radiculopathy, cervical region: Secondary | ICD-10-CM | POA: Diagnosis not present

## 2021-01-21 DIAGNOSIS — Z1231 Encounter for screening mammogram for malignant neoplasm of breast: Secondary | ICD-10-CM | POA: Diagnosis not present

## 2021-01-21 DIAGNOSIS — M542 Cervicalgia: Secondary | ICD-10-CM | POA: Diagnosis not present

## 2021-01-24 DIAGNOSIS — M89371 Hypertrophy of bone, right ankle and foot: Secondary | ICD-10-CM | POA: Insufficient documentation

## 2021-01-24 DIAGNOSIS — E119 Type 2 diabetes mellitus without complications: Secondary | ICD-10-CM | POA: Diagnosis not present

## 2021-01-30 DIAGNOSIS — H25043 Posterior subcapsular polar age-related cataract, bilateral: Secondary | ICD-10-CM | POA: Diagnosis not present

## 2021-01-30 DIAGNOSIS — H2513 Age-related nuclear cataract, bilateral: Secondary | ICD-10-CM | POA: Diagnosis not present

## 2021-01-30 DIAGNOSIS — H18413 Arcus senilis, bilateral: Secondary | ICD-10-CM | POA: Diagnosis not present

## 2021-01-30 DIAGNOSIS — H18513 Endothelial corneal dystrophy, bilateral: Secondary | ICD-10-CM | POA: Diagnosis not present

## 2021-01-30 DIAGNOSIS — H2511 Age-related nuclear cataract, right eye: Secondary | ICD-10-CM | POA: Diagnosis not present

## 2021-01-31 DIAGNOSIS — Z780 Asymptomatic menopausal state: Secondary | ICD-10-CM | POA: Diagnosis not present

## 2021-01-31 DIAGNOSIS — M85852 Other specified disorders of bone density and structure, left thigh: Secondary | ICD-10-CM | POA: Diagnosis not present

## 2021-01-31 DIAGNOSIS — Z1231 Encounter for screening mammogram for malignant neoplasm of breast: Secondary | ICD-10-CM | POA: Diagnosis not present

## 2021-01-31 DIAGNOSIS — M810 Age-related osteoporosis without current pathological fracture: Secondary | ICD-10-CM | POA: Diagnosis not present

## 2021-02-06 DIAGNOSIS — Z79899 Other long term (current) drug therapy: Secondary | ICD-10-CM | POA: Diagnosis not present

## 2021-02-06 DIAGNOSIS — R768 Other specified abnormal immunological findings in serum: Secondary | ICD-10-CM | POA: Diagnosis not present

## 2021-02-07 DIAGNOSIS — J810 Acute pulmonary edema: Secondary | ICD-10-CM | POA: Diagnosis not present

## 2021-02-07 DIAGNOSIS — J9601 Acute respiratory failure with hypoxia: Secondary | ICD-10-CM | POA: Diagnosis not present

## 2021-02-07 DIAGNOSIS — J90 Pleural effusion, not elsewhere classified: Secondary | ICD-10-CM | POA: Diagnosis not present

## 2021-02-07 DIAGNOSIS — I5033 Acute on chronic diastolic (congestive) heart failure: Secondary | ICD-10-CM | POA: Diagnosis not present

## 2021-02-12 DIAGNOSIS — Z7901 Long term (current) use of anticoagulants: Secondary | ICD-10-CM | POA: Diagnosis not present

## 2021-02-21 DIAGNOSIS — Z7901 Long term (current) use of anticoagulants: Secondary | ICD-10-CM | POA: Diagnosis not present

## 2021-02-21 DIAGNOSIS — M89371 Hypertrophy of bone, right ankle and foot: Secondary | ICD-10-CM | POA: Diagnosis not present

## 2021-02-21 DIAGNOSIS — E119 Type 2 diabetes mellitus without complications: Secondary | ICD-10-CM | POA: Diagnosis not present

## 2021-02-24 DIAGNOSIS — R942 Abnormal results of pulmonary function studies: Secondary | ICD-10-CM | POA: Diagnosis not present

## 2021-02-24 DIAGNOSIS — I272 Pulmonary hypertension, unspecified: Secondary | ICD-10-CM | POA: Diagnosis not present

## 2021-02-24 DIAGNOSIS — G2581 Restless legs syndrome: Secondary | ICD-10-CM | POA: Diagnosis not present

## 2021-02-24 DIAGNOSIS — I5033 Acute on chronic diastolic (congestive) heart failure: Secondary | ICD-10-CM | POA: Diagnosis not present

## 2021-02-24 DIAGNOSIS — J90 Pleural effusion, not elsewhere classified: Secondary | ICD-10-CM | POA: Diagnosis not present

## 2021-02-24 DIAGNOSIS — G4733 Obstructive sleep apnea (adult) (pediatric): Secondary | ICD-10-CM | POA: Diagnosis not present

## 2021-02-26 DIAGNOSIS — E119 Type 2 diabetes mellitus without complications: Secondary | ICD-10-CM | POA: Diagnosis not present

## 2021-02-26 DIAGNOSIS — Z7984 Long term (current) use of oral hypoglycemic drugs: Secondary | ICD-10-CM | POA: Diagnosis not present

## 2021-02-26 DIAGNOSIS — M89371 Hypertrophy of bone, right ankle and foot: Secondary | ICD-10-CM | POA: Diagnosis not present

## 2021-03-05 DIAGNOSIS — R0602 Shortness of breath: Secondary | ICD-10-CM | POA: Diagnosis not present

## 2021-03-07 DIAGNOSIS — M79605 Pain in left leg: Secondary | ICD-10-CM | POA: Diagnosis not present

## 2021-03-07 DIAGNOSIS — M79604 Pain in right leg: Secondary | ICD-10-CM | POA: Diagnosis not present

## 2021-03-07 DIAGNOSIS — R0602 Shortness of breath: Secondary | ICD-10-CM | POA: Diagnosis not present

## 2021-03-07 DIAGNOSIS — Z7189 Other specified counseling: Secondary | ICD-10-CM | POA: Diagnosis not present

## 2021-03-09 DIAGNOSIS — I5033 Acute on chronic diastolic (congestive) heart failure: Secondary | ICD-10-CM | POA: Diagnosis not present

## 2021-03-09 DIAGNOSIS — J9601 Acute respiratory failure with hypoxia: Secondary | ICD-10-CM | POA: Diagnosis not present

## 2021-03-09 DIAGNOSIS — J90 Pleural effusion, not elsewhere classified: Secondary | ICD-10-CM | POA: Diagnosis not present

## 2021-03-09 DIAGNOSIS — J810 Acute pulmonary edema: Secondary | ICD-10-CM | POA: Diagnosis not present

## 2021-03-12 DIAGNOSIS — Z7901 Long term (current) use of anticoagulants: Secondary | ICD-10-CM | POA: Diagnosis not present

## 2021-03-18 DIAGNOSIS — H2510 Age-related nuclear cataract, unspecified eye: Secondary | ICD-10-CM | POA: Insufficient documentation

## 2021-03-19 DIAGNOSIS — G2581 Restless legs syndrome: Secondary | ICD-10-CM | POA: Diagnosis not present

## 2021-03-19 DIAGNOSIS — Z8616 Personal history of COVID-19: Secondary | ICD-10-CM | POA: Diagnosis not present

## 2021-03-19 DIAGNOSIS — M069 Rheumatoid arthritis, unspecified: Secondary | ICD-10-CM | POA: Diagnosis not present

## 2021-03-19 DIAGNOSIS — Z6832 Body mass index (BMI) 32.0-32.9, adult: Secondary | ICD-10-CM | POA: Diagnosis not present

## 2021-03-19 DIAGNOSIS — H2511 Age-related nuclear cataract, right eye: Secondary | ICD-10-CM | POA: Diagnosis not present

## 2021-03-19 DIAGNOSIS — F419 Anxiety disorder, unspecified: Secondary | ICD-10-CM | POA: Diagnosis not present

## 2021-03-19 DIAGNOSIS — E119 Type 2 diabetes mellitus without complications: Secondary | ICD-10-CM | POA: Diagnosis not present

## 2021-03-19 DIAGNOSIS — F3289 Other specified depressive episodes: Secondary | ICD-10-CM | POA: Diagnosis not present

## 2021-03-19 DIAGNOSIS — I10 Essential (primary) hypertension: Secondary | ICD-10-CM | POA: Diagnosis not present

## 2021-03-19 DIAGNOSIS — G4733 Obstructive sleep apnea (adult) (pediatric): Secondary | ICD-10-CM | POA: Diagnosis not present

## 2021-03-19 DIAGNOSIS — F32A Depression, unspecified: Secondary | ICD-10-CM | POA: Diagnosis not present

## 2021-03-19 DIAGNOSIS — Z79899 Other long term (current) drug therapy: Secondary | ICD-10-CM | POA: Diagnosis not present

## 2021-03-19 DIAGNOSIS — Z7982 Long term (current) use of aspirin: Secondary | ICD-10-CM | POA: Diagnosis not present

## 2021-03-19 DIAGNOSIS — Z7901 Long term (current) use of anticoagulants: Secondary | ICD-10-CM | POA: Diagnosis not present

## 2021-03-19 DIAGNOSIS — E1136 Type 2 diabetes mellitus with diabetic cataract: Secondary | ICD-10-CM | POA: Diagnosis not present

## 2021-03-19 DIAGNOSIS — Z7984 Long term (current) use of oral hypoglycemic drugs: Secondary | ICD-10-CM | POA: Diagnosis not present

## 2021-03-19 DIAGNOSIS — K219 Gastro-esophageal reflux disease without esophagitis: Secondary | ICD-10-CM | POA: Diagnosis not present

## 2021-03-19 DIAGNOSIS — F411 Generalized anxiety disorder: Secondary | ICD-10-CM | POA: Diagnosis not present

## 2021-03-19 DIAGNOSIS — M109 Gout, unspecified: Secondary | ICD-10-CM | POA: Diagnosis not present

## 2021-03-19 DIAGNOSIS — E669 Obesity, unspecified: Secondary | ICD-10-CM | POA: Diagnosis not present

## 2021-03-20 DIAGNOSIS — H2512 Age-related nuclear cataract, left eye: Secondary | ICD-10-CM | POA: Diagnosis not present

## 2021-03-25 DIAGNOSIS — G4733 Obstructive sleep apnea (adult) (pediatric): Secondary | ICD-10-CM | POA: Diagnosis not present

## 2021-03-26 DIAGNOSIS — I272 Pulmonary hypertension, unspecified: Secondary | ICD-10-CM | POA: Diagnosis not present

## 2021-03-26 DIAGNOSIS — I10 Essential (primary) hypertension: Secondary | ICD-10-CM | POA: Diagnosis not present

## 2021-03-26 DIAGNOSIS — J309 Allergic rhinitis, unspecified: Secondary | ICD-10-CM | POA: Diagnosis not present

## 2021-03-26 DIAGNOSIS — G4733 Obstructive sleep apnea (adult) (pediatric): Secondary | ICD-10-CM | POA: Diagnosis not present

## 2021-03-26 DIAGNOSIS — I4819 Other persistent atrial fibrillation: Secondary | ICD-10-CM | POA: Diagnosis not present

## 2021-03-26 DIAGNOSIS — Z789 Other specified health status: Secondary | ICD-10-CM | POA: Diagnosis not present

## 2021-03-26 DIAGNOSIS — J90 Pleural effusion, not elsewhere classified: Secondary | ICD-10-CM | POA: Diagnosis not present

## 2021-03-26 DIAGNOSIS — E1100 Type 2 diabetes mellitus with hyperosmolarity without nonketotic hyperglycemic-hyperosmolar coma (NKHHC): Secondary | ICD-10-CM | POA: Diagnosis not present

## 2021-03-26 DIAGNOSIS — K219 Gastro-esophageal reflux disease without esophagitis: Secondary | ICD-10-CM | POA: Diagnosis not present

## 2021-03-26 DIAGNOSIS — K582 Mixed irritable bowel syndrome: Secondary | ICD-10-CM | POA: Diagnosis not present

## 2021-03-26 DIAGNOSIS — I83812 Varicose veins of left lower extremities with pain: Secondary | ICD-10-CM | POA: Diagnosis not present

## 2021-03-26 DIAGNOSIS — M48061 Spinal stenosis, lumbar region without neurogenic claudication: Secondary | ICD-10-CM | POA: Diagnosis not present

## 2021-04-02 DIAGNOSIS — K219 Gastro-esophageal reflux disease without esophagitis: Secondary | ICD-10-CM | POA: Diagnosis not present

## 2021-04-02 DIAGNOSIS — Z7982 Long term (current) use of aspirin: Secondary | ICD-10-CM | POA: Diagnosis not present

## 2021-04-02 DIAGNOSIS — M1990 Unspecified osteoarthritis, unspecified site: Secondary | ICD-10-CM | POA: Diagnosis not present

## 2021-04-02 DIAGNOSIS — G4733 Obstructive sleep apnea (adult) (pediatric): Secondary | ICD-10-CM | POA: Diagnosis not present

## 2021-04-02 DIAGNOSIS — I482 Chronic atrial fibrillation, unspecified: Secondary | ICD-10-CM | POA: Diagnosis not present

## 2021-04-02 DIAGNOSIS — Z888 Allergy status to other drugs, medicaments and biological substances status: Secondary | ICD-10-CM | POA: Diagnosis not present

## 2021-04-02 DIAGNOSIS — Z6832 Body mass index (BMI) 32.0-32.9, adult: Secondary | ICD-10-CM | POA: Diagnosis not present

## 2021-04-02 DIAGNOSIS — H18512 Endothelial corneal dystrophy, left eye: Secondary | ICD-10-CM | POA: Diagnosis not present

## 2021-04-02 DIAGNOSIS — H18413 Arcus senilis, bilateral: Secondary | ICD-10-CM | POA: Diagnosis not present

## 2021-04-02 DIAGNOSIS — Z9841 Cataract extraction status, right eye: Secondary | ICD-10-CM | POA: Diagnosis not present

## 2021-04-02 DIAGNOSIS — E669 Obesity, unspecified: Secondary | ICD-10-CM | POA: Diagnosis not present

## 2021-04-02 DIAGNOSIS — H2512 Age-related nuclear cataract, left eye: Secondary | ICD-10-CM | POA: Diagnosis not present

## 2021-04-02 DIAGNOSIS — F321 Major depressive disorder, single episode, moderate: Secondary | ICD-10-CM | POA: Diagnosis not present

## 2021-04-02 DIAGNOSIS — E1136 Type 2 diabetes mellitus with diabetic cataract: Secondary | ICD-10-CM | POA: Diagnosis not present

## 2021-04-02 DIAGNOSIS — Z79899 Other long term (current) drug therapy: Secondary | ICD-10-CM | POA: Diagnosis not present

## 2021-04-02 DIAGNOSIS — G2581 Restless legs syndrome: Secondary | ICD-10-CM | POA: Diagnosis not present

## 2021-04-02 DIAGNOSIS — I10 Essential (primary) hypertension: Secondary | ICD-10-CM | POA: Diagnosis not present

## 2021-04-02 DIAGNOSIS — Z7901 Long term (current) use of anticoagulants: Secondary | ICD-10-CM | POA: Diagnosis not present

## 2021-04-02 DIAGNOSIS — H02831 Dermatochalasis of right upper eyelid: Secondary | ICD-10-CM | POA: Diagnosis not present

## 2021-04-02 DIAGNOSIS — F411 Generalized anxiety disorder: Secondary | ICD-10-CM | POA: Diagnosis not present

## 2021-04-02 DIAGNOSIS — Z7984 Long term (current) use of oral hypoglycemic drugs: Secondary | ICD-10-CM | POA: Diagnosis not present

## 2021-04-02 DIAGNOSIS — Z961 Presence of intraocular lens: Secondary | ICD-10-CM | POA: Diagnosis not present

## 2021-04-02 DIAGNOSIS — H02834 Dermatochalasis of left upper eyelid: Secondary | ICD-10-CM | POA: Diagnosis not present

## 2021-04-07 DIAGNOSIS — Z7901 Long term (current) use of anticoagulants: Secondary | ICD-10-CM | POA: Diagnosis not present

## 2021-04-09 DIAGNOSIS — I5033 Acute on chronic diastolic (congestive) heart failure: Secondary | ICD-10-CM | POA: Diagnosis not present

## 2021-04-09 DIAGNOSIS — J90 Pleural effusion, not elsewhere classified: Secondary | ICD-10-CM | POA: Diagnosis not present

## 2021-04-09 DIAGNOSIS — J810 Acute pulmonary edema: Secondary | ICD-10-CM | POA: Diagnosis not present

## 2021-04-09 DIAGNOSIS — J9601 Acute respiratory failure with hypoxia: Secondary | ICD-10-CM | POA: Diagnosis not present

## 2021-04-10 DIAGNOSIS — M25562 Pain in left knee: Secondary | ICD-10-CM | POA: Diagnosis not present

## 2021-04-10 DIAGNOSIS — M1712 Unilateral primary osteoarthritis, left knee: Secondary | ICD-10-CM | POA: Diagnosis not present

## 2021-04-29 DIAGNOSIS — R194 Change in bowel habit: Secondary | ICD-10-CM | POA: Diagnosis not present

## 2021-04-29 DIAGNOSIS — R197 Diarrhea, unspecified: Secondary | ICD-10-CM | POA: Diagnosis not present

## 2021-04-29 DIAGNOSIS — K7469 Other cirrhosis of liver: Secondary | ICD-10-CM | POA: Diagnosis not present

## 2021-05-02 DIAGNOSIS — R197 Diarrhea, unspecified: Secondary | ICD-10-CM | POA: Diagnosis not present

## 2021-05-06 DIAGNOSIS — F3341 Major depressive disorder, recurrent, in partial remission: Secondary | ICD-10-CM | POA: Diagnosis not present

## 2021-05-06 DIAGNOSIS — R2242 Localized swelling, mass and lump, left lower limb: Secondary | ICD-10-CM | POA: Diagnosis not present

## 2021-05-06 DIAGNOSIS — I4891 Unspecified atrial fibrillation: Secondary | ICD-10-CM | POA: Diagnosis not present

## 2021-05-06 DIAGNOSIS — Z7901 Long term (current) use of anticoagulants: Secondary | ICD-10-CM | POA: Diagnosis not present

## 2021-05-06 DIAGNOSIS — I872 Venous insufficiency (chronic) (peripheral): Secondary | ICD-10-CM | POA: Diagnosis not present

## 2021-05-06 DIAGNOSIS — G4733 Obstructive sleep apnea (adult) (pediatric): Secondary | ICD-10-CM | POA: Diagnosis not present

## 2021-05-06 DIAGNOSIS — M79605 Pain in left leg: Secondary | ICD-10-CM | POA: Diagnosis not present

## 2021-05-06 DIAGNOSIS — E559 Vitamin D deficiency, unspecified: Secondary | ICD-10-CM | POA: Diagnosis not present

## 2021-05-06 DIAGNOSIS — G4709 Other insomnia: Secondary | ICD-10-CM | POA: Diagnosis not present

## 2021-05-06 DIAGNOSIS — E1100 Type 2 diabetes mellitus with hyperosmolarity without nonketotic hyperglycemic-hyperosmolar coma (NKHHC): Secondary | ICD-10-CM | POA: Diagnosis not present

## 2021-05-06 DIAGNOSIS — I83813 Varicose veins of bilateral lower extremities with pain: Secondary | ICD-10-CM | POA: Diagnosis not present

## 2021-05-06 DIAGNOSIS — I110 Hypertensive heart disease with heart failure: Secondary | ICD-10-CM | POA: Diagnosis not present

## 2021-05-06 DIAGNOSIS — I509 Heart failure, unspecified: Secondary | ICD-10-CM | POA: Diagnosis not present

## 2021-05-08 DIAGNOSIS — M79605 Pain in left leg: Secondary | ICD-10-CM | POA: Diagnosis not present

## 2021-05-08 DIAGNOSIS — I878 Other specified disorders of veins: Secondary | ICD-10-CM | POA: Diagnosis not present

## 2021-05-09 DIAGNOSIS — J90 Pleural effusion, not elsewhere classified: Secondary | ICD-10-CM | POA: Diagnosis not present

## 2021-05-09 DIAGNOSIS — J9601 Acute respiratory failure with hypoxia: Secondary | ICD-10-CM | POA: Diagnosis not present

## 2021-05-09 DIAGNOSIS — I5033 Acute on chronic diastolic (congestive) heart failure: Secondary | ICD-10-CM | POA: Diagnosis not present

## 2021-05-09 DIAGNOSIS — J810 Acute pulmonary edema: Secondary | ICD-10-CM | POA: Diagnosis not present

## 2021-06-02 DIAGNOSIS — M25552 Pain in left hip: Secondary | ICD-10-CM | POA: Diagnosis not present

## 2021-06-03 DIAGNOSIS — Z7901 Long term (current) use of anticoagulants: Secondary | ICD-10-CM | POA: Diagnosis not present

## 2021-06-04 DIAGNOSIS — Z7901 Long term (current) use of anticoagulants: Secondary | ICD-10-CM | POA: Diagnosis not present

## 2021-06-04 DIAGNOSIS — M79605 Pain in left leg: Secondary | ICD-10-CM | POA: Diagnosis not present

## 2021-06-04 DIAGNOSIS — I872 Venous insufficiency (chronic) (peripheral): Secondary | ICD-10-CM | POA: Diagnosis not present

## 2021-06-04 DIAGNOSIS — M7989 Other specified soft tissue disorders: Secondary | ICD-10-CM | POA: Diagnosis not present

## 2021-06-04 DIAGNOSIS — M79604 Pain in right leg: Secondary | ICD-10-CM | POA: Diagnosis not present

## 2021-06-04 DIAGNOSIS — I8393 Asymptomatic varicose veins of bilateral lower extremities: Secondary | ICD-10-CM | POA: Diagnosis not present

## 2021-06-09 DIAGNOSIS — J90 Pleural effusion, not elsewhere classified: Secondary | ICD-10-CM | POA: Diagnosis not present

## 2021-06-09 DIAGNOSIS — I5033 Acute on chronic diastolic (congestive) heart failure: Secondary | ICD-10-CM | POA: Diagnosis not present

## 2021-06-09 DIAGNOSIS — J810 Acute pulmonary edema: Secondary | ICD-10-CM | POA: Diagnosis not present

## 2021-06-09 DIAGNOSIS — J9601 Acute respiratory failure with hypoxia: Secondary | ICD-10-CM | POA: Diagnosis not present

## 2021-06-19 DIAGNOSIS — K7469 Other cirrhosis of liver: Secondary | ICD-10-CM | POA: Diagnosis not present

## 2021-06-19 DIAGNOSIS — K7460 Unspecified cirrhosis of liver: Secondary | ICD-10-CM | POA: Diagnosis not present

## 2021-06-25 DIAGNOSIS — E119 Type 2 diabetes mellitus without complications: Secondary | ICD-10-CM | POA: Diagnosis not present

## 2021-06-25 DIAGNOSIS — R519 Headache, unspecified: Secondary | ICD-10-CM | POA: Diagnosis not present

## 2021-06-25 DIAGNOSIS — I4891 Unspecified atrial fibrillation: Secondary | ICD-10-CM | POA: Diagnosis not present

## 2021-06-25 DIAGNOSIS — Z7901 Long term (current) use of anticoagulants: Secondary | ICD-10-CM | POA: Diagnosis not present

## 2021-06-25 DIAGNOSIS — Z79899 Other long term (current) drug therapy: Secondary | ICD-10-CM | POA: Diagnosis not present

## 2021-06-25 DIAGNOSIS — I10 Essential (primary) hypertension: Secondary | ICD-10-CM | POA: Diagnosis not present

## 2021-06-25 DIAGNOSIS — R079 Chest pain, unspecified: Secondary | ICD-10-CM | POA: Diagnosis not present

## 2021-06-25 DIAGNOSIS — F419 Anxiety disorder, unspecified: Secondary | ICD-10-CM | POA: Diagnosis not present

## 2021-06-25 DIAGNOSIS — Z043 Encounter for examination and observation following other accident: Secondary | ICD-10-CM | POA: Diagnosis not present

## 2021-06-25 DIAGNOSIS — F32A Depression, unspecified: Secondary | ICD-10-CM | POA: Diagnosis not present

## 2021-06-25 DIAGNOSIS — Z7984 Long term (current) use of oral hypoglycemic drugs: Secondary | ICD-10-CM | POA: Diagnosis not present

## 2021-06-27 DIAGNOSIS — H43811 Vitreous degeneration, right eye: Secondary | ICD-10-CM | POA: Diagnosis not present

## 2021-07-02 DIAGNOSIS — Z7901 Long term (current) use of anticoagulants: Secondary | ICD-10-CM | POA: Diagnosis not present

## 2021-07-04 DIAGNOSIS — K5730 Diverticulosis of large intestine without perforation or abscess without bleeding: Secondary | ICD-10-CM | POA: Diagnosis not present

## 2021-07-04 DIAGNOSIS — R197 Diarrhea, unspecified: Secondary | ICD-10-CM | POA: Diagnosis not present

## 2021-07-04 DIAGNOSIS — Z79899 Other long term (current) drug therapy: Secondary | ICD-10-CM | POA: Diagnosis not present

## 2021-07-04 DIAGNOSIS — R194 Change in bowel habit: Secondary | ICD-10-CM | POA: Diagnosis not present

## 2021-07-04 DIAGNOSIS — K317 Polyp of stomach and duodenum: Secondary | ICD-10-CM | POA: Diagnosis not present

## 2021-07-04 DIAGNOSIS — K7469 Other cirrhosis of liver: Secondary | ICD-10-CM | POA: Diagnosis not present

## 2021-07-04 DIAGNOSIS — K2950 Unspecified chronic gastritis without bleeding: Secondary | ICD-10-CM | POA: Diagnosis not present

## 2021-07-04 DIAGNOSIS — K641 Second degree hemorrhoids: Secondary | ICD-10-CM | POA: Diagnosis not present

## 2021-07-04 DIAGNOSIS — K449 Diaphragmatic hernia without obstruction or gangrene: Secondary | ICD-10-CM | POA: Diagnosis not present

## 2021-07-10 DIAGNOSIS — J9601 Acute respiratory failure with hypoxia: Secondary | ICD-10-CM | POA: Diagnosis not present

## 2021-07-10 DIAGNOSIS — I5033 Acute on chronic diastolic (congestive) heart failure: Secondary | ICD-10-CM | POA: Diagnosis not present

## 2021-07-10 DIAGNOSIS — J90 Pleural effusion, not elsewhere classified: Secondary | ICD-10-CM | POA: Diagnosis not present

## 2021-07-10 DIAGNOSIS — J810 Acute pulmonary edema: Secondary | ICD-10-CM | POA: Diagnosis not present

## 2021-07-14 DIAGNOSIS — Z7901 Long term (current) use of anticoagulants: Secondary | ICD-10-CM | POA: Diagnosis not present

## 2021-07-15 DIAGNOSIS — B351 Tinea unguium: Secondary | ICD-10-CM | POA: Diagnosis not present

## 2021-07-15 DIAGNOSIS — E119 Type 2 diabetes mellitus without complications: Secondary | ICD-10-CM | POA: Diagnosis not present

## 2021-07-15 DIAGNOSIS — I5033 Acute on chronic diastolic (congestive) heart failure: Secondary | ICD-10-CM | POA: Diagnosis not present

## 2021-07-17 DIAGNOSIS — M1712 Unilateral primary osteoarthritis, left knee: Secondary | ICD-10-CM | POA: Diagnosis not present

## 2021-07-18 DIAGNOSIS — R0602 Shortness of breath: Secondary | ICD-10-CM | POA: Diagnosis not present

## 2021-07-18 DIAGNOSIS — I5033 Acute on chronic diastolic (congestive) heart failure: Secondary | ICD-10-CM | POA: Diagnosis not present

## 2021-08-07 DIAGNOSIS — K219 Gastro-esophageal reflux disease without esophagitis: Secondary | ICD-10-CM | POA: Diagnosis not present

## 2021-08-07 DIAGNOSIS — E1100 Type 2 diabetes mellitus with hyperosmolarity without nonketotic hyperglycemic-hyperosmolar coma (NKHHC): Secondary | ICD-10-CM | POA: Diagnosis not present

## 2021-08-07 DIAGNOSIS — R0602 Shortness of breath: Secondary | ICD-10-CM | POA: Diagnosis not present

## 2021-08-07 DIAGNOSIS — Z79899 Other long term (current) drug therapy: Secondary | ICD-10-CM | POA: Diagnosis not present

## 2021-08-07 DIAGNOSIS — G4733 Obstructive sleep apnea (adult) (pediatric): Secondary | ICD-10-CM | POA: Diagnosis not present

## 2021-08-07 DIAGNOSIS — I10 Essential (primary) hypertension: Secondary | ICD-10-CM | POA: Diagnosis not present

## 2021-08-07 DIAGNOSIS — Z23 Encounter for immunization: Secondary | ICD-10-CM | POA: Diagnosis not present

## 2021-08-07 DIAGNOSIS — R768 Other specified abnormal immunological findings in serum: Secondary | ICD-10-CM | POA: Diagnosis not present

## 2021-08-08 DIAGNOSIS — Z79899 Other long term (current) drug therapy: Secondary | ICD-10-CM | POA: Diagnosis not present

## 2021-08-08 DIAGNOSIS — R768 Other specified abnormal immunological findings in serum: Secondary | ICD-10-CM | POA: Diagnosis not present

## 2021-08-09 DIAGNOSIS — I5033 Acute on chronic diastolic (congestive) heart failure: Secondary | ICD-10-CM | POA: Diagnosis not present

## 2021-08-09 DIAGNOSIS — J9601 Acute respiratory failure with hypoxia: Secondary | ICD-10-CM | POA: Diagnosis not present

## 2021-08-09 DIAGNOSIS — J810 Acute pulmonary edema: Secondary | ICD-10-CM | POA: Diagnosis not present

## 2021-08-09 DIAGNOSIS — J90 Pleural effusion, not elsewhere classified: Secondary | ICD-10-CM | POA: Diagnosis not present

## 2021-08-11 DIAGNOSIS — Z7901 Long term (current) use of anticoagulants: Secondary | ICD-10-CM | POA: Diagnosis not present

## 2021-08-18 DIAGNOSIS — F5105 Insomnia due to other mental disorder: Secondary | ICD-10-CM | POA: Diagnosis not present

## 2021-08-18 DIAGNOSIS — R2689 Other abnormalities of gait and mobility: Secondary | ICD-10-CM | POA: Diagnosis not present

## 2021-08-18 DIAGNOSIS — F419 Anxiety disorder, unspecified: Secondary | ICD-10-CM | POA: Diagnosis not present

## 2021-08-18 DIAGNOSIS — E1165 Type 2 diabetes mellitus with hyperglycemia: Secondary | ICD-10-CM | POA: Diagnosis not present

## 2021-08-18 DIAGNOSIS — H539 Unspecified visual disturbance: Secondary | ICD-10-CM | POA: Diagnosis not present

## 2021-08-20 DIAGNOSIS — G2581 Restless legs syndrome: Secondary | ICD-10-CM | POA: Diagnosis not present

## 2021-08-20 DIAGNOSIS — Z7901 Long term (current) use of anticoagulants: Secondary | ICD-10-CM | POA: Diagnosis not present

## 2021-08-20 DIAGNOSIS — I872 Venous insufficiency (chronic) (peripheral): Secondary | ICD-10-CM | POA: Diagnosis not present

## 2021-08-26 DIAGNOSIS — I272 Pulmonary hypertension, unspecified: Secondary | ICD-10-CM | POA: Diagnosis not present

## 2021-08-27 DIAGNOSIS — I272 Pulmonary hypertension, unspecified: Secondary | ICD-10-CM | POA: Diagnosis not present

## 2021-08-27 DIAGNOSIS — J984 Other disorders of lung: Secondary | ICD-10-CM | POA: Diagnosis not present

## 2021-09-05 DIAGNOSIS — I10 Essential (primary) hypertension: Secondary | ICD-10-CM | POA: Diagnosis not present

## 2021-09-05 DIAGNOSIS — Z7984 Long term (current) use of oral hypoglycemic drugs: Secondary | ICD-10-CM | POA: Diagnosis not present

## 2021-09-05 DIAGNOSIS — K219 Gastro-esophageal reflux disease without esophagitis: Secondary | ICD-10-CM | POA: Diagnosis not present

## 2021-09-05 DIAGNOSIS — S0990XA Unspecified injury of head, initial encounter: Secondary | ICD-10-CM | POA: Diagnosis not present

## 2021-09-05 DIAGNOSIS — Z79899 Other long term (current) drug therapy: Secondary | ICD-10-CM | POA: Diagnosis not present

## 2021-09-05 DIAGNOSIS — M5137 Other intervertebral disc degeneration, lumbosacral region: Secondary | ICD-10-CM | POA: Diagnosis not present

## 2021-09-05 DIAGNOSIS — E119 Type 2 diabetes mellitus without complications: Secondary | ICD-10-CM | POA: Diagnosis not present

## 2021-09-05 DIAGNOSIS — Z7982 Long term (current) use of aspirin: Secondary | ICD-10-CM | POA: Diagnosis not present

## 2021-09-05 DIAGNOSIS — R519 Headache, unspecified: Secondary | ICD-10-CM | POA: Diagnosis not present

## 2021-09-05 DIAGNOSIS — I4891 Unspecified atrial fibrillation: Secondary | ICD-10-CM | POA: Diagnosis not present

## 2021-09-09 DIAGNOSIS — J810 Acute pulmonary edema: Secondary | ICD-10-CM | POA: Diagnosis not present

## 2021-09-09 DIAGNOSIS — J90 Pleural effusion, not elsewhere classified: Secondary | ICD-10-CM | POA: Diagnosis not present

## 2021-09-09 DIAGNOSIS — I5033 Acute on chronic diastolic (congestive) heart failure: Secondary | ICD-10-CM | POA: Diagnosis not present

## 2021-09-09 DIAGNOSIS — J9601 Acute respiratory failure with hypoxia: Secondary | ICD-10-CM | POA: Diagnosis not present

## 2021-09-10 DIAGNOSIS — Z888 Allergy status to other drugs, medicaments and biological substances status: Secondary | ICD-10-CM | POA: Diagnosis not present

## 2021-09-10 DIAGNOSIS — R000 Tachycardia, unspecified: Secondary | ICD-10-CM | POA: Diagnosis not present

## 2021-09-10 DIAGNOSIS — J690 Pneumonitis due to inhalation of food and vomit: Secondary | ICD-10-CM | POA: Diagnosis not present

## 2021-09-10 DIAGNOSIS — Z09 Encounter for follow-up examination after completed treatment for conditions other than malignant neoplasm: Secondary | ICD-10-CM | POA: Diagnosis not present

## 2021-09-10 DIAGNOSIS — B974 Respiratory syncytial virus as the cause of diseases classified elsewhere: Secondary | ICD-10-CM | POA: Diagnosis not present

## 2021-09-10 DIAGNOSIS — R0602 Shortness of breath: Secondary | ICD-10-CM | POA: Diagnosis not present

## 2021-09-10 DIAGNOSIS — R739 Hyperglycemia, unspecified: Secondary | ICD-10-CM | POA: Diagnosis not present

## 2021-09-10 DIAGNOSIS — I482 Chronic atrial fibrillation, unspecified: Secondary | ICD-10-CM | POA: Diagnosis not present

## 2021-09-10 DIAGNOSIS — I10 Essential (primary) hypertension: Secondary | ICD-10-CM | POA: Diagnosis not present

## 2021-09-10 DIAGNOSIS — I272 Pulmonary hypertension, unspecified: Secondary | ICD-10-CM | POA: Diagnosis not present

## 2021-09-10 DIAGNOSIS — R918 Other nonspecific abnormal finding of lung field: Secondary | ICD-10-CM | POA: Diagnosis not present

## 2021-09-10 DIAGNOSIS — I517 Cardiomegaly: Secondary | ICD-10-CM | POA: Diagnosis not present

## 2021-09-10 DIAGNOSIS — J189 Pneumonia, unspecified organism: Secondary | ICD-10-CM | POA: Diagnosis not present

## 2021-09-10 DIAGNOSIS — Z8616 Personal history of COVID-19: Secondary | ICD-10-CM | POA: Diagnosis not present

## 2021-09-10 DIAGNOSIS — G4733 Obstructive sleep apnea (adult) (pediatric): Secondary | ICD-10-CM | POA: Diagnosis not present

## 2021-09-10 DIAGNOSIS — Z79899 Other long term (current) drug therapy: Secondary | ICD-10-CM | POA: Diagnosis not present

## 2021-09-10 DIAGNOSIS — I639 Cerebral infarction, unspecified: Secondary | ICD-10-CM | POA: Diagnosis not present

## 2021-09-10 DIAGNOSIS — Z7982 Long term (current) use of aspirin: Secondary | ICD-10-CM | POA: Diagnosis not present

## 2021-09-10 DIAGNOSIS — J9601 Acute respiratory failure with hypoxia: Secondary | ICD-10-CM | POA: Diagnosis not present

## 2021-09-10 DIAGNOSIS — M069 Rheumatoid arthritis, unspecified: Secondary | ICD-10-CM | POA: Diagnosis not present

## 2021-09-10 DIAGNOSIS — Z7901 Long term (current) use of anticoagulants: Secondary | ICD-10-CM | POA: Diagnosis not present

## 2021-09-10 DIAGNOSIS — G2581 Restless legs syndrome: Secondary | ICD-10-CM | POA: Diagnosis not present

## 2021-09-10 DIAGNOSIS — F32A Depression, unspecified: Secondary | ICD-10-CM | POA: Diagnosis not present

## 2021-09-10 DIAGNOSIS — K219 Gastro-esophageal reflux disease without esophagitis: Secondary | ICD-10-CM | POA: Diagnosis not present

## 2021-09-10 DIAGNOSIS — Z9989 Dependence on other enabling machines and devices: Secondary | ICD-10-CM | POA: Diagnosis not present

## 2021-09-10 DIAGNOSIS — Z886 Allergy status to analgesic agent status: Secondary | ICD-10-CM | POA: Diagnosis not present

## 2021-09-10 DIAGNOSIS — F419 Anxiety disorder, unspecified: Secondary | ICD-10-CM | POA: Diagnosis not present

## 2021-09-10 DIAGNOSIS — I63512 Cerebral infarction due to unspecified occlusion or stenosis of left middle cerebral artery: Secondary | ICD-10-CM | POA: Diagnosis not present

## 2021-09-10 DIAGNOSIS — I4819 Other persistent atrial fibrillation: Secondary | ICD-10-CM | POA: Diagnosis not present

## 2021-09-10 DIAGNOSIS — E669 Obesity, unspecified: Secondary | ICD-10-CM | POA: Diagnosis not present

## 2021-09-10 DIAGNOSIS — Z7984 Long term (current) use of oral hypoglycemic drugs: Secondary | ICD-10-CM | POA: Diagnosis not present

## 2021-09-10 DIAGNOSIS — I081 Rheumatic disorders of both mitral and tricuspid valves: Secondary | ICD-10-CM | POA: Diagnosis not present

## 2021-09-10 DIAGNOSIS — F411 Generalized anxiety disorder: Secondary | ICD-10-CM | POA: Diagnosis not present

## 2021-09-10 DIAGNOSIS — I4891 Unspecified atrial fibrillation: Secondary | ICD-10-CM | POA: Diagnosis not present

## 2021-09-10 DIAGNOSIS — Z9114 Patient's other noncompliance with medication regimen: Secondary | ICD-10-CM | POA: Diagnosis not present

## 2021-09-10 DIAGNOSIS — R29818 Other symptoms and signs involving the nervous system: Secondary | ICD-10-CM | POA: Diagnosis not present

## 2021-09-10 DIAGNOSIS — R0902 Hypoxemia: Secondary | ICD-10-CM | POA: Diagnosis not present

## 2021-09-10 DIAGNOSIS — K589 Irritable bowel syndrome without diarrhea: Secondary | ICD-10-CM | POA: Diagnosis not present

## 2021-09-10 DIAGNOSIS — M109 Gout, unspecified: Secondary | ICD-10-CM | POA: Diagnosis not present

## 2021-09-10 DIAGNOSIS — R55 Syncope and collapse: Secondary | ICD-10-CM | POA: Diagnosis not present

## 2021-09-10 DIAGNOSIS — Z8673 Personal history of transient ischemic attack (TIA), and cerebral infarction without residual deficits: Secondary | ICD-10-CM | POA: Diagnosis not present

## 2021-09-10 DIAGNOSIS — R0989 Other specified symptoms and signs involving the circulatory and respiratory systems: Secondary | ICD-10-CM | POA: Diagnosis not present

## 2021-09-10 DIAGNOSIS — I6781 Acute cerebrovascular insufficiency: Secondary | ICD-10-CM | POA: Diagnosis not present

## 2021-09-10 DIAGNOSIS — Z6833 Body mass index (BMI) 33.0-33.9, adult: Secondary | ICD-10-CM | POA: Diagnosis not present

## 2021-09-17 DIAGNOSIS — J690 Pneumonitis due to inhalation of food and vomit: Secondary | ICD-10-CM | POA: Insufficient documentation

## 2021-09-17 DIAGNOSIS — B338 Other specified viral diseases: Secondary | ICD-10-CM | POA: Insufficient documentation

## 2021-09-18 DIAGNOSIS — Z86010 Personal history of colonic polyps: Secondary | ICD-10-CM | POA: Diagnosis not present

## 2021-09-18 DIAGNOSIS — E1136 Type 2 diabetes mellitus with diabetic cataract: Secondary | ICD-10-CM | POA: Diagnosis not present

## 2021-09-18 DIAGNOSIS — G2581 Restless legs syndrome: Secondary | ICD-10-CM | POA: Diagnosis not present

## 2021-09-18 DIAGNOSIS — M069 Rheumatoid arthritis, unspecified: Secondary | ICD-10-CM | POA: Diagnosis not present

## 2021-09-18 DIAGNOSIS — M5137 Other intervertebral disc degeneration, lumbosacral region: Secondary | ICD-10-CM | POA: Diagnosis not present

## 2021-09-18 DIAGNOSIS — M109 Gout, unspecified: Secondary | ICD-10-CM | POA: Diagnosis not present

## 2021-09-18 DIAGNOSIS — I10 Essential (primary) hypertension: Secondary | ICD-10-CM | POA: Diagnosis not present

## 2021-09-18 DIAGNOSIS — Z7982 Long term (current) use of aspirin: Secondary | ICD-10-CM | POA: Diagnosis not present

## 2021-09-18 DIAGNOSIS — M549 Dorsalgia, unspecified: Secondary | ICD-10-CM | POA: Diagnosis not present

## 2021-09-18 DIAGNOSIS — Z7984 Long term (current) use of oral hypoglycemic drugs: Secondary | ICD-10-CM | POA: Diagnosis not present

## 2021-09-18 DIAGNOSIS — Z8616 Personal history of COVID-19: Secondary | ICD-10-CM | POA: Diagnosis not present

## 2021-09-18 DIAGNOSIS — Z7901 Long term (current) use of anticoagulants: Secondary | ICD-10-CM | POA: Diagnosis not present

## 2021-09-18 DIAGNOSIS — G4733 Obstructive sleep apnea (adult) (pediatric): Secondary | ICD-10-CM | POA: Diagnosis not present

## 2021-09-18 DIAGNOSIS — E669 Obesity, unspecified: Secondary | ICD-10-CM | POA: Diagnosis not present

## 2021-09-18 DIAGNOSIS — K219 Gastro-esophageal reflux disease without esophagitis: Secondary | ICD-10-CM | POA: Diagnosis not present

## 2021-09-18 DIAGNOSIS — Z8701 Personal history of pneumonia (recurrent): Secondary | ICD-10-CM | POA: Diagnosis not present

## 2021-09-18 DIAGNOSIS — Z87442 Personal history of urinary calculi: Secondary | ICD-10-CM | POA: Diagnosis not present

## 2021-09-18 DIAGNOSIS — E559 Vitamin D deficiency, unspecified: Secondary | ICD-10-CM | POA: Diagnosis not present

## 2021-09-18 DIAGNOSIS — K589 Irritable bowel syndrome without diarrhea: Secondary | ICD-10-CM | POA: Diagnosis not present

## 2021-09-18 DIAGNOSIS — F32A Depression, unspecified: Secondary | ICD-10-CM | POA: Diagnosis not present

## 2021-09-18 DIAGNOSIS — I4891 Unspecified atrial fibrillation: Secondary | ICD-10-CM | POA: Diagnosis not present

## 2021-09-18 DIAGNOSIS — Z6833 Body mass index (BMI) 33.0-33.9, adult: Secondary | ICD-10-CM | POA: Diagnosis not present

## 2021-09-18 DIAGNOSIS — F419 Anxiety disorder, unspecified: Secondary | ICD-10-CM | POA: Diagnosis not present

## 2021-09-18 DIAGNOSIS — D509 Iron deficiency anemia, unspecified: Secondary | ICD-10-CM | POA: Diagnosis not present

## 2021-09-29 DIAGNOSIS — G4733 Obstructive sleep apnea (adult) (pediatric): Secondary | ICD-10-CM | POA: Diagnosis not present

## 2021-10-01 DIAGNOSIS — Z8709 Personal history of other diseases of the respiratory system: Secondary | ICD-10-CM | POA: Diagnosis not present

## 2021-10-01 DIAGNOSIS — Z79899 Other long term (current) drug therapy: Secondary | ICD-10-CM | POA: Diagnosis not present

## 2021-10-01 DIAGNOSIS — G2581 Restless legs syndrome: Secondary | ICD-10-CM | POA: Diagnosis not present

## 2021-10-01 DIAGNOSIS — I10 Essential (primary) hypertension: Secondary | ICD-10-CM | POA: Diagnosis not present

## 2021-10-01 DIAGNOSIS — Z86010 Personal history of colonic polyps: Secondary | ICD-10-CM | POA: Diagnosis not present

## 2021-10-01 DIAGNOSIS — M5137 Other intervertebral disc degeneration, lumbosacral region: Secondary | ICD-10-CM | POA: Diagnosis not present

## 2021-10-01 DIAGNOSIS — E559 Vitamin D deficiency, unspecified: Secondary | ICD-10-CM | POA: Diagnosis not present

## 2021-10-01 DIAGNOSIS — D509 Iron deficiency anemia, unspecified: Secondary | ICD-10-CM | POA: Diagnosis not present

## 2021-10-01 DIAGNOSIS — I498 Other specified cardiac arrhythmias: Secondary | ICD-10-CM | POA: Diagnosis not present

## 2021-10-01 DIAGNOSIS — K219 Gastro-esophageal reflux disease without esophagitis: Secondary | ICD-10-CM | POA: Diagnosis not present

## 2021-10-01 DIAGNOSIS — F419 Anxiety disorder, unspecified: Secondary | ICD-10-CM | POA: Diagnosis not present

## 2021-10-01 DIAGNOSIS — M549 Dorsalgia, unspecified: Secondary | ICD-10-CM | POA: Diagnosis not present

## 2021-10-01 DIAGNOSIS — Z7984 Long term (current) use of oral hypoglycemic drugs: Secondary | ICD-10-CM | POA: Diagnosis not present

## 2021-10-01 DIAGNOSIS — I4819 Other persistent atrial fibrillation: Secondary | ICD-10-CM | POA: Diagnosis not present

## 2021-10-01 DIAGNOSIS — I739 Peripheral vascular disease, unspecified: Secondary | ICD-10-CM | POA: Diagnosis not present

## 2021-10-01 DIAGNOSIS — M069 Rheumatoid arthritis, unspecified: Secondary | ICD-10-CM | POA: Diagnosis not present

## 2021-10-01 DIAGNOSIS — Z7982 Long term (current) use of aspirin: Secondary | ICD-10-CM | POA: Diagnosis not present

## 2021-10-01 DIAGNOSIS — E1136 Type 2 diabetes mellitus with diabetic cataract: Secondary | ICD-10-CM | POA: Diagnosis not present

## 2021-10-01 DIAGNOSIS — E669 Obesity, unspecified: Secondary | ICD-10-CM | POA: Diagnosis not present

## 2021-10-01 DIAGNOSIS — K589 Irritable bowel syndrome without diarrhea: Secondary | ICD-10-CM | POA: Diagnosis not present

## 2021-10-01 DIAGNOSIS — Z87442 Personal history of urinary calculi: Secondary | ICD-10-CM | POA: Diagnosis not present

## 2021-10-01 DIAGNOSIS — M109 Gout, unspecified: Secondary | ICD-10-CM | POA: Diagnosis not present

## 2021-10-01 DIAGNOSIS — F32A Depression, unspecified: Secondary | ICD-10-CM | POA: Diagnosis not present

## 2021-10-01 DIAGNOSIS — Z6833 Body mass index (BMI) 33.0-33.9, adult: Secondary | ICD-10-CM | POA: Diagnosis not present

## 2021-10-01 DIAGNOSIS — Z7901 Long term (current) use of anticoagulants: Secondary | ICD-10-CM | POA: Diagnosis not present

## 2021-10-01 DIAGNOSIS — I480 Paroxysmal atrial fibrillation: Secondary | ICD-10-CM | POA: Diagnosis not present

## 2021-10-01 DIAGNOSIS — Z8616 Personal history of COVID-19: Secondary | ICD-10-CM | POA: Diagnosis not present

## 2021-10-01 DIAGNOSIS — I4891 Unspecified atrial fibrillation: Secondary | ICD-10-CM | POA: Diagnosis not present

## 2021-10-01 DIAGNOSIS — E119 Type 2 diabetes mellitus without complications: Secondary | ICD-10-CM | POA: Diagnosis not present

## 2021-10-01 DIAGNOSIS — G4733 Obstructive sleep apnea (adult) (pediatric): Secondary | ICD-10-CM | POA: Diagnosis not present

## 2021-10-01 DIAGNOSIS — Z9889 Other specified postprocedural states: Secondary | ICD-10-CM | POA: Diagnosis not present

## 2021-10-01 DIAGNOSIS — Z8701 Personal history of pneumonia (recurrent): Secondary | ICD-10-CM | POA: Diagnosis not present

## 2021-10-03 DIAGNOSIS — G4733 Obstructive sleep apnea (adult) (pediatric): Secondary | ICD-10-CM | POA: Diagnosis not present

## 2021-10-03 DIAGNOSIS — M109 Gout, unspecified: Secondary | ICD-10-CM | POA: Diagnosis not present

## 2021-10-03 DIAGNOSIS — E1136 Type 2 diabetes mellitus with diabetic cataract: Secondary | ICD-10-CM | POA: Diagnosis not present

## 2021-10-03 DIAGNOSIS — Z87442 Personal history of urinary calculi: Secondary | ICD-10-CM | POA: Diagnosis not present

## 2021-10-03 DIAGNOSIS — I4891 Unspecified atrial fibrillation: Secondary | ICD-10-CM | POA: Diagnosis not present

## 2021-10-03 DIAGNOSIS — F419 Anxiety disorder, unspecified: Secondary | ICD-10-CM | POA: Diagnosis not present

## 2021-10-03 DIAGNOSIS — I10 Essential (primary) hypertension: Secondary | ICD-10-CM | POA: Diagnosis not present

## 2021-10-03 DIAGNOSIS — Z8701 Personal history of pneumonia (recurrent): Secondary | ICD-10-CM | POA: Diagnosis not present

## 2021-10-03 DIAGNOSIS — Z7901 Long term (current) use of anticoagulants: Secondary | ICD-10-CM | POA: Diagnosis not present

## 2021-10-03 DIAGNOSIS — G2581 Restless legs syndrome: Secondary | ICD-10-CM | POA: Diagnosis not present

## 2021-10-03 DIAGNOSIS — Z86010 Personal history of colonic polyps: Secondary | ICD-10-CM | POA: Diagnosis not present

## 2021-10-03 DIAGNOSIS — D509 Iron deficiency anemia, unspecified: Secondary | ICD-10-CM | POA: Diagnosis not present

## 2021-10-03 DIAGNOSIS — M069 Rheumatoid arthritis, unspecified: Secondary | ICD-10-CM | POA: Diagnosis not present

## 2021-10-03 DIAGNOSIS — M5137 Other intervertebral disc degeneration, lumbosacral region: Secondary | ICD-10-CM | POA: Diagnosis not present

## 2021-10-03 DIAGNOSIS — Z8616 Personal history of COVID-19: Secondary | ICD-10-CM | POA: Diagnosis not present

## 2021-10-03 DIAGNOSIS — E559 Vitamin D deficiency, unspecified: Secondary | ICD-10-CM | POA: Diagnosis not present

## 2021-10-03 DIAGNOSIS — K219 Gastro-esophageal reflux disease without esophagitis: Secondary | ICD-10-CM | POA: Diagnosis not present

## 2021-10-03 DIAGNOSIS — Z7984 Long term (current) use of oral hypoglycemic drugs: Secondary | ICD-10-CM | POA: Diagnosis not present

## 2021-10-03 DIAGNOSIS — E669 Obesity, unspecified: Secondary | ICD-10-CM | POA: Diagnosis not present

## 2021-10-03 DIAGNOSIS — M549 Dorsalgia, unspecified: Secondary | ICD-10-CM | POA: Diagnosis not present

## 2021-10-03 DIAGNOSIS — Z7982 Long term (current) use of aspirin: Secondary | ICD-10-CM | POA: Diagnosis not present

## 2021-10-03 DIAGNOSIS — F32A Depression, unspecified: Secondary | ICD-10-CM | POA: Diagnosis not present

## 2021-10-03 DIAGNOSIS — K589 Irritable bowel syndrome without diarrhea: Secondary | ICD-10-CM | POA: Diagnosis not present

## 2021-10-03 DIAGNOSIS — Z6833 Body mass index (BMI) 33.0-33.9, adult: Secondary | ICD-10-CM | POA: Diagnosis not present

## 2021-10-10 DIAGNOSIS — Z7901 Long term (current) use of anticoagulants: Secondary | ICD-10-CM | POA: Diagnosis not present

## 2021-10-10 DIAGNOSIS — I4819 Other persistent atrial fibrillation: Secondary | ICD-10-CM | POA: Diagnosis not present

## 2021-10-14 DIAGNOSIS — I639 Cerebral infarction, unspecified: Secondary | ICD-10-CM | POA: Diagnosis not present

## 2021-10-14 DIAGNOSIS — J189 Pneumonia, unspecified organism: Secondary | ICD-10-CM | POA: Diagnosis not present

## 2021-10-14 DIAGNOSIS — E1165 Type 2 diabetes mellitus with hyperglycemia: Secondary | ICD-10-CM | POA: Diagnosis not present

## 2021-10-14 DIAGNOSIS — I4891 Unspecified atrial fibrillation: Secondary | ICD-10-CM | POA: Diagnosis not present

## 2021-10-14 DIAGNOSIS — R0602 Shortness of breath: Secondary | ICD-10-CM | POA: Diagnosis not present

## 2021-10-14 DIAGNOSIS — J984 Other disorders of lung: Secondary | ICD-10-CM | POA: Diagnosis not present

## 2021-10-14 DIAGNOSIS — Z09 Encounter for follow-up examination after completed treatment for conditions other than malignant neoplasm: Secondary | ICD-10-CM | POA: Diagnosis not present

## 2021-10-14 DIAGNOSIS — I272 Pulmonary hypertension, unspecified: Secondary | ICD-10-CM | POA: Diagnosis not present

## 2021-10-15 DIAGNOSIS — J189 Pneumonia, unspecified organism: Secondary | ICD-10-CM | POA: Diagnosis not present

## 2021-10-15 DIAGNOSIS — R059 Cough, unspecified: Secondary | ICD-10-CM | POA: Diagnosis not present

## 2021-10-15 DIAGNOSIS — J811 Chronic pulmonary edema: Secondary | ICD-10-CM | POA: Diagnosis not present

## 2021-10-15 DIAGNOSIS — R0600 Dyspnea, unspecified: Secondary | ICD-10-CM | POA: Diagnosis not present

## 2021-10-15 DIAGNOSIS — J9811 Atelectasis: Secondary | ICD-10-CM | POA: Diagnosis not present

## 2021-10-15 DIAGNOSIS — J90 Pleural effusion, not elsewhere classified: Secondary | ICD-10-CM | POA: Diagnosis not present

## 2021-10-16 DIAGNOSIS — H02831 Dermatochalasis of right upper eyelid: Secondary | ICD-10-CM | POA: Diagnosis not present

## 2021-10-16 DIAGNOSIS — H02834 Dermatochalasis of left upper eyelid: Secondary | ICD-10-CM | POA: Diagnosis not present

## 2021-10-21 DIAGNOSIS — I4819 Other persistent atrial fibrillation: Secondary | ICD-10-CM | POA: Diagnosis not present

## 2021-10-21 DIAGNOSIS — Z7901 Long term (current) use of anticoagulants: Secondary | ICD-10-CM | POA: Diagnosis not present

## 2021-11-07 DIAGNOSIS — G459 Transient cerebral ischemic attack, unspecified: Secondary | ICD-10-CM | POA: Insufficient documentation

## 2021-11-12 DIAGNOSIS — J9611 Chronic respiratory failure with hypoxia: Secondary | ICD-10-CM | POA: Insufficient documentation

## 2021-11-12 DIAGNOSIS — Z5181 Encounter for therapeutic drug level monitoring: Secondary | ICD-10-CM | POA: Insufficient documentation

## 2021-11-12 DIAGNOSIS — J189 Pneumonia, unspecified organism: Secondary | ICD-10-CM | POA: Insufficient documentation

## 2021-11-13 DIAGNOSIS — E162 Hypoglycemia, unspecified: Secondary | ICD-10-CM | POA: Insufficient documentation

## 2022-02-19 DIAGNOSIS — K7469 Other cirrhosis of liver: Secondary | ICD-10-CM | POA: Insufficient documentation

## 2022-04-01 DIAGNOSIS — I5033 Acute on chronic diastolic (congestive) heart failure: Secondary | ICD-10-CM | POA: Insufficient documentation

## 2022-04-09 DIAGNOSIS — I509 Heart failure, unspecified: Secondary | ICD-10-CM | POA: Insufficient documentation

## 2022-12-01 DIAGNOSIS — B351 Tinea unguium: Secondary | ICD-10-CM | POA: Insufficient documentation

## 2023-01-05 DIAGNOSIS — R262 Difficulty in walking, not elsewhere classified: Secondary | ICD-10-CM | POA: Insufficient documentation

## 2023-01-05 DIAGNOSIS — G8929 Other chronic pain: Secondary | ICD-10-CM | POA: Insufficient documentation

## 2023-01-05 DIAGNOSIS — R29898 Other symptoms and signs involving the musculoskeletal system: Secondary | ICD-10-CM | POA: Insufficient documentation

## 2023-01-05 DIAGNOSIS — M25551 Pain in right hip: Secondary | ICD-10-CM | POA: Insufficient documentation

## 2023-02-02 DIAGNOSIS — M10071 Idiopathic gout, right ankle and foot: Secondary | ICD-10-CM | POA: Insufficient documentation

## 2023-02-22 DIAGNOSIS — M5416 Radiculopathy, lumbar region: Secondary | ICD-10-CM | POA: Insufficient documentation

## 2023-03-31 ENCOUNTER — Encounter: Payer: Self-pay | Admitting: Internal Medicine

## 2023-03-31 ENCOUNTER — Ambulatory Visit (INDEPENDENT_AMBULATORY_CARE_PROVIDER_SITE_OTHER): Payer: PPO | Admitting: Internal Medicine

## 2023-03-31 VITALS — BP 114/62 | HR 61 | Temp 98.2°F | Ht 66.25 in | Wt 217.8 lb

## 2023-03-31 DIAGNOSIS — E1165 Type 2 diabetes mellitus with hyperglycemia: Secondary | ICD-10-CM | POA: Diagnosis not present

## 2023-03-31 DIAGNOSIS — I10 Essential (primary) hypertension: Secondary | ICD-10-CM | POA: Diagnosis not present

## 2023-03-31 DIAGNOSIS — J984 Other disorders of lung: Secondary | ICD-10-CM

## 2023-03-31 DIAGNOSIS — Z794 Long term (current) use of insulin: Secondary | ICD-10-CM | POA: Diagnosis not present

## 2023-03-31 DIAGNOSIS — I509 Heart failure, unspecified: Secondary | ICD-10-CM

## 2023-03-31 DIAGNOSIS — M25531 Pain in right wrist: Secondary | ICD-10-CM | POA: Insufficient documentation

## 2023-03-31 DIAGNOSIS — Z7984 Long term (current) use of oral hypoglycemic drugs: Secondary | ICD-10-CM | POA: Diagnosis not present

## 2023-03-31 DIAGNOSIS — L249 Irritant contact dermatitis, unspecified cause: Secondary | ICD-10-CM

## 2023-03-31 DIAGNOSIS — Z7409 Other reduced mobility: Secondary | ICD-10-CM | POA: Insufficient documentation

## 2023-03-31 DIAGNOSIS — G4733 Obstructive sleep apnea (adult) (pediatric): Secondary | ICD-10-CM

## 2023-03-31 DIAGNOSIS — I480 Paroxysmal atrial fibrillation: Secondary | ICD-10-CM

## 2023-03-31 LAB — COMPREHENSIVE METABOLIC PANEL
ALT: 13 U/L (ref 0–35)
AST: 14 U/L (ref 0–37)
Albumin: 4.3 g/dL (ref 3.5–5.2)
Alkaline Phosphatase: 112 U/L (ref 39–117)
BUN: 28 mg/dL — ABNORMAL HIGH (ref 6–23)
CO2: 31 mEq/L (ref 19–32)
Calcium: 9.5 mg/dL (ref 8.4–10.5)
Chloride: 98 mEq/L (ref 96–112)
Creatinine, Ser: 1.03 mg/dL (ref 0.40–1.20)
GFR: 52.86 mL/min — ABNORMAL LOW (ref >60.00)
Glucose, Bld: 155 mg/dL — ABNORMAL HIGH (ref 70–99)
Potassium: 5 mEq/L (ref 3.5–5.1)
Sodium: 137 mEq/L (ref 135–145)
Total Bilirubin: 1.3 mg/dL — ABNORMAL HIGH (ref 0.2–1.2)
Total Protein: 7 g/dL (ref 6.0–8.3)

## 2023-03-31 LAB — LIPID PANEL
Cholesterol: 198 mg/dL (ref 0–200)
HDL: 57.3 mg/dL (ref >39.00)
LDL Cholesterol: 115 mg/dL — ABNORMAL HIGH (ref 0–99)
NonHDL: 141.11
Total CHOL/HDL Ratio: 3
Triglycerides: 130 mg/dL (ref 0.0–149.0)
VLDL: 26 mg/dL (ref 0.0–40.0)

## 2023-03-31 LAB — MICROALBUMIN / CREATININE URINE RATIO
Creatinine,U: 69.4 mg/dL
Microalb Creat Ratio: 2.6 mg/g (ref 0.0–30.0)
Microalb, Ur: 1.8 mg/dL (ref 0.0–1.9)

## 2023-03-31 LAB — POCT GLYCOSYLATED HEMOGLOBIN (HGB A1C): Hemoglobin A1C: 9 % — AB (ref 4.0–5.6)

## 2023-03-31 MED ORDER — GLOBAL EASY GLIDE PEN NEEDLES 32G X 4 MM MISC
1.0000 | 3 refills | Status: DC
Start: 2023-03-31 — End: 2023-04-07

## 2023-03-31 NOTE — Assessment & Plan Note (Signed)
Tylenol as needed for pain  Follow up with ortho as scheduled

## 2023-03-31 NOTE — Assessment & Plan Note (Signed)
Continue farxiga 10mg , Tresiba 16 units, and Glipizide 10mg  BID  Continue to check BG daily. If BG < 150 x 3 days consistently then decrease insulin by 3 units and notify provider  Obtaining microalbumin and CMP

## 2023-03-31 NOTE — Assessment & Plan Note (Signed)
Continue CPAP.  

## 2023-03-31 NOTE — Assessment & Plan Note (Signed)
Stable at this time.  Continue current drug regimen  Continue follow up with cardiology

## 2023-03-31 NOTE — Assessment & Plan Note (Signed)
Stable at this time.  Continue daily weights  Continue salt restriction  Continue fluid pill Continue HF PT  

## 2023-03-31 NOTE — Patient Instructions (Addendum)
If fasting blood sugar is less than 150 for 3 days in a row, then decrease insulin by 3 units and notify provider.   Use Cortisone twice a day

## 2023-03-31 NOTE — Progress Notes (Signed)
Subjective:     Patient ID: Tina Baker, female    DOB: 10-20-1946, 77 y.o.   MRN: [id number]  Chief Complaint  Patient presents with   Annual Exam   Hemoglobin A1c Screening    Discuss right arm pain   Rash    Patient presents as transferring care with provider from [facility] to [place] [facility]. This is not provider's first encounter with patient.   DIABETES MELLITUS: Patient presents for the medical management of diabetes.  Condition is improving. She was recently approved for farxiga, she has just started taking it 1 week ago.  Current medication regimen: Farxiga 10mg  PO daily,  Tresiba 16 units, glipizide 10mg  BID Last A1C in care everywhere was 11.2% (12/16/22)  Health maintenance reviewed: obtaining CMP and urine albumin  Lab Results  Component Value Date   HGBA1C 9.0 (A) 03/31/2023     HYPERTENSION: Patient presents for the medical management of hypertension.  Current medication regimen: Metoprolol 50mg  BID, Cardia XT Well controlled currently.  Denies headache, dizziness, CP, SHOB.  BP Readings from Last 3 Encounters:  03/31/23 114/62  12/26/18 (!) 145/83  10/21/18 (!) 151/78    AFIB: Is on long term anticoagulant Coumadin ( managed by cardiology)  Rate is well controlled with Tikosyn, Metoprolol and Cardia XT  No palpitations.   CHF: Is currently taking Spironolactone for fluid control  Is on 3L [place] Does daily weight checks Limits salt intake  She goes regularly for CHF PT Denies worsening SHOB. No CP, edema.   RESTRICTIVE LUNG DISEASE  She is currently on 3L [place] continuously  She is managed by Dr. Candie Echevaria, next appt is in August  OSA:  Compliant with CPAP nightly.   RASH: Patient complains of a rash to back of her neck onset couple weeks ago. She states that is where her CPAP mask straps wrap around her head and neck. (+)itching  Has tried Cortisone with some relief. She went to the dermatologist and was given a sample of  unknown steroid cream which did not help.   WRIST PAIN: Patient c/o R. Wrist pain ongoing x couple weeks. No injury. Associated swelling. Does have hx of arthritis and RA. She is scheduled to see Ortho next week. She is also contacting Rheumatology office to schedule an appt.   Health Maintenance Due  Topic Date Due   Diabetic kidney evaluation - Urine ACR  Never done   Diabetic kidney evaluation - eGFR measurement  02/15/2010    Past Medical History:  Diagnosis Date   Arthritis    Atrial fibrillation (HCC)    Diabetes mellitus    Hyperlipidemia    Hypertension     Past Surgical History:  Procedure Laterality Date   ABDOMINAL HYSTERECTOMY  2001   BUNIONECTOMY Bilateral 1979   GALLBLADDER SURGERY     SPINE SURGERY  2010   protruding disc     Family History  Problem Relation Age of Onset   Cancer Mother 69       leukemia    Heart disease Father 74       heart attack    Heart disease Sister     Social History   Socioeconomic History   Marital status: Divorced    Spouse name: Not on file   Number of children: Not on file   Years of education: Not on file   Highest education level: Not on file  Occupational History   Not on file  Tobacco Use  Smoking status: Never   Smokeless tobacco: Never  Substance and Sexual Activity   Alcohol use: No   Drug use: No   Sexual activity: Not on file  Other Topics Concern   Not on file  Social History Narrative   Not on file   Social Determinants of Health   Financial Resource Strain: Not on file  Food Insecurity: Not on file  Transportation Needs: Not on file  Physical Activity: Not on file  Stress: Not on file  Social Connections: Not on file  Intimate Partner Violence: Not on file    Outpatient Medications Prior to Visit  Medication Sig Dispense Refill   acetaminophen (TYLENOL) 325 MG tablet Take 650 mg by mouth every 4 (four) hours as needed for moderate pain.     ALPRAZolam (XANAX) 0.5 MG tablet Take 0.5 mg  by mouth at bedtime as needed.       aspirin 81 MG tablet Take 81 mg by mouth daily.       B Complex Vitamins (VITAMIN B-COMPLEX) TABS Take 1 tablet by mouth daily.     CARTIA XT 300 MG 24 hr capsule Take 1 capsule by mouth daily.  11   Chlorpheniramine-DM 4-30 MG TABS Take 1 tablet by mouth as needed.     cholecalciferol (VITAMIN D3) 25 MCG (1000 UT) tablet Take 2,000 Units by mouth daily.     dapagliflozin propanediol (FARXIGA) 10 MG TABS tablet Take 1 tablet by mouth daily.     dofetilide (TIKOSYN) 250 MCG capsule Take 1 capsule by mouth 2 (two) times daily.     furosemide (LASIX) 20 MG tablet Take 60 mg by mouth daily.     glipiZIDE (GLUCOTROL) 10 MG tablet TAKE 1 TABLET BY MOUTH TWICE DAILY BEFORE MEALS     glucose blood (RELION PRIME TEST) test strip 1 each by Other route See admin instructions.     insulin degludec (TRESIBA) 100 UNIT/ML FlexTouch Pen Inject 16 Units into the skin at bedtime.     lansoprazole (PREVACID) 30 MG capsule Take 30 mg by mouth daily.       metoprolol tartrate (LOPRESSOR) 50 MG tablet Take 50 mg by mouth 2 (two) times daily.     mupirocin nasal ointment (BACTROBAN) 2 % Place 1 application  into the nose as needed. Use one-half of tube in each nostril twice daily for five (5) days. After application, press sides of nose together and gently massage.     ondansetron (ZOFRAN-ODT) 4 MG disintegrating tablet Place 1 tablet under your tongue every 8 hours as needed  0   Pramipexole Dihydrochloride 1.5 MG TB24 Take 1.5 mg by mouth.     spironolactone (ALDACTONE) 25 MG tablet Take 0.5 tablets by mouth daily.     warfarin (COUMADIN) 5 MG tablet Take by mouth.     Insulin Pen Needle (GLOBAL EASY GLIDE PEN NEEDLES) 32G X 4 MM MISC Inject 1 each into the skin See admin instructions.     metoprolol succinate (TOPROL-XL) 25 MG 24 hr tablet Take by mouth daily.      zolpidem (AMBIEN) 10 MG tablet TAKE 1 TABLET BY MOUTH AT BEDTIME AS NEEDED FOR INSOMNIA (Patient not taking:  Reported on 03/31/2023)     flecainide (TAMBOCOR) 50 MG tablet Take 50 mg by mouth 2 (two) times daily. (Patient not taking: Reported on 03/31/2023)     potassium chloride (K-DUR) 10 MEQ tablet Take 1 tablet by mouth daily. (Patient not taking: Reported on 03/31/2023)  RABEprazole (ACIPHEX) 20 MG tablet Take 20 mg by mouth daily. (Patient not taking: Reported on 03/31/2023)     sertraline (ZOLOFT) 100 MG tablet Take 50 mg by mouth daily.  (Patient not taking: Reported on 03/31/2023)     sucralfate (CARAFATE) 1 g tablet Take 1 g by mouth. (Patient not taking: Reported on 03/31/2023)     No facility-administered medications prior to visit.    Allergies  Allergen Reactions   Statins Other (See Comments)    myalgia   Valdecoxib Rash         Review of Systems  Constitutional: Negative.   HENT: Negative.    Eyes: Negative.   Respiratory: Negative.    Cardiovascular: Negative.   Gastrointestinal: Negative.   Genitourinary: Negative.   Musculoskeletal:  Positive for joint pain.  Skin:  Positive for rash.  Neurological: Negative.   Endo/Heme/Allergies: Negative.   Psychiatric/Behavioral: Negative.         Objective:    Physical Exam Constitutional:      General: She is not in acute distress. Cardiovascular:     Rate and Rhythm: Normal rate and regular rhythm.     Pulses: Normal pulses.     Heart sounds: Normal heart sounds. No murmur heard. Pulmonary:     Effort: Pulmonary effort is normal.     Breath sounds: Normal breath sounds. No wheezing, rhonchi or rales.  Musculoskeletal:        General: Swelling and tenderness present. No deformity or signs of injury.     Comments: R. wrist  Skin:    General: Skin is warm and dry.     Findings: Rash present.     Comments: Raised erythematous excoriated rash to posterior neck  Neurological:     General: No focal deficit present.     Mental Status: She is alert and oriented to person, place, and time.  Psychiatric:        Mood and  Affect: Mood normal.        Behavior: Behavior normal.     BP 114/62   Pulse 61   Temp 98.2 F (36.8 C) (Temporal)   Ht 5' 6.25" (1.683 m)   Wt 217 lb 12.8 oz (98.8 kg)   SpO2 91%   BMI 34.89 kg/m  Wt Readings from Last 3 Encounters:  03/31/23 217 lb 12.8 oz (98.8 kg)  08/02/17 229 lb (103.9 kg)  01/09/15 219 lb (99.3 kg)    Results for orders placed or performed in visit on 03/31/23  POCT glycosylated hemoglobin (Hb A1C)  Result Value Ref Range   Hemoglobin A1C 9.0 (A) 4.0 - 5.6 %   HbA1c POC (<> result, manual entry)     HbA1c, POC (prediabetic range)     HbA1c, POC (controlled diabetic range)         Assessment & Plan:   Problem List Items Addressed This Visit       Cardiovascular and Mediastinum   Benign essential hypertension    Well controlled  Continue current regimen  Continue to check BP at home, notify PCP if > 140/90      Relevant Medications   dofetilide (TIKOSYN) 250 MCG capsule   metoprolol tartrate (LOPRESSOR) 50 MG tablet   spironolactone (ALDACTONE) 25 MG tablet   Other Relevant Orders   Comprehensive metabolic panel   Paroxysmal atrial fibrillation (HCC)    Stable at this time.  Continue current drug regimen  Continue follow up with cardiology       Relevant  Medications   dofetilide (TIKOSYN) 250 MCG capsule   metoprolol tartrate (LOPRESSOR) 50 MG tablet   spironolactone (ALDACTONE) 25 MG tablet   Heart failure (HCC)    Stable at this time.  Continue daily weights  Continue salt restriction  Continue fluid pill Continue HF PT       Relevant Medications   dofetilide (TIKOSYN) 250 MCG capsule   metoprolol tartrate (LOPRESSOR) 50 MG tablet   spironolactone (ALDACTONE) 25 MG tablet     Respiratory   Obstructive sleep apnea (adult) (pediatric) (Chronic)    Continue CPAP      Restrictive lung disease     Endocrine   Type 2 diabetes mellitus, without long-term current use of insulin (HCC) - Primary    Continue farxiga 10mg ,  Tresiba 16 units, and Glipizide 10mg  BID  Continue to check BG daily. If BG < 150 x 3 days consistently then decrease insulin by 3 units and notify provider  Obtaining microalbumin and CMP        Relevant Medications   dapagliflozin propanediol (FARXIGA) 10 MG TABS tablet   insulin degludec (TRESIBA) 100 UNIT/ML FlexTouch Pen   Insulin Pen Needle (GLOBAL EASY GLIDE PEN NEEDLES) 32G X 4 MM MISC   Other Relevant Orders   POCT glycosylated hemoglobin (Hb A1C) (Completed)   Comprehensive metabolic panel   Microalbumin / creatinine urine ratio   Lipid panel     Musculoskeletal and Integument   Irritant contact dermatitis    Cortisone 10 BID , then apply vaseline or moisturizer         Other   Arthralgia of right wrist    Tylenol as needed for pain  Follow up with ortho as scheduled        I have discontinued Victorino Dike A. Pinkus's sertraline, sucralfate, flecainide, potassium chloride, metoprolol succinate, and RABEprazole. I am also having her maintain her lansoprazole, ALPRAZolam, aspirin, Pramipexole Dihydrochloride, glipiZIDE, furosemide, zolpidem, mupirocin nasal ointment, ondansetron, Cartia XT, warfarin, cholecalciferol, dofetilide, acetaminophen, Vitamin B-Complex, Chlorpheniramine-DM, dapagliflozin propanediol, ReliOn Prime Test, insulin degludec, metoprolol tartrate, spironolactone, and Global Easy Glide Pen Needles.  Return in about 3 months (around 07/01/2023) for diabetes, HTN, CHF, lung disease .  Meds ordered this encounter  Medications   Insulin Pen Needle (GLOBAL EASY GLIDE PEN NEEDLES) 32G X 4 MM MISC    Sig: Inject 1 each into the skin See admin instructions.    Dispense:  200 each    Refill:  3    Order Specific Question:   Supervising Provider    Answer:   Garnette Gunner [[id number]]    Salvatore Decent, FNP

## 2023-03-31 NOTE — Assessment & Plan Note (Signed)
Stable at this time.  Continue daily weights  Continue salt restriction  Continue fluid pill Continue HF PT

## 2023-03-31 NOTE — Assessment & Plan Note (Signed)
Cortisone 10 BID , then apply vaseline or moisturizer

## 2023-03-31 NOTE — Assessment & Plan Note (Signed)
Well controlled  Continue current regimen  Continue to check BP at home, notify PCP if > 140/90

## 2023-04-06 ENCOUNTER — Encounter: Payer: Self-pay | Admitting: Internal Medicine

## 2023-04-07 ENCOUNTER — Other Ambulatory Visit: Payer: Self-pay | Admitting: Internal Medicine

## 2023-04-07 ENCOUNTER — Telehealth: Payer: Self-pay | Admitting: Internal Medicine

## 2023-04-07 DIAGNOSIS — E1165 Type 2 diabetes mellitus with hyperglycemia: Secondary | ICD-10-CM

## 2023-04-07 MED ORDER — GLOBAL EASY GLIDE PEN NEEDLES 32G X 4 MM MISC
1.0000 | 3 refills | Status: DC
Start: 2023-04-07 — End: 2023-07-02

## 2023-04-07 NOTE — Telephone Encounter (Signed)
Spoke with patient to assist with MyChart set up and patient stated she would call back tomorrow

## 2023-04-07 NOTE — Telephone Encounter (Signed)
Can you call patient and get her set up with Mychart, thanks

## 2023-04-16 ENCOUNTER — Encounter: Payer: Self-pay | Admitting: Internal Medicine

## 2023-04-16 DIAGNOSIS — F411 Generalized anxiety disorder: Secondary | ICD-10-CM

## 2023-04-19 MED ORDER — ALPRAZOLAM 0.5 MG PO TABS
0.5000 mg | ORAL_TABLET | Freq: Every evening | ORAL | 1 refills | Status: DC | PRN
Start: 2023-04-19 — End: 2023-05-10

## 2023-05-10 ENCOUNTER — Ambulatory Visit (INDEPENDENT_AMBULATORY_CARE_PROVIDER_SITE_OTHER): Payer: PPO | Admitting: Internal Medicine

## 2023-05-10 ENCOUNTER — Encounter: Payer: Self-pay | Admitting: Internal Medicine

## 2023-05-10 VITALS — BP 106/68 | HR 64 | Temp 98.3°F | Ht 66.25 in | Wt 218.8 lb

## 2023-05-10 DIAGNOSIS — F411 Generalized anxiety disorder: Secondary | ICD-10-CM | POA: Diagnosis not present

## 2023-05-10 DIAGNOSIS — J00 Acute nasopharyngitis [common cold]: Secondary | ICD-10-CM | POA: Diagnosis not present

## 2023-05-10 MED ORDER — FLUTICASONE PROPIONATE 50 MCG/ACT NA SUSP
2.0000 | Freq: Every day | NASAL | 2 refills | Status: DC
Start: 2023-05-10 — End: 2023-12-03

## 2023-05-10 MED ORDER — ALPRAZOLAM 0.5 MG PO TABS: 0.5000 mg | ORAL_TABLET | Freq: Every evening | ORAL | 0 refills | Status: DC | PRN

## 2023-05-10 NOTE — Progress Notes (Signed)
[facility] [address] [place] [address] Dept: [phone] Dept Fax: [phone]  Acute Care Office Visit  Subjective:   Tina Baker [birth year] 05/10/2023  Chief Complaint  Patient presents with   Nose Problem    Nose running since last week     HPI: Discussed the use of AI scribe software for clinical note transcription with the patient, who gave verbal consent to proceed.  History of Present Illness   The patient presents with a runny nose that started last Friday. They describe a sensation of something being lodged in their nose, prompting them to use a Q-tip for exploration. The runny nose was bilateral and continuous, causing concern as they were scheduled to sing at church. They deny associated symptoms such as congestion, sore throat, fever, or chills. They attempted self-treatment with Coricidin, which provided some relief. They also report a headache localized to the forehead, which they describe as sinus-like. The headache was intermittent and associated with blurred vision and eye pain. They have a history of using Flonase nasal spray for similar symptoms in the past.   Patient needs refill of her Xanax.          The following portions of the patient's history were reviewed and updated as appropriate: past medical history, past surgical history, family history, social history, allergies, medications, and problem list.   Patient Active Problem List   Diagnosis Date Noted   Impaired functional mobility, balance, gait, and endurance 03/31/2023   Irritant contact dermatitis 03/31/2023   Arthralgia of right wrist 03/31/2023   Lumbar radiculopathy 02/22/2023   Acute idiopathic gout of right foot 02/02/2023   Chronic bilateral low back pain without sciatica 01/05/2023   Bilateral hip pain 01/05/2023   Impaired ambulation 01/05/2023   Weakness of both lower extremities 01/05/2023   Tinea unguium  12/01/2022   Heart failure (HCC) 04/09/2022   Acute on chronic heart failure with preserved ejection fraction (HFpEF) (HCC) 04/01/2022   Cryptogenic cirrhosis (HCC) 02/19/2022   Hypoglycemia 11/13/2021   Chronic respiratory failure with hypoxia (HCC) 11/12/2021   CAP (community acquired pneumonia) 11/12/2021   Subtherapeutic anticoagulation 11/12/2021   TIA (transient ischemic attack) 11/07/2021   Aspiration pneumonia (HCC) 09/17/2021   RSV (respiratory syncytial virus infection) 09/17/2021   Senile nuclear sclerosis 03/18/2021   Hypertrophy of bone, right ankle and foot 01/24/2021   Restrictive lung disease 05/17/2020   History of colon polyps 12/11/2019   Hyperbilirubinemia 12/11/2019   Thrombocytopenia (HCC) 12/11/2019   Pulmonary hypertension (HCC) 11/24/2019   Bilateral pleural effusion 10/25/2019   Primary osteoarthritis of left hip 06/14/2019   Trochanteric bursitis of left hip 06/14/2019   Intolerance of continuous positive airway pressure (CPAP) ventilation 05/03/2019   Primary osteoarthritis of left knee 05/01/2019   Primary osteoarthritis of right knee 05/01/2019   Bilateral carpal tunnel syndrome 05/01/2019   Trigger thumb of left hand 05/01/2019   ANA positive 02/23/2019   Rheumatoid factor positive 02/23/2019   GI bleed 02/17/2019   Generalized anxiety disorder 11/27/2018   DNR (do not resuscitate) 11/27/2018   Elevated d-dimer 11/27/2018   Hyponatremia 11/27/2018   Major depressive disorder with current active episode 11/27/2018   Obesity (BMI 30-39.9) 11/27/2018   Essential hypertension 11/27/2018   Acute hypoxemic respiratory failure (HCC) 11/27/2018   Obstructive sleep apnea (adult) (pediatric) 08/12/2018   Acute pulmonary edema (HCC) 07/08/2018   Recurrent major depressive disorder, in partial remission (HCC) 04/13/2018   Varicose veins of left  lower extremity with pain 04/13/2018   Constipation 02/19/2018   Long term (current) use of anticoagulants  12/31/2015   Acromioclavicular joint arthritis 12/31/2015   Subacromial bursitis 12/31/2015   Allergic rhinitis 11/13/2015   Benign essential hypertension 11/13/2015   Chronic tension-type headache, not intractable 11/13/2015   DDD (degenerative disc disease), lumbosacral 11/13/2015   Gastroesophageal reflux disease without esophagitis 11/13/2015   Paroxysmal atrial fibrillation (HCC) 11/13/2015   RLS (restless legs syndrome) 11/13/2015   Vitamin D deficiency 11/13/2015   Insomnia 11/13/2015   Class 1 obesity due to excess calories with serious comorbidity and body mass index (BMI) of 33.0 to 33.9 in adult 11/13/2015   Contracture of tendon sheath 11/13/2015   Spinal stenosis of lumbar region without neurogenic claudication 11/13/2015   Gout, arthritis 11/13/2015   Iron deficiency anemia due to chronic blood loss 11/13/2015   Lumbar herniated disc 11/13/2015   Rotator cuff tendinitis 11/13/2015   Swelling 09/30/2015   Chest discomfort 07/18/2015   Hospital discharge follow-up 07/04/2015   Leukocytosis 06/27/2015   Anemia, unspecified 06/26/2015   Type 2 diabetes mellitus, without long-term current use of insulin (HCC) 06/25/2015   Venous insufficiency, peripheral 01/09/2015   Spondylolisthesis of lumbar region 11/03/2012   Past Medical History:  Diagnosis Date   Arthritis    Atrial fibrillation (HCC)    Diabetes mellitus    Hyperlipidemia    Hypertension    Past Surgical History:  Procedure Laterality Date   ABDOMINAL HYSTERECTOMY  2001   BUNIONECTOMY Bilateral 1979   GALLBLADDER SURGERY     SPINE SURGERY  2010   protruding disc    Family History  Problem Relation Age of Onset   Cancer Mother 73       leukemia    Heart disease Father 71       heart attack    Heart disease Sister    Outpatient Medications Prior to Visit  Medication Sig Dispense Refill   acetaminophen (TYLENOL) 325 MG tablet Take 650 mg by mouth every 4 (four) hours as needed for moderate pain.      aspirin 81 MG tablet Take 81 mg by mouth daily.       B Complex Vitamins (VITAMIN B-COMPLEX) TABS Take 1 tablet by mouth daily.     CARTIA XT 300 MG 24 hr capsule Take 1 capsule by mouth daily.  11   Chlorpheniramine-DM 4-30 MG TABS Take 1 tablet by mouth as needed.     cholecalciferol (VITAMIN D3) 25 MCG (1000 UT) tablet Take 2,000 Units by mouth daily.     dapagliflozin propanediol (FARXIGA) 10 MG TABS tablet Take 1 tablet by mouth daily.     dofetilide (TIKOSYN) 250 MCG capsule Take 1 capsule by mouth 2 (two) times daily.     furosemide (LASIX) 20 MG tablet Take 60 mg by mouth daily.     glipiZIDE (GLUCOTROL) 10 MG tablet TAKE 1 TABLET BY MOUTH TWICE DAILY BEFORE MEALS     glucose blood (RELION PRIME TEST) test strip 1 each by Other route See admin instructions.     insulin degludec (TRESIBA) 100 UNIT/ML FlexTouch Pen Inject 16 Units into the skin at bedtime.     Insulin Pen Needle (GLOBAL EASY GLIDE PEN NEEDLES) 32G X 4 MM MISC Inject 1 each into the skin See admin instructions. 200 each 3   lansoprazole (PREVACID) 30 MG capsule Take 30 mg by mouth daily.       metoprolol tartrate (LOPRESSOR) 50 MG tablet Take 50  mg by mouth 2 (two) times daily.     mupirocin nasal ointment (BACTROBAN) 2 % Place 1 application  into the nose as needed. Use one-half of tube in each nostril twice daily for five (5) days. After application, press sides of nose together and gently massage.     ondansetron (ZOFRAN-ODT) 4 MG disintegrating tablet Place 1 tablet under your tongue every 8 hours as needed  0   Pramipexole Dihydrochloride 1.5 MG TB24 Take 1.5 mg by mouth.     warfarin (COUMADIN) 5 MG tablet Take by mouth.     ALPRAZolam (XANAX) 0.5 MG tablet Take 1 tablet (0.5 mg total) by mouth at bedtime as needed. 30 tablet 1   spironolactone (ALDACTONE) 25 MG tablet Take 0.5 tablets by mouth daily. (Patient not taking: Reported on 05/10/2023)     zolpidem (AMBIEN) 10 MG tablet  (Patient not taking: Reported on  05/10/2023)     No facility-administered medications prior to visit.   Allergies  Allergen Reactions   Statins Other (See Comments)    myalgia   Valdecoxib Rash          ROS: A complete ROS was performed with pertinent positives/negatives noted in the HPI. The remainder of the ROS are negative.    Objective:   Today's Vitals   05/10/23 1523  BP: 106/68  Pulse: 64  Temp: 98.3 F (36.8 C)  TempSrc: Temporal  SpO2: 95%  Weight: 218 lb 12.8 oz (99.2 kg)  Height: 5' 6.25" (1.683 m)       GENERAL: Well-appearing, in NAD. Well nourished.  SKIN: Pink, warm and dry. No rash, lesion, ulceration, or ecchymoses.  HEENT:    HEAD: Normocephalic, non-traumatic.  EYES: Conjunctive pink without exudate. EARS: External ear w/o redness, swelling, masses, or lesions. EAC clear. TM's intact, translucent w/o bulging, appropriate landmarks visualized.  NOSE: Septum midline w/o deformity. Nares patent, mucosa pink and medial turbinates inflamed with clear drainage. Left medial turbinate with dried blood.  No sinus tenderness.  THROAT: Uvula midline. Oropharynx clear. Tonsils non-inflamed w/o exudate. Mucus membranes pink and moist.  RESPIRATORY: Respirations even and non-labored.  NEUROLOGIC: Steady, even gait.  PSYCH/MENTAL STATUS: Alert, oriented x 3. Cooperative, appropriate mood and affect.    No results found for any visits on 05/10/23.    Assessment & Plan:  Assessment and Plan    Rhinitis: Possible etiologies include allergies or age-related changes in mucus membranes. -Start Flonase nasal spray, one spray each nostril twice a day. -Consider adding Astepro if no improvement with Flonase alone. -Continue Coricidin as needed for symptomatic relief.  Anxiety: Stable on Xanax, taken at night. -Send three-month supply of Xanax to the pharmacy.      Meds ordered this encounter  Medications   fluticasone (FLONASE) 50 MCG/ACT nasal spray    Sig: Place 2 sprays into both nostrils  daily.    Dispense:  16 g    Refill:  2   ALPRAZolam (XANAX) 0.5 MG tablet    Sig: Take 1 tablet (0.5 mg total) by mouth at bedtime as needed.    Dispense:  90 tablet    Refill:  0   Lab Orders  No laboratory test(s) ordered today   No images are attached to the encounter or orders placed in the encounter.  Return for Scheduled Routine Office Visits and as needed.   Of note, portions of this note may have been created with voice recognition software [employer]ic[employer], medical). While this note has been edited for accuracy,  occasional wrong-word or 'sound-a-like' substitutions may have occurred due to the inherent limitations of voice recognition software.   Salvatore Decent, FNP

## 2023-05-14 ENCOUNTER — Encounter: Payer: Self-pay | Admitting: Internal Medicine

## 2023-05-26 ENCOUNTER — Other Ambulatory Visit: Payer: Self-pay

## 2023-05-26 NOTE — Progress Notes (Unsigned)
Patient requesting Rx refill.  Was filled by historical provider

## 2023-05-27 ENCOUNTER — Encounter: Payer: Self-pay | Admitting: Internal Medicine

## 2023-05-27 DIAGNOSIS — Z1231 Encounter for screening mammogram for malignant neoplasm of breast: Secondary | ICD-10-CM

## 2023-05-27 MED ORDER — GLIPIZIDE 10 MG PO TABS: 10.0000 mg | ORAL_TABLET | Freq: Two times a day (BID) | ORAL | 1 refills | Status: DC

## 2023-06-02 ENCOUNTER — Encounter (INDEPENDENT_AMBULATORY_CARE_PROVIDER_SITE_OTHER): Payer: Self-pay

## 2023-06-03 MED ORDER — GLIPIZIDE 10 MG PO TABS: 10.0000 mg | ORAL_TABLET | Freq: Two times a day (BID) | ORAL | 1 refills | Status: DC

## 2023-06-03 NOTE — Telephone Encounter (Signed)
Rx sent to [facility] in [place] for patient.   Can you order the mammogram? Thanks. Dm/cma

## 2023-06-14 ENCOUNTER — Ambulatory Visit (INDEPENDENT_AMBULATORY_CARE_PROVIDER_SITE_OTHER): Payer: PPO

## 2023-06-14 DIAGNOSIS — E2839 Other primary ovarian failure: Secondary | ICD-10-CM | POA: Diagnosis not present

## 2023-06-14 DIAGNOSIS — Z0000 Encounter for general adult medical examination without abnormal findings: Secondary | ICD-10-CM | POA: Diagnosis not present

## 2023-06-14 NOTE — Patient Instructions (Signed)
Tina Baker , Thank you for taking time to come for your Medicare Wellness Visit. I appreciate your ongoing commitment to your health goals. Please review the following plan we discussed and let me know if I can assist you in the future.   Referrals/Orders/Follow-Ups/Clinician Recommendations: bone desity  You have an order for:  []   2D Mammogram  []   3D Mammogram  [x]   Bone Density     Please call for appointment:  [facility]  Make sure to wear two-piece clothing.  No lotions, powders, or deodorants the day of the appointment. Make sure to bring picture ID and insurance card.  Bring list of medications you are currently taking including any supplements.   Schedule your [place] screening mammogram through MyChart!   Log into your MyChart account.  Go to 'Visit' (or 'Appointments' if on mobile App) --> Schedule an Appointment  Under 'Select a Reason for Visit' choose the Mammogram Screening option.  Complete the pre-visit questions and select the time and place that best fits your schedule.    This is a list of the screening recommended for you and due dates:  Health Maintenance  Topic Date Due   Flu Shot  06/03/2023   Complete foot exam   10/01/2023*   DEXA scan (bone density measurement)  10/01/2023*   COVID-19 Vaccine (3 - Pfizer risk series) 10/01/2023*   Hepatitis C Screening  10/01/2023*   Zoster (Shingles) Vaccine (1 of 2) 10/01/2023*   Hemoglobin A1C  10/01/2023   Eye exam for diabetics  03/21/2024   Yearly kidney function blood test for diabetes  03/30/2024   Yearly kidney health urinalysis for diabetes  03/30/2024   Medicare Annual Wellness Visit  06/13/2024   DTaP/Tdap/Td vaccine (3 - Td or Tdap) 06/29/2030   Pneumonia Vaccine  Completed   HPV Vaccine  Aged Out   Colon Cancer Screening  Discontinued  *Topic was postponed. The date shown is not the original due date.    Advanced directives: (Copy Requested) Please bring a copy of  your health care power of attorney and living will to the office to be added to your chart at your convenience.  Next Medicare Annual Wellness Visit scheduled for next year: Yes  Preventive Care 20 Years and Older, Female Preventive care refers to lifestyle choices and visits with your health care provider that can promote health and wellness. What does preventive care include? A yearly physical exam. This is also called an annual well check. Dental exams once or twice a year. Routine eye exams. Ask your health care provider how often you should have your eyes checked. Personal lifestyle choices, including: Daily care of your teeth and gums. Regular physical activity. Eating a healthy diet. Avoiding tobacco and drug use. Limiting alcohol use. Practicing safe sex. Taking low-dose aspirin every day. Taking vitamin and mineral supplements as recommended by your health care provider. What happens during an annual well check? The services and screenings done by your health care provider during your annual well check will depend on your age, overall health, lifestyle risk factors, and family history of disease. Counseling  Your health care provider may ask you questions about your: Alcohol use. Tobacco use. Drug use. Emotional well-being. Home and relationship well-being. Sexual activity. Eating habits. History of falls. Memory and ability to understand (cognition). Work and work Astronomer. Reproductive health. Screening  You may have the following tests or measurements: Height, weight, and BMI. Blood pressure. Lipid and cholesterol levels. These may be  checked every 5 years, or more frequently if you are over 57 years old. Skin check. Lung cancer screening. You may have this screening every year starting at age 24 if you have a 30-pack-year history of smoking and currently smoke or have quit within the past 15 years. Fecal occult blood test (FOBT) of the stool. You may have this  test every year starting at age 56. Flexible sigmoidoscopy or colonoscopy. You may have a sigmoidoscopy every 5 years or a colonoscopy every 10 years starting at age 46. Hepatitis C blood test. Hepatitis B blood test. Sexually transmitted disease (STD) testing. Diabetes screening. This is done by checking your blood sugar (glucose) after you have not eaten for a while (fasting). You may have this done every 1-3 years. Bone density scan. This is done to screen for osteoporosis. You may have this done starting at age 34. Mammogram. This may be done every 1-2 years. Talk to your health care provider about how often you should have regular mammograms. Talk with your health care provider about your test results, treatment options, and if necessary, the need for more tests. Vaccines  Your health care provider may recommend certain vaccines, such as: Influenza vaccine. This is recommended every year. Tetanus, diphtheria, and acellular pertussis (Tdap, Td) vaccine. You may need a Td booster every 10 years. Zoster vaccine. You may need this after age 54. Pneumococcal 13-valent conjugate (PCV13) vaccine. One dose is recommended after age 54. Pneumococcal polysaccharide (PPSV23) vaccine. One dose is recommended after age 55. Talk to your health care provider about which screenings and vaccines you need and how often you need them. This information is not intended to replace advice given to you by your health care provider. Make sure you discuss any questions you have with your health care provider. Document Released: 11/15/2015 Document Revised: 07/08/2016 Document Reviewed: 08/20/2015 Elsevier Interactive Patient Education  2017 [employer].  Fall Prevention in the Home Falls can cause injuries. They can happen to people of all ages. There are many things you can do to make your home safe and to help prevent falls. What can I do on the outside of my home? Regularly fix the edges of walkways and  driveways and fix any cracks. Remove anything that might make you trip as you walk through a door, such as a raised step or threshold. Trim any bushes or trees on the path to your home. Use bright outdoor lighting. Clear any walking paths of anything that might make someone trip, such as rocks or tools. Regularly check to see if handrails are loose or broken. Make sure that both sides of any steps have handrails. Any raised decks and porches should have guardrails on the edges. Have any leaves, snow, or ice cleared regularly. Use sand or salt on walking paths during winter. Clean up any spills in your garage right away. This includes oil or grease spills. What can I do in the bathroom? Use night lights. Install grab bars by the toilet and in the tub and shower. Do not use towel bars as grab bars. Use non-skid mats or decals in the tub or shower. If you need to sit down in the shower, use a plastic, non-slip stool. Keep the floor dry. Clean up any water that spills on the floor as soon as it happens. Remove soap buildup in the tub or shower regularly. Attach bath mats securely with double-sided non-slip rug tape. Do not have throw rugs and other things on the floor that  can make you trip. What can I do in the bedroom? Use night lights. Make sure that you have a light by your bed that is easy to reach. Do not use any sheets or blankets that are too big for your bed. They should not hang down onto the floor. Have a firm chair that has side arms. You can use this for support while you get dressed. Do not have throw rugs and other things on the floor that can make you trip. What can I do in the kitchen? Clean up any spills right away. Avoid walking on wet floors. Keep items that you use a lot in easy-to-reach places. If you need to reach something above you, use a strong step stool that has a grab bar. Keep electrical cords out of the way. Do not use floor polish or wax that makes floors  slippery. If you must use wax, use non-skid floor wax. Do not have throw rugs and other things on the floor that can make you trip. What can I do with my stairs? Do not leave any items on the stairs. Make sure that there are handrails on both sides of the stairs and use them. Fix handrails that are broken or loose. Make sure that handrails are as long as the stairways. Check any carpeting to make sure that it is firmly attached to the stairs. Fix any carpet that is loose or worn. Avoid having throw rugs at the top or bottom of the stairs. If you do have throw rugs, attach them to the floor with carpet tape. Make sure that you have a light switch at the top of the stairs and the bottom of the stairs. If you do not have them, ask someone to add them for you. What else can I do to help prevent falls? Wear shoes that: Do not have high heels. Have rubber bottoms. Are comfortable and fit you well. Are closed at the toe. Do not wear sandals. If you use a stepladder: Make sure that it is fully opened. Do not climb a closed stepladder. Make sure that both sides of the stepladder are locked into place. Ask someone to hold it for you, if possible. Clearly mark and make sure that you can see: Any grab bars or handrails. First and last steps. Where the edge of each step is. Use tools that help you move around (mobility aids) if they are needed. These include: Canes. Walkers. Scooters. Crutches. Turn on the lights when you go into a dark area. Replace any light bulbs as soon as they burn out. Set up your furniture so you have a clear path. Avoid moving your furniture around. If any of your floors are uneven, fix them. If there are any pets around you, be aware of where they are. Review your medicines with your doctor. Some medicines can make you feel dizzy. This can increase your chance of falling. Ask your doctor what other things that you can do to help prevent falls. This information is not  intended to replace advice given to you by your health care provider. Make sure you discuss any questions you have with your health care provider. Document Released: 08/15/2009 Document Revised: 03/26/2016 Document Reviewed: 11/23/2014 Elsevier Interactive Patient Education  2017 [employer].

## 2023-06-14 NOTE — Progress Notes (Signed)
Subjective:   Tina Baker is a 77 y.o. female who presents for Medicare Annual (Subsequent) preventive examination.  Visit Complete: Virtual  I connected with  Sharee Pimple on 06/14/23 by a audio enabled telemedicine application and verified that I am speaking with the correct person using two identifiers.  Patient Location: Home  Provider Location: Office/Clinic  I discussed the limitations of evaluation and management by telemedicine. The patient expressed understanding and agreed to proceed.  Patient Medicare AWV questionnaire was completed by the patient on 06/08/2023; I have confirmed that all information answered by patient is correct and no changes since this date.  Vital Signs: Unable to obtain new vitals due to this being a telehealth visit.  Review of Systems     Cardiac Risk Factors include: advanced age (>18men, >15 women);diabetes mellitus     Objective:    Today's Vitals   06/14/23 1426  PainSc: 5    There is no height or weight on file to calculate BMI.     06/14/2023    2:33 PM 03/07/2018    3:03 PM  Advanced Directives  Does Patient Have a Medical Advance Directive? Yes No  Type of Estate agent of [place];Living will   Copy of Healthcare Power of Attorney in Chart? No - copy requested   Would patient like information on creating a medical advance directive?  No - Patient declined    Current Medications (verified) Outpatient Encounter Medications as of 06/14/2023  Medication Sig   acetaminophen (TYLENOL) 325 MG tablet Take 650 mg by mouth every 4 (four) hours as needed for moderate pain.   ALPRAZolam (XANAX) 0.5 MG tablet Take 1 tablet (0.5 mg total) by mouth at bedtime as needed.   aspirin 81 MG tablet Take 81 mg by mouth daily.     B Complex Vitamins (VITAMIN B-COMPLEX) TABS Take 1 tablet by mouth daily.   CARTIA XT 300 MG 24 hr capsule Take 1 capsule by mouth daily.   Chlorpheniramine-DM 4-30 MG TABS Take 1 tablet by  mouth as needed.   cholecalciferol (VITAMIN D3) 25 MCG (1000 UT) tablet Take 2,000 Units by mouth daily.   dapagliflozin propanediol (FARXIGA) 10 MG TABS tablet Take 1 tablet by mouth daily.   dofetilide (TIKOSYN) 250 MCG capsule Take 1 capsule by mouth 2 (two) times daily.   fluticasone (FLONASE) 50 MCG/ACT nasal spray Place 2 sprays into both nostrils daily.   furosemide (LASIX) 20 MG tablet Take 60 mg by mouth daily.   glipiZIDE (GLUCOTROL) 10 MG tablet Take 1 tablet (10 mg total) by mouth 2 (two) times daily before a meal. TAKE 1 TABLET BY MOUTH TWICE DAILY BEFORE MEALS   glucose blood (RELION PRIME TEST) test strip 1 each by Other route See admin instructions.   insulin degludec (TRESIBA) 100 UNIT/ML FlexTouch Pen Inject 16 Units into the skin at bedtime.   Insulin Pen Needle (GLOBAL EASY GLIDE PEN NEEDLES) 32G X 4 MM MISC Inject 1 each into the skin See admin instructions.   lansoprazole (PREVACID) 30 MG capsule Take 30 mg by mouth daily.     metoprolol tartrate (LOPRESSOR) 50 MG tablet Take 50 mg by mouth 2 (two) times daily.   mupirocin nasal ointment (BACTROBAN) 2 % Place 1 application  into the nose as needed. Use one-half of tube in each nostril twice daily for five (5) days. After application, press sides of nose together and gently massage.   ondansetron (ZOFRAN-ODT) 4 MG disintegrating tablet Place  1 tablet under your tongue every 8 hours as needed   Pramipexole Dihydrochloride 1.5 MG TB24 Take 1.5 mg by mouth.   warfarin (COUMADIN) 5 MG tablet Take by mouth.   spironolactone (ALDACTONE) 25 MG tablet Take 0.5 tablets by mouth daily. (Patient not taking: Reported on 05/10/2023)   No facility-administered encounter medications on file as of 06/14/2023.    Allergies (verified) Statins and Valdecoxib   History: Past Medical History:  Diagnosis Date   Arthritis    Atrial fibrillation (HCC)    Diabetes mellitus    Hyperlipidemia    Hypertension    Past Surgical History:   Procedure Laterality Date   ABDOMINAL HYSTERECTOMY  2001   BUNIONECTOMY Bilateral 1979   GALLBLADDER SURGERY     SPINE SURGERY  2010   protruding disc    Family History  Problem Relation Age of Onset   Cancer Mother 6       leukemia    Heart disease Father 45       heart attack    Heart disease Sister    Social History   Socioeconomic History   Marital status: Divorced    Spouse name: Not on file   Number of children: Not on file   Years of education: Not on file   Highest education level: Not on file  Occupational History   Not on file  Tobacco Use   Smoking status: Never   Smokeless tobacco: Never  Substance and Sexual Activity   Alcohol use: No   Drug use: No   Sexual activity: Not on file  Other Topics Concern   Not on file  Social History Narrative   Not on file   Social Determinants of Health   Financial Resource Strain: Low Risk  (06/08/2023)   Overall Financial Resource Strain (CARDIA)    Difficulty of Paying Living Expenses: Not very hard  Food Insecurity: Unknown (06/08/2023)   Hunger Vital Sign    Worried About Running Out of Food in the Last Year: Not on file    Ran Out of Food in the Last Year: Never true  Transportation Needs: No Transportation Needs (06/08/2023)   PRAPARE - Administrator, Civil Service (Medical): No    Lack of Transportation (Non-Medical): No  Physical Activity: Inactive (06/08/2023)   Exercise Vital Sign    Days of Exercise per Week: 0 days    Minutes of Exercise per Session: 0 min  Stress: No Stress Concern Present (06/08/2023)   [employer] of Occupational Health - Occupational Stress Questionnaire    Feeling of Stress : Not at all  Social Connections: Unknown (06/08/2023)   Social Connection and Isolation Panel [NHANES]    Frequency of Communication with Friends and Family: More than three times a week    Frequency of Social Gatherings with Friends and Family: More than three times a week    Attends Religious  Services: Not on Marketing executive or Organizations: Yes    Attends Banker Meetings: 1 to 4 times per year    Marital Status: Divorced    Tobacco Counseling Counseling given: Not Answered   Clinical Intake:  Pre-visit preparation completed: Yes  Pain : 0-10 Pain Score: 5  Pain Type: Chronic pain Pain Location: Back Pain Orientation: Lower Pain Radiating Towards: down to hips Pain Descriptors / Indicators: Aching Pain Onset: More than a month ago Pain Frequency: Constant     Nutritional Risks: None Diabetes: Yes  CBG done?: No Did pt. bring in CBG monitor from home?: No  How often do you need to have someone help you when you read instructions, pamphlets, or other written materials from your doctor or pharmacy?: 1 - Never  Interpreter Needed?: No  Information entered by :: NAllen LPN   Activities of Daily Living    06/08/2023    8:49 AM  In your present state of health, do you have any difficulty performing the following activities:  Hearing? 0  Vision? 0  Difficulty concentrating or making decisions? 1  Comment it'll come back  Walking or climbing stairs? 1  Comment due to back and hips  Dressing or bathing? 0  Doing errands, shopping? 0  Preparing Food and eating ? N  Using the Toilet? N  In the past six months, have you accidently leaked urine? Y  Comment occasionally  Do you have problems with loss of bowel control? N  Managing your Medications? N  Managing your Finances? N  Housekeeping or managing your Housekeeping? Y  Comment hurts back    Patient Care Team: Salvatore Decent, FNP as PCP - General (Internal Medicine) Ranee Gosselin, MD as Consulting Physician (Orthopedic Surgery)  Indicate any recent Medical Services you may have received from other than [facility] providers in the past year (date may be approximate).     Assessment:   This is a routine wellness examination for Jet.  Hearing/Vision screen Hearing  Screening - Comments:: Denies hearing issues Vision Screening - Comments:: Regular eye exams, Dr. Jillyn Hidden, [facility]  Dietary issues and exercise activities discussed:     Goals Addressed             This Visit's Progress    Patient Stated       06/14/2023, wants to lose weight       Depression Screen    06/14/2023    2:34 PM 05/10/2023    3:23 PM 03/31/2023    9:00 AM 03/07/2018    3:04 PM  PHQ 2/9 Scores  PHQ - 2 Score 0 0 0 1  PHQ- 9 Score 0       Fall Risk    06/08/2023    8:49 AM 05/10/2023    3:23 PM 03/31/2023    9:00 AM  Fall Risk   Falls in the past year? 0 0 0  Number falls in past yr: 0 0 0  Injury with Fall? 0 0 0  Risk for fall due to : Medication side effect No Fall Risks Impaired balance/gait  Follow up Falls prevention discussed;Falls evaluation completed Falls prevention discussed Falls prevention discussed    MEDICARE RISK AT HOME:  Medicare Risk at Home - 06/14/23 1434     Any stairs in or around the home? No    Home free of loose throw rugs in walkways, pet beds, electrical cords, etc? No    Adequate lighting in your home to reduce risk of falls? Yes    Life alert? No    Use of a cane, walker or w/c? No    Grab bars in the bathroom? No    Shower chair or bench in shower? Yes    Elevated toilet seat or a handicapped toilet? No             TIMED UP AND GO:  Was the test performed?  No    Cognitive Function:        06/14/2023    2:35 PM  6CIT Screen  What  Year? 0 points  What month? 0 points  What time? 0 points  Count back from 20 0 points  Months in reverse 0 points  Repeat phrase 0 points  Total Score 0 points    Immunizations Immunization History  Administered Date(s) Administered   Influenza Split 07/31/2015   Influenza, High Dose Seasonal PF 09/29/2017, 11/27/2019, 07/23/2020, 08/07/2021, 08/26/2022   Influenza,inj,Quad PF,6+ Mos 08/10/2016   Influenza-Unspecified 07/09/2015   PFIZER(Purple Top)SARS-COV-2 Vaccination  12/27/2019, 01/17/2020   Pneumococcal Conjugate PCV 7 07/20/2014   Pneumococcal Conjugate-13 03/22/2014   Pneumococcal Polysaccharide-23 08/10/2016   Td (Adult),5 Lf Tetanus Toxid, Preservative Free 08/30/2003   Tdap 08/10/2016, 06/29/2020   Zoster, Live 10/21/2011    TDAP status: Up to date  Flu Vaccine status: Due, Education has been provided regarding the importance of this vaccine. Advised may receive this vaccine at local pharmacy or Health Dept. Aware to provide a copy of the vaccination record if obtained from local pharmacy or Health Dept. Verbalized acceptance and understanding.  Pneumococcal vaccine status: Up to date  Covid-19 vaccine status: Information provided on how to obtain vaccines.   Qualifies for Shingles Vaccine? Yes   Zostavax completed Yes   Shingrix Completed?: No.    Education has been provided regarding the importance of this vaccine. Patient has been advised to call insurance company to determine out of pocket expense if they have not yet received this vaccine. Advised may also receive vaccine at local pharmacy or Health Dept. Verbalized acceptance and understanding.  Screening Tests Health Maintenance  Topic Date Due   INFLUENZA VACCINE  06/03/2023   FOOT EXAM  10/01/2023 (Originally 11/05/1955)   DEXA SCAN  10/01/2023 (Originally 11/06/2011)   COVID-19 Vaccine (3 - Pfizer risk series) 10/01/2023 (Originally 02/14/2020)   Hepatitis C Screening  10/01/2023 (Originally 11/05/1963)   Zoster Vaccines- Shingrix (1 of 2) 10/01/2023 (Originally 11/05/1964)   HEMOGLOBIN A1C  10/01/2023   OPHTHALMOLOGY EXAM  03/21/2024   Diabetic kidney evaluation - eGFR measurement  03/30/2024   Diabetic kidney evaluation - Urine ACR  03/30/2024   Medicare Annual Wellness (AWV)  06/13/2024   DTaP/Tdap/Td (3 - Td or Tdap) 06/29/2030   Pneumonia Vaccine 20+ Years old  Completed   HPV VACCINES  Aged Out   Colonoscopy  Discontinued    Health Maintenance  Health Maintenance Due   Topic Date Due   INFLUENZA VACCINE  06/03/2023    Colorectal cancer screening: No longer required.   Mammogram status: Ordered 06/03/2023. Pt provided with contact info and advised to call to schedule appt.   Bone Density status: ordered today  Lung Cancer Screening: (Low Dose CT Chest recommended if Age 3-80 years, 20 pack-year currently smoking OR have quit w/in 15years.) does not qualify.   Lung Cancer Screening Referral: no  Additional Screening:  Hepatitis C Screening: does not qualify;   Vision Screening: Recommended annual ophthalmology exams for early detection of glaucoma and other disorders of the eye. Is the patient up to date with their annual eye exam?  Yes  Who is the provider or what is the name of the office in which the patient attends annual eye exams? [facility] If pt is not established with a provider, would they like to be referred to a provider to establish care? No .   Dental Screening: Recommended annual dental exams for proper oral hygiene  Diabetic Foot Exam: Diabetic Foot Exam: Overdue, Pt has been advised about the importance in completing this exam. Pt is scheduled for  diabetic foot exam on next appointment.  Community Resource Referral / Chronic Care Management: CRR required this visit?  No   CCM required this visit?  No     Plan:     I have personally reviewed and noted the following in the patient's chart:   Medical and social history Use of alcohol, tobacco or illicit drugs  Current medications and supplements including opioid prescriptions. Patient is not currently taking opioid prescriptions. Functional ability and status Nutritional status Physical activity Advanced directives List of other physicians Hospitalizations, surgeries, and ER visits in previous 12 months Vitals Screenings to include cognitive, depression, and falls Referrals and appointments  In addition, I have reviewed and discussed with patient certain  preventive protocols, quality metrics, and best practice recommendations. A written personalized care plan for preventive services as well as general preventive health recommendations were provided to patient.     Barb Merino, LPN   01/23/4009   After Visit Summary: (MyChart) Due to this being a telephonic visit, the after visit summary with patients personalized plan was offered to patient via MyChart   Nurse Notes: none

## 2023-06-22 ENCOUNTER — Ambulatory Visit (INDEPENDENT_AMBULATORY_CARE_PROVIDER_SITE_OTHER): Payer: PPO

## 2023-06-22 ENCOUNTER — Encounter: Payer: Self-pay | Admitting: Internal Medicine

## 2023-06-22 ENCOUNTER — Ambulatory Visit: Payer: PPO | Admitting: Nurse Practitioner

## 2023-06-22 ENCOUNTER — Ambulatory Visit: Payer: PPO | Admitting: Internal Medicine

## 2023-06-22 ENCOUNTER — Telehealth: Payer: Self-pay | Admitting: Internal Medicine

## 2023-06-22 ENCOUNTER — Other Ambulatory Visit: Payer: Self-pay | Admitting: Internal Medicine

## 2023-06-22 VITALS — BP 120/66 | HR 62 | Temp 97.9°F | Ht 66.5 in | Wt 229.2 lb

## 2023-06-22 DIAGNOSIS — R0602 Shortness of breath: Secondary | ICD-10-CM

## 2023-06-22 LAB — CBC WITH DIFFERENTIAL/PLATELET
Basophils Absolute: 0.1 10*3/uL (ref 0.0–0.1)
Basophils Relative: 0.6 % (ref 0.0–3.0)
Eosinophils Absolute: 0.2 10*3/uL (ref 0.0–0.7)
Eosinophils Relative: 1.9 % (ref 0.0–5.0)
HCT: 41.3 % (ref 36.0–46.0)
Hemoglobin: 13.1 g/dL (ref 12.0–15.0)
Lymphocytes Relative: 11.9 % — ABNORMAL LOW (ref 12.0–46.0)
Lymphs Abs: 1.5 10*3/uL (ref 0.7–4.0)
MCHC: 31.6 g/dL (ref 30.0–36.0)
MCV: 92.3 fl (ref 78.0–100.0)
Monocytes Absolute: 0.7 10*3/uL (ref 0.1–1.0)
Monocytes Relative: 5.3 % (ref 3.0–12.0)
Neutro Abs: 10.3 10*3/uL — ABNORMAL HIGH (ref 1.4–7.7)
Neutrophils Relative %: 80.3 % — ABNORMAL HIGH (ref 43.0–77.0)
Platelets: 159 10*3/uL (ref 150.0–400.0)
RBC: 4.48 Mil/uL (ref 3.87–5.11)
RDW: 18.9 % — ABNORMAL HIGH (ref 11.5–15.5)
WBC: 12.8 10*3/uL — ABNORMAL HIGH (ref 4.0–10.5)

## 2023-06-22 LAB — BASIC METABOLIC PANEL
BUN: 20 mg/dL (ref 6–23)
CO2: 27 mEq/L (ref 19–32)
Calcium: 8.9 mg/dL (ref 8.4–10.5)
Chloride: 101 mEq/L (ref 96–112)
Creatinine, Ser: 0.83 mg/dL (ref 0.40–1.20)
GFR: 68.38 mL/min (ref >60.00)
Glucose, Bld: 235 mg/dL — ABNORMAL HIGH (ref 70–99)
Potassium: 4.3 mEq/L (ref 3.5–5.1)
Sodium: 139 mEq/L (ref 135–145)

## 2023-06-22 LAB — POC COVID19 BINAXNOW

## 2023-06-22 MED ORDER — POTASSIUM CHLORIDE CRYS ER 20 MEQ PO TBCR: 20.0000 meq | EXTENDED_RELEASE_TABLET | Freq: Every day | ORAL | 0 refills | Status: DC

## 2023-06-22 NOTE — Telephone Encounter (Signed)
Pt called in question of whether you want her to take the potassium you called in due to her range is normal today after  her labs. Please call [phone]

## 2023-06-22 NOTE — Progress Notes (Signed)
[facility] [address] [place] [address] Dept: [phone] Dept Fax: [phone]  Acute Care Office Visit  Subjective:   Tina Baker 1946/09/26 06/22/2023  Chief Complaint  Patient presents with   Shortness of Breath    Started 3 days ago, wants to check for fluid on her lungs O2 states running in the 80's     HPI: Tina Baker is a 77 yo F with PMHx of HTN, CHF, restrictive lung disease with O2 dependency, Afib, CAP, and T2DM who complains of Shortness of breath onset 3 days ago. She states home O2 saturation has been in the 80's when she is walking. She takes deep breaths to increase O2 level. Patient is currently on 3L [place] O2. Did try to increase supplemental O2 to 4L, but did not help SHOB. She also increased her Lasix over the past 2 days. She states she has taken 60mg  BID 2 days ago, and then 60mg  in AM and 40mg  in PM yesterday. She states she feels like she noticed some improvement of SHOB with the recent increase.  She states she feels heavy in her lungs. Associated dry cough and bilateral LE edema. Patient was recently seen by cardiology 1 week ago for palpitations, Metoprolol was increased to 75mg  BID, and has not had palpitations since then.  Denies fever, chills, URI symptoms,CP, dizziness, orthopnea, syncopal episodes.Sleeps on 1 pillow at nighttime.  No recent sick contacts.  Last echo: 04/16/23. EF of 65-70%. Severe right ventricular hypertrophy, mild tricuspid regurg, pulmonary hypertension.   Wt Readings from Last 3 Encounters:  06/22/23 229 lb 3.2 oz (104 kg)  05/10/23 218 lb 12.8 oz (99.2 kg)  03/31/23 217 lb 12.8 oz (98.8 kg)     The following portions of the patient's history were reviewed and updated as appropriate: past medical history, past surgical history, family history, social history, allergies, medications, and problem list.   Patient Active Problem List   Diagnosis Date Noted    Impaired functional mobility, balance, gait, and endurance 03/31/2023   Irritant contact dermatitis 03/31/2023   Arthralgia of right wrist 03/31/2023   Lumbar radiculopathy 02/22/2023   Acute idiopathic gout of right foot 02/02/2023   Chronic bilateral low back pain without sciatica 01/05/2023   Bilateral hip pain 01/05/2023   Impaired ambulation 01/05/2023   Weakness of both lower extremities 01/05/2023   Tinea unguium 12/01/2022   Heart failure (HCC) 04/09/2022   Acute on chronic heart failure with preserved ejection fraction (HFpEF) (HCC) 04/01/2022   Cryptogenic cirrhosis (HCC) 02/19/2022   Hypoglycemia 11/13/2021   Chronic respiratory failure with hypoxia (HCC) 11/12/2021   CAP (community acquired pneumonia) 11/12/2021   Subtherapeutic anticoagulation 11/12/2021   TIA (transient ischemic attack) 11/07/2021   Aspiration pneumonia (HCC) 09/17/2021   RSV (respiratory syncytial virus infection) 09/17/2021   Senile nuclear sclerosis 03/18/2021   Hypertrophy of bone, right ankle and foot 01/24/2021   Restrictive lung disease 05/17/2020   History of colon polyps 12/11/2019   Hyperbilirubinemia 12/11/2019   Thrombocytopenia (HCC) 12/11/2019   Pulmonary hypertension (HCC) 11/24/2019   Bilateral pleural effusion 10/25/2019   Primary osteoarthritis of left hip 06/14/2019   Trochanteric bursitis of left hip 06/14/2019   Intolerance of continuous positive airway pressure (CPAP) ventilation 05/03/2019   Primary osteoarthritis of left knee 05/01/2019   Primary osteoarthritis of right knee 05/01/2019   Bilateral carpal tunnel syndrome 05/01/2019   Trigger thumb of left hand 05/01/2019   ANA positive 02/23/2019  Rheumatoid factor positive 02/23/2019   GI bleed 02/17/2019   Generalized anxiety disorder 11/27/2018   DNR (do not resuscitate) 11/27/2018   Elevated d-dimer 11/27/2018   Hyponatremia 11/27/2018   Major depressive disorder with current active episode 11/27/2018   Obesity (BMI  30-39.9) 11/27/2018   Essential hypertension 11/27/2018   Acute hypoxemic respiratory failure (HCC) 11/27/2018   Obstructive sleep apnea (adult) (pediatric) 08/12/2018   Acute pulmonary edema (HCC) 07/08/2018   Recurrent major depressive disorder, in partial remission (HCC) 04/13/2018   Varicose veins of left lower extremity with pain 04/13/2018   Constipation 02/19/2018   Long term (current) use of anticoagulants 12/31/2015   Acromioclavicular joint arthritis 12/31/2015   Subacromial bursitis 12/31/2015   Allergic rhinitis 11/13/2015   Benign essential hypertension 11/13/2015   Chronic tension-type headache, not intractable 11/13/2015   DDD (degenerative disc disease), lumbosacral 11/13/2015   Gastroesophageal reflux disease without esophagitis 11/13/2015   Paroxysmal atrial fibrillation (HCC) 11/13/2015   RLS (restless legs syndrome) 11/13/2015   Vitamin D deficiency 11/13/2015   Insomnia 11/13/2015   Class 1 obesity due to excess calories with serious comorbidity and body mass index (BMI) of 33.0 to 33.9 in adult 11/13/2015   Contracture of tendon sheath 11/13/2015   Spinal stenosis of lumbar region without neurogenic claudication 11/13/2015   Gout, arthritis 11/13/2015   Iron deficiency anemia due to chronic blood loss 11/13/2015   Lumbar herniated disc 11/13/2015   Rotator cuff tendinitis 11/13/2015   Swelling 09/30/2015   Chest discomfort 07/18/2015   Hospital discharge follow-up 07/04/2015   Leukocytosis 06/27/2015   Anemia, unspecified 06/26/2015   Type 2 diabetes mellitus, without long-term current use of insulin (HCC) 06/25/2015   Venous insufficiency, peripheral 01/09/2015   Spondylolisthesis of lumbar region 11/03/2012   Past Medical History:  Diagnosis Date   Arthritis    Atrial fibrillation (HCC)    Diabetes mellitus    Hyperlipidemia    Hypertension    Past Surgical History:  Procedure Laterality Date   ABDOMINAL HYSTERECTOMY  2001   BUNIONECTOMY  Bilateral 1979   GALLBLADDER SURGERY     SPINE SURGERY  2010   protruding disc    Family History  Problem Relation Age of Onset   Cancer Mother 71       leukemia    Heart disease Father 54       heart attack    Heart disease Sister    Outpatient Medications Prior to Visit  Medication Sig Dispense Refill   acetaminophen (TYLENOL) 325 MG tablet Take 650 mg by mouth every 4 (four) hours as needed for moderate pain.     ALPRAZolam (XANAX) 0.5 MG tablet Take 1 tablet (0.5 mg total) by mouth at bedtime as needed. 90 tablet 0   aspirin 81 MG tablet Take 81 mg by mouth daily.       B Complex Vitamins (VITAMIN B-COMPLEX) TABS Take 1 tablet by mouth daily.     CARTIA XT 300 MG 24 hr capsule Take 1 capsule by mouth daily.  11   Chlorpheniramine-DM 4-30 MG TABS Take 1 tablet by mouth as needed.     cholecalciferol (VITAMIN D3) 25 MCG (1000 UT) tablet Take 2,000 Units by mouth daily.     dapagliflozin propanediol (FARXIGA) 10 MG TABS tablet Take 1 tablet by mouth daily.     dofetilide (TIKOSYN) 250 MCG capsule Take 1 capsule by mouth 2 (two) times daily.     fluticasone (FLONASE) 50 MCG/ACT nasal spray Place 2 sprays  into both nostrils daily. 16 g 2   furosemide (LASIX) 20 MG tablet Take 60 mg by mouth daily.     glipiZIDE (GLUCOTROL) 10 MG tablet Take 1 tablet (10 mg total) by mouth 2 (two) times daily before a meal. TAKE 1 TABLET BY MOUTH TWICE DAILY BEFORE MEALS 180 tablet 1   glucose blood (RELION PRIME TEST) test strip 1 each by Other route See admin instructions.     insulin degludec (TRESIBA) 100 UNIT/ML FlexTouch Pen Inject 16 Units into the skin at bedtime.     Insulin Pen Needle (GLOBAL EASY GLIDE PEN NEEDLES) 32G X 4 MM MISC Inject 1 each into the skin See admin instructions. 200 each 3   lansoprazole (PREVACID) 30 MG capsule Take 30 mg by mouth daily.       metoprolol tartrate (LOPRESSOR) 50 MG tablet Take 75 mg by mouth 2 (two) times daily. Changed to 75 mg     mupirocin nasal  ointment (BACTROBAN) 2 % Place 1 application  into the nose as needed. Use one-half of tube in each nostril twice daily for five (5) days. After application, press sides of nose together and gently massage.     ondansetron (ZOFRAN-ODT) 4 MG disintegrating tablet Place 1 tablet under your tongue every 8 hours as needed  0   Pramipexole Dihydrochloride 1.5 MG TB24 Take 1.5 mg by mouth.     spironolactone (ALDACTONE) 25 MG tablet Take 0.5 tablets by mouth daily.     warfarin (COUMADIN) 5 MG tablet Take by mouth. 2 days a week taking 7mg      No facility-administered medications prior to visit.   Allergies  Allergen Reactions   Statins Other (See Comments)    myalgia   Valdecoxib Rash          ROS: A complete ROS was performed with pertinent positives/negatives noted in the HPI. The remainder of the ROS are negative.    Objective:   Today's Vitals   06/22/23 0947 06/22/23 1023  BP: 120/66   Pulse: 62   Temp: 97.9 F (36.6 C)   TempSrc: Temporal   SpO2: 95% (!) 88%  Weight: 229 lb 3.2 oz (104 kg)   Height: 5' 6.5" (1.689 m)     GENERAL: Well-appearing, in NAD. Well nourished.  SKIN: Pink, warm and dry. No rash. NECK: Trachea midline. Full ROM w/o pain or tenderness. No lymphadenopathy.  RESPIRATORY: Chest wall symmetrical. SHOB with exertion. Diminished bilateral lower lobes, faint intermitttent crackle in R. Lower lobe. No wheezing or rhonchi.  CARDIAC: S1, S2 present, regular rate and rhythm. Peripheral pulses 2+ bilaterally.  EXTREMITIES: Without clubbing, cyanosis. 1+ BLE edema.  PSYCH/MENTAL STATUS: Alert, oriented x 3. Cooperative, appropriate mood and affect.   88% Spo2 reading while ambulating in office. Upon patient sitting back down to rest,  O2 sat improved back to 95%.       Results for orders placed or performed in visit on 06/22/23  CBC with Differential/Platelet  Result Value Ref Range   WBC 12.8 (H) 4.0 - 10.5 K/uL   RBC 4.48 3.87 - 5.11 Mil/uL    Hemoglobin 13.1 12.0 - 15.0 g/dL   HCT 46.9 62.9 - 52.8 %   MCV 92.3 78.0 - 100.0 fl   MCHC 31.6 30.0 - 36.0 g/dL   RDW 41.3 (H) 24.4 - 01.0 %   Platelets 159.0 150.0 - 400.0 K/uL   Neutrophils Relative % 80.3 (H) 43.0 - 77.0 %   Lymphocytes Relative 11.9 (L) 12.0 -  46.0 %   Monocytes Relative 5.3 3.0 - 12.0 %   Eosinophils Relative 1.9 0.0 - 5.0 %   Basophils Relative 0.6 0.0 - 3.0 %   Neutro Abs 10.3 (H) 1.4 - 7.7 K/uL   Lymphs Abs 1.5 0.7 - 4.0 K/uL   Monocytes Absolute 0.7 0.1 - 1.0 K/uL   Eosinophils Absolute 0.2 0.0 - 0.7 K/uL   Basophils Absolute 0.1 0.0 - 0.1 K/uL  Basic Metabolic Panel (BMET)  Result Value Ref Range   Sodium 139 135 - 145 mEq/L   Potassium 4.3 3.5 - 5.1 mEq/L   Chloride 101 96 - 112 mEq/L   CO2 27 19 - 32 mEq/L   Glucose, Bld 235 (H) 70 - 99 mg/dL   BUN 20 6 - 23 mg/dL   Creatinine, Ser 4.09 0.40 - 1.20 mg/dL   GFR 81.19 >14.78 mL/min   Calcium 8.9 8.4 - 10.5 mg/dL  POC GNFAO-13  Result Value Ref Range   SARS Coronavirus 2 Ag     Negative     CLINICAL DATA:  acute shortness of breath x 3 days. history of pleural effusions, CHF   EXAM: CHEST - 2 VIEW   COMPARISON:  Chest x-ray 11/17/2022.   FINDINGS: Slightly enlarged cardiac silhouette. Pulmonary vascular congestion. No consolidation. No visible pleural effusions or pneumothorax. No acute bony abnormality.   IMPRESSION: Similar cardiomegaly and pulmonary vascular congestion without overt pulmonary edema.     Electronically Signed   By: Feliberto Harts M.D.   On: 06/22/2023 12:30     Assessment & Plan:  1. Shortness of breath - DG Chest 2 View; Future - CBC with Differential/Platelet - Basic Metabolic Panel (BMET) - Pro b natriuretic peptide (BNP) - POC COVID-19 - Increase Lasix to 60mg  BID x 3 days  - supplement K+ with Klor-con once daily while taking lasix  - follow up in 3 days in office for reassessment.   Orders Placed This Encounter  Procedures   DG Chest 2  View    Standing Status:   Future    Number of Occurrences:   1    Standing Expiration Date:   12/23/2023    Order Specific Question:   Reason for Exam (SYMPTOM  OR DIAGNOSIS REQUIRED)    Answer:   acute shortness of breath x 3 days. history of pleural effusions, CHF    Order Specific Question:   Preferred imaging location?    Answer:   Internal   CBC with Differential/Platelet   Basic Metabolic Panel (BMET)   Pro b natriuretic peptide (BNP)   POC COVID-19    Order Specific Question:   Previously tested for COVID-19    Answer:   No    Order Specific Question:   Resident in a congregate (group) care setting    Answer:   No    Order Specific Question:   Employed in healthcare setting    Answer:   No    Order Specific Question:   Pregnant    Answer:   No   Lab Orders         CBC with Differential/Platelet         Basic Metabolic Panel (BMET)         Pro b natriuretic peptide (BNP)         POC COVID-19     No images are attached to the encounter or orders placed in the encounter.  Return in about 3 days (around 06/25/2023) for shortness of  breath.   Salvatore Decent, FNP

## 2023-06-22 NOTE — Patient Instructions (Signed)
I will notify you of results. I will be in contact with you by this evening.

## 2023-06-23 LAB — PRO B NATRIURETIC PEPTIDE: NT-Pro BNP: 1196 pg/mL — ABNORMAL HIGH (ref 0–738)

## 2023-06-23 NOTE — Telephone Encounter (Signed)
Patient was informed of lab results

## 2023-06-24 NOTE — Progress Notes (Signed)
[facility] [address] [place] [address] Dept: [phone] Dept Fax: [phone]  Acute Care Office Visit  Subjective:   CHEZ GRAUER 07/16/1946 06/25/2023  Chief Complaint  Patient presents with   Follow-up    No energy, discuss labs     HPI:/ Tina Baker presents for follow up of SHOB. Patient was seen on 06/22/23 for acute worsening SHOB. Found to have fluid in lungs. Elevated proBNP. Lasix dose was increased for 60mg  BID x 3 days, provided kdur supplement once daily to take while taking lasix. Today patient reports she is feeling fatigued. She has noticed that her swelling in LE's has improved with increasing her Lasix for 3 days, but she still feels short of breath. She went to her GI MD 2 days ago, WBC count was slightly elevated, neutrophils were elevated. Concern for respiratory infection. Negative COVID test at prior visit.  Denies chills, CP. She does not have a fever in clinic, but she reports her temperature is normally 97.42F    The following portions of the patient's history were reviewed and updated as appropriate: past medical history, past surgical history, family history, social history, allergies, medications, and problem list.   Patient Active Problem List   Diagnosis Date Noted   Impaired functional mobility, balance, gait, and endurance 03/31/2023   Irritant contact dermatitis 03/31/2023   Arthralgia of right wrist 03/31/2023   Lumbar radiculopathy 02/22/2023   Acute idiopathic gout of right foot 02/02/2023   Chronic bilateral low back pain without sciatica 01/05/2023   Bilateral hip pain 01/05/2023   Impaired ambulation 01/05/2023   Weakness of both lower extremities 01/05/2023   Tinea unguium 12/01/2022   Heart failure (HCC) 04/09/2022   Acute on chronic heart failure with preserved ejection fraction (HFpEF) (HCC) 04/01/2022   Cryptogenic cirrhosis (HCC) 02/19/2022   Hypoglycemia  11/13/2021   Chronic respiratory failure with hypoxia (HCC) 11/12/2021   CAP (community acquired pneumonia) 11/12/2021   Subtherapeutic anticoagulation 11/12/2021   TIA (transient ischemic attack) 11/07/2021   Aspiration pneumonia (HCC) 09/17/2021   RSV (respiratory syncytial virus infection) 09/17/2021   Senile nuclear sclerosis 03/18/2021   Hypertrophy of bone, right ankle and foot 01/24/2021   Restrictive lung disease 05/17/2020   History of colon polyps 12/11/2019   Hyperbilirubinemia 12/11/2019   Thrombocytopenia (HCC) 12/11/2019   Pulmonary hypertension (HCC) 11/24/2019   Bilateral pleural effusion 10/25/2019   Primary osteoarthritis of left hip 06/14/2019   Trochanteric bursitis of left hip 06/14/2019   Intolerance of continuous positive airway pressure (CPAP) ventilation 05/03/2019   Primary osteoarthritis of left knee 05/01/2019   Primary osteoarthritis of right knee 05/01/2019   Bilateral carpal tunnel syndrome 05/01/2019   Trigger thumb of left hand 05/01/2019   ANA positive 02/23/2019   Rheumatoid factor positive 02/23/2019   GI bleed 02/17/2019   Generalized anxiety disorder 11/27/2018   DNR (do not resuscitate) 11/27/2018   Elevated d-dimer 11/27/2018   Hyponatremia 11/27/2018   Major depressive disorder with current active episode 11/27/2018   Obesity (BMI 30-39.9) 11/27/2018   Essential hypertension 11/27/2018   Acute hypoxemic respiratory failure (HCC) 11/27/2018   Obstructive sleep apnea (adult) (pediatric) 08/12/2018   Acute pulmonary edema (HCC) 07/08/2018   Recurrent major depressive disorder, in partial remission (HCC) 04/13/2018   Varicose veins of left lower extremity with pain 04/13/2018   Constipation 02/19/2018   Long term (current) use of anticoagulants 12/31/2015   Acromioclavicular joint arthritis 12/31/2015   Subacromial bursitis  12/31/2015   Allergic rhinitis 11/13/2015   Benign essential hypertension 11/13/2015   Chronic tension-type  headache, not intractable 11/13/2015   DDD (degenerative disc disease), lumbosacral 11/13/2015   Gastroesophageal reflux disease without esophagitis 11/13/2015   Paroxysmal atrial fibrillation (HCC) 11/13/2015   RLS (restless legs syndrome) 11/13/2015   Vitamin D deficiency 11/13/2015   Insomnia 11/13/2015   Class 1 obesity due to excess calories with serious comorbidity and body mass index (BMI) of 33.0 to 33.9 in adult 11/13/2015   Contracture of tendon sheath 11/13/2015   Spinal stenosis of lumbar region without neurogenic claudication 11/13/2015   Gout, arthritis 11/13/2015   Iron deficiency anemia due to chronic blood loss 11/13/2015   Lumbar herniated disc 11/13/2015   Rotator cuff tendinitis 11/13/2015   Swelling 09/30/2015   Chest discomfort 07/18/2015   Hospital discharge follow-up 07/04/2015   Leukocytosis 06/27/2015   Anemia, unspecified 06/26/2015   Type 2 diabetes mellitus, without long-term current use of insulin (HCC) 06/25/2015   Venous insufficiency, peripheral 01/09/2015   Spondylolisthesis of lumbar region 11/03/2012   Past Medical History:  Diagnosis Date   Arthritis    Atrial fibrillation (HCC)    Diabetes mellitus    Hyperlipidemia    Hypertension    Past Surgical History:  Procedure Laterality Date   ABDOMINAL HYSTERECTOMY  2001   BUNIONECTOMY Bilateral 1979   GALLBLADDER SURGERY     SPINE SURGERY  2010   protruding disc    Family History  Problem Relation Age of Onset   Cancer Mother 52       leukemia    Heart disease Father 66       heart attack    Heart disease Sister     Current Outpatient Medications:    acetaminophen (TYLENOL) 325 MG tablet, Take 650 mg by mouth every 4 (four) hours as needed for moderate pain., Disp: , Rfl:    ALPRAZolam (XANAX) 0.5 MG tablet, Take 1 tablet (0.5 mg total) by mouth at bedtime as needed., Disp: 90 tablet, Rfl: 0   amoxicillin-clavulanate (AUGMENTIN) 875-125 MG tablet, Take 1 tablet by mouth 2 (two) times  daily for 7 days., Disp: 14 tablet, Rfl: 0   aspirin 81 MG tablet, Take 81 mg by mouth daily.  , Disp: , Rfl:    B Complex Vitamins (VITAMIN B-COMPLEX) TABS, Take 1 tablet by mouth daily., Disp: , Rfl:    CARTIA XT 300 MG 24 hr capsule, Take 1 capsule by mouth daily., Disp: , Rfl: 11   cholecalciferol (VITAMIN D3) 25 MCG (1000 UT) tablet, Take 2,000 Units by mouth daily., Disp: , Rfl:    dapagliflozin propanediol (FARXIGA) 10 MG TABS tablet, Take 1 tablet by mouth daily., Disp: , Rfl:    dofetilide (TIKOSYN) 250 MCG capsule, Take 1 capsule by mouth 2 (two) times daily., Disp: , Rfl:    doxycycline (VIBRA-TABS) 100 MG tablet, Take 1 tablet (100 mg total) by mouth 2 (two) times daily for 5 days., Disp: 10 tablet, Rfl: 0   furosemide (LASIX) 20 MG tablet, Take 60 mg by mouth daily., Disp: , Rfl:    glipiZIDE (GLUCOTROL) 10 MG tablet, Take 1 tablet (10 mg total) by mouth 2 (two) times daily before a meal. TAKE 1 TABLET BY MOUTH TWICE DAILY BEFORE MEALS, Disp: 180 tablet, Rfl: 1   glucose blood (RELION PRIME TEST) test strip, 1 each by Other route See admin instructions., Disp: , Rfl:    insulin degludec (TRESIBA) 100 UNIT/ML FlexTouch Pen, Inject  16 Units into the skin at bedtime., Disp: , Rfl:    Insulin Pen Needle (GLOBAL EASY GLIDE PEN NEEDLES) 32G X 4 MM MISC, Inject 1 each into the skin See admin instructions., Disp: 200 each, Rfl: 3   lansoprazole (PREVACID) 30 MG capsule, Take 30 mg by mouth daily.  , Disp: , Rfl:    metoprolol tartrate (LOPRESSOR) 50 MG tablet, Take 75 mg by mouth 2 (two) times daily. Changed to 75 mg, Disp: , Rfl:    mupirocin nasal ointment (BACTROBAN) 2 %, Place 1 application  into the nose as needed. Use one-half of tube in each nostril twice daily for five (5) days. After application, press sides of nose together and gently massage., Disp: , Rfl:    ondansetron (ZOFRAN-ODT) 4 MG disintegrating tablet, Place 1 tablet under your tongue every 8 hours as needed, Disp: , Rfl: 0    potassium chloride SA (KLOR-CON M) 20 MEQ tablet, Take 1 tablet (20 mEq total) by mouth daily., Disp: 5 tablet, Rfl: 0   Pramipexole Dihydrochloride 1.5 MG TB24, Take 1.5 mg by mouth., Disp: , Rfl:    warfarin (COUMADIN) 5 MG tablet, Take by mouth. 2 days a week taking 7mg , Disp: , Rfl:    Chlorpheniramine-DM 4-30 MG TABS, Take 1 tablet by mouth as needed., Disp: , Rfl:    fluticasone (FLONASE) 50 MCG/ACT nasal spray, Place 2 sprays into both nostrils daily. (Patient not taking: Reported on 06/25/2023), Disp: 16 g, Rfl: 2   spironolactone (ALDACTONE) 25 MG tablet, Take 0.5 tablets by mouth daily. (Patient not taking: Reported on 06/25/2023), Disp: , Rfl:  Allergies  Allergen Reactions   Statins Other (See Comments)    myalgia   Valdecoxib Rash         ROS: A complete ROS was performed with pertinent positives/negatives noted in the HPI. The remainder of the ROS are negative.    Objective:   Today's Vitals   06/25/23 1007  BP: 120/60  Pulse: 67  Temp: 98.2 F (36.8 C)  TempSrc: Temporal  SpO2: 98%  Weight: 227 lb (103 kg)  Height: 5' 6.5" (1.689 m)    GENERAL: Well-appearing, in NAD. Well nourished.  SKIN: Pink, warm and dry. No rash. NECK: Trachea midline. Full ROM w/o pain or tenderness. No lymphadenopathy.  RESPIRATORY: Chest wall symmetrical. Respirations even and non-labored. Faint crackle in bilateral lower lobes. No wheezing or rhonchi.  CARDIAC: S1, S2 present, regular rate and rhythm. Peripheral pulses 2+ bilaterally.  MSK: Muscle tone and strength appropriate for age.  EXTREMITIES: Without clubbing, cyanosis, or edema.  NEUROLOGIC: . Steady, even gait.  PSYCH/MENTAL STATUS: Alert, oriented x 3. Cooperative, appropriate mood and affect.    No results found for any visits on 06/25/23.    Assessment & Plan:  1. Pulmonary congestion - CBC w/Diff - Basic Metabolic Panel (BMET) - Pro b natriuretic peptide (BNP) - return to normal dosing of Lasix (60mg  PO daily)    2. Lower respiratory tract infection - amoxicillin-clavulanate (AUGMENTIN) 875-125 MG tablet; Take 1 tablet by mouth 2 (two) times daily for 7 days.  Dispense: 14 tablet; Refill: 0 - doxycycline (VIBRA-TABS) 100 MG tablet; Take 1 tablet (100 mg total) by mouth 2 (two) times daily for 5 days.  Dispense: 10 tablet; Refill: 0   Meds ordered this encounter  Medications   amoxicillin-clavulanate (AUGMENTIN) 875-125 MG tablet    Sig: Take 1 tablet by mouth 2 (two) times daily for 7 days.    Dispense:  14 tablet    Refill:  0    Order Specific Question:   Supervising Provider    Answer:   Garnette Gunner [[id number]]   doxycycline (VIBRA-TABS) 100 MG tablet    Sig: Take 1 tablet (100 mg total) by mouth 2 (two) times daily for 5 days.    Dispense:  10 tablet    Refill:  0    Order Specific Question:   Supervising Provider    Answer:   Garnette Gunner [[id number]]   Orders Placed This Encounter  Procedures   CBC w/Diff   Basic Metabolic Panel (BMET)   Pro b natriuretic peptide (BNP)   Lab Orders         CBC w/Diff         Basic Metabolic Panel (BMET)         Pro b natriuretic peptide (BNP)     No images are attached to the encounter or orders placed in the encounter.  Return for Scheduled Routine Office Visits and as needed.  Patient has follow up appt with me next week. She is aware if symptoms worsen to go to the ER.   Salvatore Decent, FNP

## 2023-06-25 ENCOUNTER — Encounter: Payer: Self-pay | Admitting: Internal Medicine

## 2023-06-25 ENCOUNTER — Ambulatory Visit (INDEPENDENT_AMBULATORY_CARE_PROVIDER_SITE_OTHER): Payer: PPO | Admitting: Internal Medicine

## 2023-06-25 VITALS — BP 120/60 | HR 67 | Temp 98.2°F | Ht 66.5 in | Wt 227.0 lb

## 2023-06-25 DIAGNOSIS — R0989 Other specified symptoms and signs involving the circulatory and respiratory systems: Secondary | ICD-10-CM

## 2023-06-25 DIAGNOSIS — J22 Unspecified acute lower respiratory infection: Secondary | ICD-10-CM

## 2023-06-25 LAB — CBC WITH DIFFERENTIAL/PLATELET
Basophils Absolute: 0.1 10*3/uL (ref 0.0–0.1)
Basophils Relative: 0.8 % (ref 0.0–3.0)
Eosinophils Absolute: 0.2 10*3/uL (ref 0.0–0.7)
Eosinophils Relative: 1.8 % (ref 0.0–5.0)
HCT: 42.7 % (ref 36.0–46.0)
Hemoglobin: 13.5 g/dL (ref 12.0–15.0)
Lymphocytes Relative: 14.4 % (ref 12.0–46.0)
Lymphs Abs: 1.6 10*3/uL (ref 0.7–4.0)
MCHC: 31.6 g/dL (ref 30.0–36.0)
MCV: 92.1 fl (ref 78.0–100.0)
Monocytes Absolute: 0.7 10*3/uL (ref 0.1–1.0)
Monocytes Relative: 5.9 % (ref 3.0–12.0)
Neutro Abs: 8.8 10*3/uL — ABNORMAL HIGH (ref 1.4–7.7)
Neutrophils Relative %: 77.1 % — ABNORMAL HIGH (ref 43.0–77.0)
Platelets: 170 10*3/uL (ref 150.0–400.0)
RBC: 4.63 Mil/uL (ref 3.87–5.11)
RDW: 18.8 % — ABNORMAL HIGH (ref 11.5–15.5)
WBC: 11.4 10*3/uL — ABNORMAL HIGH (ref 4.0–10.5)

## 2023-06-25 LAB — BASIC METABOLIC PANEL
BUN: 26 mg/dL — ABNORMAL HIGH (ref 6–23)
CO2: 31 mEq/L (ref 19–32)
Calcium: 8.9 mg/dL (ref 8.4–10.5)
Chloride: 98 mEq/L (ref 96–112)
Creatinine, Ser: 0.96 mg/dL (ref 0.40–1.20)
GFR: 57.42 mL/min — ABNORMAL LOW (ref >60.00)
Glucose, Bld: 281 mg/dL — ABNORMAL HIGH (ref 70–99)
Potassium: 4.4 mEq/L (ref 3.5–5.1)
Sodium: 139 mEq/L (ref 135–145)

## 2023-06-25 MED ORDER — AMOXICILLIN-POT CLAVULANATE 875-125 MG PO TABS: 1.0000 | ORAL_TABLET | Freq: Two times a day (BID) | ORAL | 0 refills | Status: DC

## 2023-06-25 MED ORDER — DOXYCYCLINE HYCLATE 100 MG PO TABS
100.0000 mg | ORAL_TABLET | Freq: Two times a day (BID) | ORAL | 0 refills | Status: DC
Start: 2023-06-25 — End: 2023-07-02

## 2023-06-26 LAB — PRO B NATRIURETIC PEPTIDE: NT-Pro BNP: 1351 pg/mL — ABNORMAL HIGH (ref 0–738)

## 2023-06-28 NOTE — Progress Notes (Signed)
[facility] [address] [place] [address] Dept: [phone] Dept Fax: [phone]    Subjective:   Tina Baker 1946/08/17 07/02/2023  Chief Complaint  Patient presents with   Follow-up    HPI: Tina Baker presents today for re-assessment and management of chronic medical conditions.  DIABETES MELLITUS: Tina Baker presents for the medical management of diabetes.  Current diabetes medication regimen: Farxiga 10mg   Glipizide 10mg  BID, Tresiba 16 units Patient is not adhering to a diabetic diet.  Patient is  checking BS regularly. Avg: 150  Patient is  checking their feet regularly.  No open wounds or ulcers on feet.  Patient has had 2 steroid injections with ortho over the past couple months which have raised blood glucose levels Lab Results  Component Value Date   HGBA1C 9.2 (A) 07/02/2023    No foot exam found Lab Results  Component Value Date   MICROALBUR 1.8 03/31/2023   HYPERTENSION: Tina Baker presents for the medical management of hypertension.  Patient's current hypertension medication regimen is: Metoprolol XL 50mg   Patient is  currently taking prescribed medications for HTN.  Patient is  adhering to low salt diet.  Denies headache, dizziness, CP, SHOB, vision changes.   BP Readings from Last 3 Encounters:  07/02/23 128/64  06/25/23 120/60  06/22/23 120/66   CHF: Tina Baker is here for the medical management of congestive heart failure.  Is managed by cardiology.  Current medication: Spironolactone 12.5mg , Lasix 60mg  PO daily, Entresto 24-26mg ,  Patient does  adhere to a low sodium diet.  Patient does  check daily weight.  Denies SHOB, leg swelling. Denies >3lb gain overnight or >5lbs in couple days. Wt Readings from Last 3 Encounters:  07/02/23 230 lb 9.6 oz (104.6 kg)  06/25/23 227 lb (103 kg)  06/22/23 229 lb 3.2 oz (104 kg)    The following portions of  the patient's history were reviewed and updated as appropriate: past medical history, past surgical history, family history, social history, allergies, medications, and problem list.   Patient Active Problem List   Diagnosis Date Noted   Impaired functional mobility, balance, gait, and endurance 03/31/2023   Irritant contact dermatitis 03/31/2023   Arthralgia of right wrist 03/31/2023   Lumbar radiculopathy 02/22/2023   Acute idiopathic gout of right foot 02/02/2023   Chronic bilateral low back pain without sciatica 01/05/2023   Bilateral hip pain 01/05/2023   Impaired ambulation 01/05/2023   Weakness of both lower extremities 01/05/2023   Tinea unguium 12/01/2022   Heart failure (HCC) 04/09/2022   Acute on chronic heart failure with preserved ejection fraction (HFpEF) (HCC) 04/01/2022   Cryptogenic cirrhosis (HCC) 02/19/2022   Hypoglycemia 11/13/2021   Chronic respiratory failure with hypoxia (HCC) 11/12/2021   CAP (community acquired pneumonia) 11/12/2021   Subtherapeutic anticoagulation 11/12/2021   TIA (transient ischemic attack) 11/07/2021   Aspiration pneumonia (HCC) 09/17/2021   RSV (respiratory syncytial virus infection) 09/17/2021   Senile nuclear sclerosis 03/18/2021   Hypertrophy of bone, right ankle and foot 01/24/2021   Restrictive lung disease 05/17/2020   History of colon polyps 12/11/2019   Hyperbilirubinemia 12/11/2019   Thrombocytopenia (HCC) 12/11/2019   Pulmonary hypertension (HCC) 11/24/2019   Bilateral pleural effusion 10/25/2019   Primary osteoarthritis of left hip 06/14/2019   Trochanteric bursitis of left hip 06/14/2019   Intolerance of continuous positive airway pressure (CPAP) ventilation 05/03/2019   Primary osteoarthritis of left knee 05/01/2019   Primary osteoarthritis  of right knee 05/01/2019   Bilateral carpal tunnel syndrome 05/01/2019   Trigger thumb of left hand 05/01/2019   ANA positive 02/23/2019   Rheumatoid factor positive 02/23/2019    GI bleed 02/17/2019   Generalized anxiety disorder 11/27/2018   DNR (do not resuscitate) 11/27/2018   Elevated d-dimer 11/27/2018   Hyponatremia 11/27/2018   Major depressive disorder with current active episode 11/27/2018   Obesity (BMI 30-39.9) 11/27/2018   Essential hypertension 11/27/2018   Acute hypoxemic respiratory failure (HCC) 11/27/2018   Obstructive sleep apnea (adult) (pediatric) 08/12/2018   Acute pulmonary edema (HCC) 07/08/2018   Recurrent major depressive disorder, in partial remission (HCC) 04/13/2018   Varicose veins of left lower extremity with pain 04/13/2018   Constipation 02/19/2018   Long term (current) use of anticoagulants 12/31/2015   Acromioclavicular joint arthritis 12/31/2015   Subacromial bursitis 12/31/2015   Allergic rhinitis 11/13/2015   Benign essential hypertension 11/13/2015   Chronic tension-type headache, not intractable 11/13/2015   DDD (degenerative disc disease), lumbosacral 11/13/2015   Gastroesophageal reflux disease without esophagitis 11/13/2015   Paroxysmal atrial fibrillation (HCC) 11/13/2015   RLS (restless legs syndrome) 11/13/2015   Vitamin D deficiency 11/13/2015   Insomnia 11/13/2015   Class 1 obesity due to excess calories with serious comorbidity and body mass index (BMI) of 33.0 to 33.9 in adult 11/13/2015   Contracture of tendon sheath 11/13/2015   Spinal stenosis of lumbar region without neurogenic claudication 11/13/2015   Gout, arthritis 11/13/2015   Iron deficiency anemia due to chronic blood loss 11/13/2015   Lumbar herniated disc 11/13/2015   Rotator cuff tendinitis 11/13/2015   Swelling 09/30/2015   Chest discomfort 07/18/2015   Hospital discharge follow-up 07/04/2015   Leukocytosis 06/27/2015   Anemia, unspecified 06/26/2015   Type 2 diabetes mellitus, without long-term current use of insulin (HCC) 06/25/2015   Venous insufficiency, peripheral 01/09/2015   Spondylolisthesis of lumbar region 11/03/2012   Past  Medical History:  Diagnosis Date   Arthritis    Atrial fibrillation (HCC)    Diabetes mellitus    Hyperlipidemia    Hypertension    Past Surgical History:  Procedure Laterality Date   ABDOMINAL HYSTERECTOMY  2001   BUNIONECTOMY Bilateral 1979   GALLBLADDER SURGERY     SPINE SURGERY  2010   protruding disc    Family History  Problem Relation Age of Onset   Cancer Mother 28       leukemia    Heart disease Father 53       heart attack    Heart disease Sister     Current Outpatient Medications:    acetaminophen (TYLENOL) 325 MG tablet, Take 650 mg by mouth every 4 (four) hours as needed for moderate pain., Disp: , Rfl:    ALPRAZolam (XANAX) 0.5 MG tablet, Take 1 tablet (0.5 mg total) by mouth at bedtime as needed., Disp: 90 tablet, Rfl: 0   aspirin 81 MG tablet, Take 81 mg by mouth daily.  , Disp: , Rfl:    B Complex Vitamins (VITAMIN B-COMPLEX) TABS, Take 1 tablet by mouth daily., Disp: , Rfl:    Chlorpheniramine-DM 4-30 MG TABS, Take 1 tablet by mouth as needed., Disp: , Rfl:    cholecalciferol (VITAMIN D3) 25 MCG (1000 UT) tablet, Take 2,000 Units by mouth daily., Disp: , Rfl:    dapagliflozin propanediol (FARXIGA) 10 MG TABS tablet, Take 1 tablet by mouth daily., Disp: , Rfl:    diltiazem (CARDIZEM CD) 360 MG 24 hr capsule, JF,  Disp: , Rfl:    dofetilide (TIKOSYN) 250 MCG capsule, Take 1 capsule by mouth 2 (two) times daily., Disp: , Rfl:    fluticasone (FLONASE) 50 MCG/ACT nasal spray, Place 2 sprays into both nostrils daily., Disp: 16 g, Rfl: 2   furosemide (LASIX) 20 MG tablet, Take 60 mg by mouth daily., Disp: , Rfl:    glipiZIDE (GLUCOTROL) 10 MG tablet, Take 1 tablet (10 mg total) by mouth 2 (two) times daily before a meal. TAKE 1 TABLET BY MOUTH TWICE DAILY BEFORE MEALS, Disp: 180 tablet, Rfl: 1   glucose blood (RELION PRIME TEST) test strip, 1 each by Other route See admin instructions., Disp: , Rfl:    lansoprazole (PREVACID) 30 MG capsule, Take 30 mg by mouth daily.   , Disp: , Rfl:    metoprolol succinate (TOPROL-XL) 50 MG 24 hr tablet, Take 1 tablet by mouth daily., Disp: , Rfl:    mupirocin nasal ointment (BACTROBAN) 2 %, Place 1 application  into the nose as needed. Use one-half of tube in each nostril twice daily for five (5) days. After application, press sides of nose together and gently massage., Disp: , Rfl:    ondansetron (ZOFRAN-ODT) 4 MG disintegrating tablet, Place 1 tablet under your tongue every 8 hours as needed, Disp: , Rfl: 0   Pramipexole Dihydrochloride 1.5 MG TB24, Take 1.5 mg by mouth., Disp: , Rfl:    spironolactone (ALDACTONE) 25 MG tablet, Take 0.5 tablets by mouth daily., Disp: , Rfl:    warfarin (COUMADIN) 5 MG tablet, Take by mouth. 2 days a week taking 7mg , Disp: , Rfl:    insulin degludec (TRESIBA) 100 UNIT/ML FlexTouch Pen, Inject 18 Units into the skin at bedtime., Disp: 15 mL, Rfl: 1   Insulin Pen Needle (GLOBAL EASY GLIDE PEN NEEDLES) 32G X 4 MM MISC, Use as directed to inject insulin once daily., Disp: 200 each, Rfl: 3   sacubitril-valsartan (ENTRESTO) 24-26 MG, Take 1 tablet by mouth 2 (two) times daily., Disp: , Rfl:  Allergies  Allergen Reactions   Statins Other (See Comments)    myalgia   Valdecoxib Rash          ROS: A complete ROS was performed with pertinent positives/negatives noted in the HPI. The remainder of the ROS are negative.    Objective:   Today's Vitals   07/02/23 0907  BP: 128/64  Pulse: 73  Temp: 97.9 F (36.6 C)  TempSrc: Temporal  SpO2: 94%  Weight: 230 lb 9.6 oz (104.6 kg)  Height: 5' 6.5" (1.689 m)   GENERAL: Well-appearing, in NAD. Well nourished.  SKIN: Pink, warm and dry.  NECK: Trachea midline. Full ROM w/o pain or tenderness. No lymphadenopathy.  RESPIRATORY: Chest wall symmetrical. Respirations even and non-labored. Breath sounds clear to auscultation bilaterally.  CARDIAC: S1, S2 present, regular rate and rhythm. Peripheral pulses 2+ bilaterally.  EXTREMITIES: Without  clubbing, cyanosis, or edema.  PSYCH/MENTAL STATUS: Alert, oriented x 3. Cooperative, appropriate mood and affect.   There are no preventive care reminders to display for this patient.   Results for orders placed or performed in visit on 07/02/23  POCT glycosylated hemoglobin (Hb A1C)  Result Value Ref Range   Hemoglobin A1C 9.2 (A) 4.0 - 5.6 %   HbA1c POC (<> result, manual entry)     HbA1c, POC (prediabetic range)     HbA1c, POC (controlled diabetic range)      The ASCVD Risk score (Arnett DK, et al., 2019) failed to calculate  for the following reasons:   The patient has a prior MI or stroke diagnosis     Assessment & Plan:  1. Benign essential hypertension - stable, continue current medication regimen   2. Type 2 diabetes mellitus with hyperglycemia, with long-term current use of insulin (HCC) - POCT glycosylated hemoglobin (Hb A1C) - Insulin Pen Needle (GLOBAL EASY GLIDE PEN NEEDLES) 32G X 4 MM MISC; Use as directed to inject insulin once daily.  Dispense: 200 each; Refill: 3 - insulin degludec (TRESIBA) 100 UNIT/ML FlexTouch Pen; Inject 18 Units into the skin at bedtime.  Dispense: 15 mL; Refill: 1 - increasing Tresiba from 16 units to 18 units.  - advised patient to check CBG in AM and PM. If AM fasting remains > 150, notify PCP and will increase insulin dose.   3. Chronic heart failure, unspecified heart failure type (HCC) - continue daily weight checks, low sodium diet  - continue current medication regimen and routine follow ups with cardiology   Orders Placed This Encounter  Procedures   POCT glycosylated hemoglobin (Hb A1C)   No images are attached to the encounter or orders placed in the encounter. Meds ordered this encounter  Medications   Insulin Pen Needle (GLOBAL EASY GLIDE PEN NEEDLES) 32G X 4 MM MISC    Sig: Use as directed to inject insulin once daily.    Dispense:  200 each    Refill:  3    Order Specific Question:   Supervising Provider    Answer:    Garnette Gunner [[id number]]   insulin degludec (TRESIBA) 100 UNIT/ML FlexTouch Pen    Sig: Inject 18 Units into the skin at bedtime.    Dispense:  15 mL    Refill:  1    Order Specific Question:   Supervising Provider    Answer:   Garnette Gunner [[id number]]    Return in about 3 months (around 10/02/2023) for Chronic Condition follow up.   Salvatore Decent, FNP

## 2023-07-02 ENCOUNTER — Ambulatory Visit (INDEPENDENT_AMBULATORY_CARE_PROVIDER_SITE_OTHER): Payer: PPO | Admitting: Internal Medicine

## 2023-07-02 ENCOUNTER — Encounter: Payer: Self-pay | Admitting: Internal Medicine

## 2023-07-02 VITALS — BP 128/64 | HR 73 | Temp 97.9°F | Ht 66.5 in | Wt 230.6 lb

## 2023-07-02 DIAGNOSIS — I509 Heart failure, unspecified: Secondary | ICD-10-CM

## 2023-07-02 DIAGNOSIS — Z794 Long term (current) use of insulin: Secondary | ICD-10-CM | POA: Diagnosis not present

## 2023-07-02 DIAGNOSIS — I10 Essential (primary) hypertension: Secondary | ICD-10-CM

## 2023-07-02 DIAGNOSIS — E1165 Type 2 diabetes mellitus with hyperglycemia: Secondary | ICD-10-CM

## 2023-07-02 LAB — POCT GLYCOSYLATED HEMOGLOBIN (HGB A1C): Hemoglobin A1C: 9.2 % — AB (ref 4.0–5.6)

## 2023-07-02 MED ORDER — GLOBAL EASY GLIDE PEN NEEDLES 32G X 4 MM MISC
3 refills | Status: DC
Start: 2023-07-02 — End: 2024-08-23

## 2023-07-02 MED ORDER — INSULIN DEGLUDEC 100 UNIT/ML ~~LOC~~ SOPN
18.0000 [IU] | PEN_INJECTOR | Freq: Every day | SUBCUTANEOUS | 1 refills | Status: DC
Start: 2023-07-02 — End: 2023-12-03

## 2023-07-02 NOTE — Patient Instructions (Signed)
INSULIN:  Inject 18 units into the skin once at bedtime.   Check blood sugar in morning and at bedtime. Notify me if blood sugars are above 150 in morning time consistently

## 2023-07-27 ENCOUNTER — Ambulatory Visit (INDEPENDENT_AMBULATORY_CARE_PROVIDER_SITE_OTHER): Payer: PPO

## 2023-07-27 ENCOUNTER — Encounter: Payer: Self-pay | Admitting: Internal Medicine

## 2023-07-27 ENCOUNTER — Ambulatory Visit (INDEPENDENT_AMBULATORY_CARE_PROVIDER_SITE_OTHER): Payer: PPO | Admitting: Internal Medicine

## 2023-07-27 VITALS — BP 98/60 | HR 73 | Temp 97.9°F | Ht 66.5 in | Wt 226.0 lb

## 2023-07-27 DIAGNOSIS — R0781 Pleurodynia: Secondary | ICD-10-CM

## 2023-07-27 DIAGNOSIS — Z794 Long term (current) use of insulin: Secondary | ICD-10-CM

## 2023-07-27 DIAGNOSIS — E1165 Type 2 diabetes mellitus with hyperglycemia: Secondary | ICD-10-CM

## 2023-07-27 DIAGNOSIS — I509 Heart failure, unspecified: Secondary | ICD-10-CM

## 2023-07-27 NOTE — Patient Instructions (Addendum)
Go for xray of ribs  I will be in touch with you about results  Call Gabe about getting samples for farxiga

## 2023-07-27 NOTE — Progress Notes (Signed)
[facility] [address] [place] [address] Dept: [phone] Dept Fax: [phone]  Acute Care Office Visit  Subjective:   Tina Baker 1945-12-30 07/27/2023  Chief Complaint  Patient presents with   Flank Pain    Taking tylenol and mortran happened before last office visit    HPI: Tina Baker presents complaining of Right sided rib cage pain onset a couple weeks ago. She tripped over her oxygen cord, went to catch herself and hit the right side of her rib cage on the corner of the wall. Initially had pain, then pain began to resolve, but recently returned. Associated bruising to area.   She reports some SHOB, does have CHF. Was seen by cardiology 1 day ago, BNP lab drawn - awaiting results (unable to see on Epic). Patient is contacting cardiology to find out about results. She is compliant with diuretics.   Wt Readings from Last 3 Encounters:  07/27/23 226 lb (102.5 kg)  07/02/23 230 lb 9.6 oz (104.6 kg)  06/25/23 227 lb (103 kg)    Patient also states she just ran out of her Marcelline Deist and is also in donut hole with her insurance. She cannot afford the prescription. She is a T2DM and heart failure patient. She has contacted her heart failure doctor about her medication, but has not heard back. She needs assistance getting medication.    The following portions of the patient's history were reviewed and updated as appropriate: past medical history, past surgical history, family history, social history, allergies, medications, and problem list.   Patient Active Problem List   Diagnosis Date Noted   Impaired functional mobility, balance, gait, and endurance 03/31/2023   Irritant contact dermatitis 03/31/2023   Arthralgia of right wrist 03/31/2023   Lumbar radiculopathy 02/22/2023   Acute idiopathic gout of right foot 02/02/2023   Chronic bilateral low back pain without sciatica 01/05/2023   Bilateral hip pain  01/05/2023   Impaired ambulation 01/05/2023   Weakness of both lower extremities 01/05/2023   Tinea unguium 12/01/2022   Heart failure (HCC) 04/09/2022   Acute on chronic heart failure with preserved ejection fraction (HFpEF) (HCC) 04/01/2022   Cryptogenic cirrhosis (HCC) 02/19/2022   Hypoglycemia 11/13/2021   Chronic respiratory failure with hypoxia (HCC) 11/12/2021   CAP (community acquired pneumonia) 11/12/2021   Subtherapeutic anticoagulation 11/12/2021   TIA (transient ischemic attack) 11/07/2021   Aspiration pneumonia (HCC) 09/17/2021   RSV (respiratory syncytial virus infection) 09/17/2021   Senile nuclear sclerosis 03/18/2021   Hypertrophy of bone, right ankle and foot 01/24/2021   Restrictive lung disease 05/17/2020   History of colon polyps 12/11/2019   Hyperbilirubinemia 12/11/2019   Thrombocytopenia (HCC) 12/11/2019   Pulmonary hypertension (HCC) 11/24/2019   Bilateral pleural effusion 10/25/2019   Primary osteoarthritis of left hip 06/14/2019   Trochanteric bursitis of left hip 06/14/2019   Intolerance of continuous positive airway pressure (CPAP) ventilation 05/03/2019   Primary osteoarthritis of left knee 05/01/2019   Primary osteoarthritis of right knee 05/01/2019   Bilateral carpal tunnel syndrome 05/01/2019   Trigger thumb of left hand 05/01/2019   ANA positive 02/23/2019   Rheumatoid factor positive 02/23/2019   GI bleed 02/17/2019   Generalized anxiety disorder 11/27/2018   DNR (do not resuscitate) 11/27/2018   Elevated d-dimer 11/27/2018   Hyponatremia 11/27/2018   Major depressive disorder with current active episode 11/27/2018   Obesity (BMI 30-39.9) 11/27/2018   Essential hypertension 11/27/2018   Acute hypoxemic respiratory failure (HCC)  11/27/2018   Obstructive sleep apnea (adult) (pediatric) 08/12/2018   Acute pulmonary edema (HCC) 07/08/2018   Recurrent major depressive disorder, in partial remission (HCC) 04/13/2018   Varicose veins of left  lower extremity with pain 04/13/2018   Constipation 02/19/2018   Long term (current) use of anticoagulants 12/31/2015   Acromioclavicular joint arthritis 12/31/2015   Subacromial bursitis 12/31/2015   Allergic rhinitis 11/13/2015   Benign essential hypertension 11/13/2015   Chronic tension-type headache, not intractable 11/13/2015   DDD (degenerative disc disease), lumbosacral 11/13/2015   Gastroesophageal reflux disease without esophagitis 11/13/2015   Paroxysmal atrial fibrillation (HCC) 11/13/2015   RLS (restless legs syndrome) 11/13/2015   Vitamin D deficiency 11/13/2015   Insomnia 11/13/2015   Class 1 obesity due to excess calories with serious comorbidity and body mass index (BMI) of 33.0 to 33.9 in adult 11/13/2015   Contracture of tendon sheath 11/13/2015   Spinal stenosis of lumbar region without neurogenic claudication 11/13/2015   Gout, arthritis 11/13/2015   Iron deficiency anemia due to chronic blood loss 11/13/2015   Lumbar herniated disc 11/13/2015   Rotator cuff tendinitis 11/13/2015   Swelling 09/30/2015   Chest discomfort 07/18/2015   Hospital discharge follow-up 07/04/2015   Leukocytosis 06/27/2015   Anemia, unspecified 06/26/2015   Type 2 diabetes mellitus, without long-term current use of insulin (HCC) 06/25/2015   Venous insufficiency, peripheral 01/09/2015   Spondylolisthesis of lumbar region 11/03/2012   Past Medical History:  Diagnosis Date   Arthritis    Atrial fibrillation (HCC)    Diabetes mellitus    Hyperlipidemia    Hypertension    Past Surgical History:  Procedure Laterality Date   ABDOMINAL HYSTERECTOMY  2001   BUNIONECTOMY Bilateral 1979   GALLBLADDER SURGERY     SPINE SURGERY  2010   protruding disc    Family History  Problem Relation Age of Onset   Cancer Mother 104       leukemia    Heart disease Father 20       heart attack    Heart disease Sister     Current Outpatient Medications:    acetaminophen (TYLENOL) 325 MG tablet,  Take 650 mg by mouth every 4 (four) hours as needed for moderate pain., Disp: , Rfl:    ALPRAZolam (XANAX) 0.5 MG tablet, Take 1 tablet (0.5 mg total) by mouth at bedtime as needed., Disp: 90 tablet, Rfl: 0   aspirin 81 MG tablet, Take 81 mg by mouth daily.  , Disp: , Rfl:    B Complex Vitamins (VITAMIN B-COMPLEX) TABS, Take 1 tablet by mouth daily., Disp: , Rfl:    Chlorpheniramine-DM 4-30 MG TABS, Take 1 tablet by mouth as needed., Disp: , Rfl:    cholecalciferol (VITAMIN D3) 25 MCG (1000 UT) tablet, Take 2,000 Units by mouth daily., Disp: , Rfl:    diltiazem (CARDIZEM CD) 360 MG 24 hr capsule, JF, Disp: , Rfl:    dofetilide (TIKOSYN) 250 MCG capsule, Take 1 capsule by mouth 2 (two) times daily., Disp: , Rfl:    fluticasone (FLONASE) 50 MCG/ACT nasal spray, Place 2 sprays into both nostrils daily., Disp: 16 g, Rfl: 2   furosemide (LASIX) 20 MG tablet, Take 60 mg by mouth daily., Disp: , Rfl:    glipiZIDE (GLUCOTROL) 10 MG tablet, Take 1 tablet (10 mg total) by mouth 2 (two) times daily before a meal. TAKE 1 TABLET BY MOUTH TWICE DAILY BEFORE MEALS, Disp: 180 tablet, Rfl: 1   glucose blood (RELION PRIME TEST)  test strip, 1 each by Other route See admin instructions., Disp: , Rfl:    insulin degludec (TRESIBA) 100 UNIT/ML FlexTouch Pen, Inject 18 Units into the skin at bedtime., Disp: 15 mL, Rfl: 1   Insulin Pen Needle (GLOBAL EASY GLIDE PEN NEEDLES) 32G X 4 MM MISC, Use as directed to inject insulin once daily., Disp: 200 each, Rfl: 3   lansoprazole (PREVACID) 30 MG capsule, Take 30 mg by mouth daily.  , Disp: , Rfl:    metoprolol succinate (TOPROL-XL) 50 MG 24 hr tablet, Take 1 tablet by mouth daily., Disp: , Rfl:    mupirocin nasal ointment (BACTROBAN) 2 %, Place 1 application  into the nose as needed. Use one-half of tube in each nostril twice daily for five (5) days. After application, press sides of nose together and gently massage., Disp: , Rfl:    ondansetron (ZOFRAN-ODT) 4 MG disintegrating  tablet, Place 1 tablet under your tongue every 8 hours as needed, Disp: , Rfl: 0   Pramipexole Dihydrochloride 1.5 MG TB24, Take 1.5 mg by mouth., Disp: , Rfl:    sacubitril-valsartan (ENTRESTO) 24-26 MG, Take 1 tablet by mouth 2 (two) times daily., Disp: , Rfl:    spironolactone (ALDACTONE) 25 MG tablet, Take 0.5 tablets by mouth daily., Disp: , Rfl:    warfarin (COUMADIN) 5 MG tablet, Take by mouth. 2 days a week taking 7mg , Disp: , Rfl:    dapagliflozin propanediol (FARXIGA) 10 MG TABS tablet, Take 1 tablet by mouth daily. (Patient not taking: Reported on 07/27/2023), Disp: , Rfl:  Allergies  Allergen Reactions   Statins Other (See Comments)    myalgia   Valdecoxib Rash          ROS: A complete ROS was performed with pertinent positives/negatives noted in the HPI. The remainder of the ROS are negative.    Objective:   Today's Vitals   07/27/23 1442  BP: 98/60  Pulse: 73  Temp: 97.9 F (36.6 C)  TempSrc: Temporal  SpO2: 96%  Weight: 226 lb (102.5 kg)  Height: 5' 6.5" (1.689 m)    GENERAL: Well-appearing, in NAD. Well nourished.  SKIN: Pink, warm and dry. No rash, lesion, ulceration. Ecchymosis to R. Rib cage RESPIRATORY: Chest wall symmetrical. Respirations even and non-labored. Breath sounds clear to auscultation bilaterally.  CARDIAC: S1, S2 present, regular rate and rhythm. Peripheral pulses 2+ bilaterally.  EXTREMITIES: Without clubbing, cyanosis, or edema.  NEUROLOGIC:  Steady, even gait.  PSYCH/MENTAL STATUS: Alert, oriented x 3. Cooperative, appropriate mood and affect.    No results found for any visits on 07/27/23.    Assessment & Plan:  1. Rib pain - DG Ribs Unilateral Right; Future - awaiting Rib XR to determine further treatment. Patient aware.  2. Type 2 diabetes mellitus with hyperglycemia, with long-term current use of insulin (HCC) - AMB Referral to Pharmacy Medication Management  3. Chronic heart failure, unspecified heart failure type (HCC) - AMB  Referral to Pharmacy Medication Management    Orders Placed This Encounter  Procedures   DG Ribs Unilateral Right    Standing Status:   Future    Number of Occurrences:   1    Standing Expiration Date:   07/26/2024    Order Specific Question:   Reason for Exam (SYMPTOM  OR DIAGNOSIS REQUIRED)    Answer:   fall injuring right side of rib cage    Order Specific Question:   Preferred imaging location?    Answer:   Internal  AMB Referral to Pharmacy Medication Management    Referral Priority:   Routine    Referral Type:   Consultation    Referral Reason:   Pharmacy Medication Management    Number of Visits Requested:   1   Lab Orders  No laboratory test(s) ordered today   No images are attached to the encounter or orders placed in the encounter.  Return for Scheduled Routine Office Visits and as needed.   Salvatore Decent, FNP

## 2023-07-28 ENCOUNTER — Encounter: Payer: Self-pay | Admitting: Internal Medicine

## 2023-07-28 ENCOUNTER — Telehealth: Payer: Self-pay

## 2023-07-28 NOTE — Progress Notes (Signed)
Care Guide Note  07/28/2023 Name: Tina Baker MRN: [id number] DOB: 01-08-1946  Referred by: Salvatore Decent, FNP Reason for referral : Care Coordination (Outreach to schedule with Pharm d )   Tina Baker is a 77 y.o. year old female who is a primary care patient of Salvatore Decent, FNP. Tina Baker was referred to the pharmacist for assistance related to DM.    Successful contact was made with the patient to discuss pharmacy services including being ready for the pharmacist to call at least 5 minutes before the scheduled appointment time, to have medication bottles and any blood sugar or blood pressure readings ready for review. The patient agreed to meet with the pharmacist via with the pharmacist via telephone visit on (date/time).  07/29/2023  [name], RMA Care Guide [facility]  [place], [place] [address] Direct Dial: [phone] [email or web address]

## 2023-07-29 ENCOUNTER — Other Ambulatory Visit: Payer: PPO

## 2023-08-02 ENCOUNTER — Encounter: Payer: Self-pay | Admitting: Internal Medicine

## 2023-08-04 NOTE — Progress Notes (Signed)
   08/04/2023  Patient ID: Tina Baker, female   DOB: 1946/10/08, 77 y.o.   MRN: [id number]  PCP, Salvatore Decent, FNP, agreeable to initiating Ozempic therapy since Farxiga and [place] will not be affordable for patient; and she qualifies for Novo PAP.  Coordinating with medication assistance team to initiate Novo PAP application for Ozempic 0.25/0.5mg .  I recommend Ozempic 0.25mg  weekly for 4 weeks, then increasing to Ozempic 0.5mg  weekly.  Sending patient a MyChart message to notify provider agrees and we will be working on PAP.  Will schedule a telephone visit once medication arrives to counsel on storage and administration.  Lenna Gilford, PharmD, DPLA

## 2023-08-05 MED ORDER — LANSOPRAZOLE 30 MG PO CPDR: 30.0000 mg | DELAYED_RELEASE_CAPSULE | Freq: Every day | ORAL | 1 refills | Status: DC

## 2023-08-05 NOTE — Telephone Encounter (Signed)
Please review and advise. Thanks. Dm/cma  

## 2023-08-08 ENCOUNTER — Other Ambulatory Visit: Payer: Self-pay | Admitting: Internal Medicine

## 2023-08-10 ENCOUNTER — Other Ambulatory Visit (HOSPITAL_COMMUNITY): Payer: Self-pay

## 2023-08-10 ENCOUNTER — Telehealth: Payer: Self-pay

## 2023-08-10 NOTE — Telephone Encounter (Signed)
FYI

## 2023-08-10 NOTE — Telephone Encounter (Signed)
PAP: Application for Ozempic has been submitted to PAP Companies: [employer], online    PLEASE BE ADVISED

## 2023-08-10 NOTE — Telephone Encounter (Signed)
-----   Message from Lenna Gilford sent at 08/04/2023  6:17 PM EDT ----- Good evening,  Patient qualifies for Novo PAP for Ozempic 0.25/0.5mg  based on HHI.  Could you all please assist with the initiation of this application process?  Thank you!  Lenna Gilford, PharmD, DPLA

## 2023-08-18 NOTE — Progress Notes (Signed)
   08/18/2023  Patient ID: Tina Baker, female   DOB: May 21, 1946, 77 y.o.   MRN: [id number]  Contacted Novo PAP to check on processing of application for Ozempic 0.25/0.5mg .  Patient was approved as of 10/9, and the medication should arrive at PCP office within 10-14 business days of this date.  Sending MyChart message to notify patient and schedule telephone visit.  Lenna Gilford, PharmD, DPLA

## 2023-08-20 NOTE — Telephone Encounter (Signed)
Called pt at [phone] to notify her that [facility] has received 4 boxes of Ozempic for her and in the refrigerator at our office. Pt requests that we notify Elnita Maxwell and ask if she should pick up prior to her appt 10/24. Please notify pt.

## 2023-08-23 ENCOUNTER — Other Ambulatory Visit: Payer: Self-pay

## 2023-08-23 NOTE — Progress Notes (Signed)
   08/23/2023  Patient ID: Tina Baker, female   DOB: January 27, 1946, 77 y.o.   MRN: [id number]  S/O Telephone visit to discuss initiation of Ozempic 0.25mg  weekly  Diabetes Management Plan -Current medications:  Jardiance 10mg  daily, glipizide 10mg  BID, Tresiba 20 units at bedtime -Patient picked up Ozempic 0.25/0.5mg  received from [employer] PAP and plans to start therapy with 0.25mg  weekly today -Discussed administration and potential side effects of therapy and answered patient questions -FBG currently 96-118 -No s/sx of hypoglycemia  A/P  Diabetes Management Plan -Ozempic 0.25mg  weekly x4 weeks, then we can increase to 0.5mg  if tolerating well -Continue current therapy but decrease Tresiba dose by 20% to decrease risk of hypoglycemia -Continue to monitor daily FBG  Follow-up:  4 weeks  Lenna Gilford, PharmD, DPLA

## 2023-08-26 ENCOUNTER — Other Ambulatory Visit: Payer: PPO

## 2023-08-27 NOTE — Telephone Encounter (Signed)
PAP: Patient assistance application for Ozempic has been approved by PAP Companies: [employer] from 08/11/2023 to 08/05/2024. Medication should be delivered to PAP Delivery: Provider's office For further shipping updates, please contact [employer] at [phone] Pt ID is: [id number]

## 2023-09-08 ENCOUNTER — Ambulatory Visit: Payer: PPO | Admitting: Internal Medicine

## 2023-09-16 LAB — HM MAMMOGRAPHY

## 2023-09-16 LAB — HM DEXA SCAN

## 2023-09-17 ENCOUNTER — Telehealth: Payer: Self-pay | Admitting: Internal Medicine

## 2023-09-17 NOTE — Telephone Encounter (Signed)
Patient informed of message verbalized understanding  ?

## 2023-09-17 NOTE — Telephone Encounter (Signed)
Please advise in PCP absence.  

## 2023-09-17 NOTE — Telephone Encounter (Signed)
Pt would like to know if she should come get her A1C tested before she starts the prednisone prescribed by her dermatologist. She will wait till she hears from you.  Tina Baker [phone]

## 2023-09-20 ENCOUNTER — Encounter: Payer: Self-pay | Admitting: Internal Medicine

## 2023-09-20 ENCOUNTER — Other Ambulatory Visit: Payer: Self-pay

## 2023-09-21 ENCOUNTER — Encounter: Payer: Self-pay | Admitting: Internal Medicine

## 2023-09-22 NOTE — Progress Notes (Signed)
   09/20/23  Patient ID: Tina Baker, female   DOB: 09-17-1946, 77 y.o.   MRN: [id number]  S/O Patient outreach to follow-up on control of T2DM  Diabetes Management -Current medications:  glipizide 10mg  BID, Tresiba 16 units daily, Ozempic 0.25mg  weekly  -Patient endorses taking 2 doses of Ozempic 0.25mg  weekly so far -Currently taking a steroid taper, which has increased BG readings- FBG this morning 207 -Will compete steroid taper in 12 days -Increased Tresiba to 18 units based on elevated BG readings  A/P  Diabetes Management -Continue Tresiba 18 units while on steroid taper -After 2 more doses of Ozempic 0.25mg , increase to Ozempic 0.5mg  weekly- goal to titrate dose every 4 weeks to final dose of 2mg  in hopes of stopping [place] -Patient sees PCP 12/2 and will be due for follow-up A1c  Follow-up:  2 weeks  Lenna Gilford, PharmD, DPLA

## 2023-09-24 HISTORY — PX: LEG SURGERY: SHX1003

## 2023-10-04 ENCOUNTER — Other Ambulatory Visit: Payer: Self-pay

## 2023-10-04 ENCOUNTER — Ambulatory Visit: Payer: PPO | Admitting: Internal Medicine

## 2023-10-05 ENCOUNTER — Telehealth: Payer: Self-pay

## 2023-10-05 NOTE — Transitions of Care (Post Inpatient/ED Visit) (Signed)
   10/05/2023  Name: Tina Baker MRN: [id number] DOB: 1946/03/16  Today's TOC FU Call Status: Today's TOC FU Call Status:: Successful TOC FU Call Completed TOC FU Call Complete Date: 10/05/23 Patient's Name and Date of Birth confirmed.  Transition Care Management Follow-up Telephone Call Date of Discharge: 10/01/23 Discharge Facility: Other [facility]) Name of Other ([facility]) Discharge Facility: [facility] Type of Discharge: Inpatient Admission How have you been since you were released from the hospital?: Better  Items Reviewed: Did you receive and understand the discharge instructions provided?: Yes Any new allergies since your discharge?: No Dietary orders reviewed?: NA Do you have support at home?: Yes  Medications Reviewed Today: Medications Reviewed Today   Medications were not reviewed in this encounter     Home Care and Equipment/Supplies: Were Home Health Services Ordered?: NA Any new equipment or medical supplies ordered?: NA  Functional Questionnaire: Do you need assistance with bathing/showering or dressing?: No Do you need assistance with meal preparation?: No Do you need assistance with eating?: No Do you have difficulty maintaining continence: No Do you need assistance with getting out of bed/getting out of a chair/moving?: No Do you have difficulty managing or taking your medications?: No  Follow up appointments reviewed: PCP Follow-up appointment confirmed?: No (Pt is admitted to an inpatient rehab facility) Specialist Hospital Follow-up appointment confirmed?: No Do you need transportation to your follow-up appointment?: No Do you understand care options if your condition(s) worsen?: Yes-patient verbalized understanding    SIGNATURE Arvil Persons, BSN, RN

## 2023-10-18 ENCOUNTER — Telehealth: Payer: Self-pay

## 2023-10-18 NOTE — Transitions of Care (Post Inpatient/ED Visit) (Signed)
   10/18/2023  Name: Tina Baker MRN: [id number] DOB: October 16, 1946  Today's TOC FU Call Status: Today's TOC FU Call Status:: Unsuccessful Call (1st Attempt) Unsuccessful Call (1st Attempt) Date: 10/18/23  Attempted to reach the patient regarding the most recent Inpatient/ED visit.  Follow Up Plan: Additional outreach attempts will be made to reach the patient to complete the Transitions of Care (Post Inpatient/ED visit) call.   Signature Arvil Persons, BSN, Charity fundraiser

## 2023-10-18 NOTE — Transitions of Care (Post Inpatient/ED Visit) (Signed)
   10/18/2023  Name: Tina Baker MRN: [id number] DOB: 27-Apr-1946  Today's TOC FU Call Status: Today's TOC FU Call Status:: Successful TOC FU Call Completed Unsuccessful Call (1st Attempt) Date: 10/18/23 [facility] FU Call Complete Date: 10/18/23 Patient's Name and Date of Birth confirmed.  Transition Care Management Follow-up Telephone Call Date of Discharge: 10/15/23 Discharge Facility: Other [facility]) Name of Other ([facility]) Discharge Facility: [facility] Type of Discharge: Inpatient Admission Primary Inpatient Discharge Diagnosis:: Partial intestinal obstruction, unspecified as to cause How have you been since you were released from the hospital?: Better Any questions or concerns?: No  Items Reviewed: Did you receive and understand the discharge instructions provided?: No Any new allergies since your discharge?: No Dietary orders reviewed?: No Do you have support at home?: No  Medications Reviewed Today: Medications Reviewed Today   Medications were not reviewed in this encounter     Home Care and Equipment/Supplies: Were Home Health Services Ordered?: NA Any new equipment or medical supplies ordered?: NA  Functional Questionnaire: Do you need assistance with bathing/showering or dressing?: Yes Do you need assistance with meal preparation?: Yes Do you need assistance with eating?: Yes Do you have difficulty maintaining continence: Yes Do you need assistance with getting out of bed/getting out of a chair/moving?: Yes Do you have difficulty managing or taking your medications?: Yes  Follow up appointments reviewed: PCP Follow-up appointment confirmed?: NA Specialist Hospital Follow-up appointment confirmed?: NA Do you need transportation to your follow-up appointment?: No Do you understand care options if your condition(s) worsen?: Yes-patient verbalized understanding    SIGNATURE Arvil Persons, BSN, RN

## 2023-10-28 ENCOUNTER — Telehealth: Payer: Self-pay | Admitting: Internal Medicine

## 2023-10-28 NOTE — Telephone Encounter (Signed)
Scheduled pt for video visit 12/30 at 2:40p (20 min slot). Pt believes she can complete via mychart on her computer. Pt wanting visit 12/27 if possible. Advised her that Morrie Sheldon did not have available slot 12/27 at this time and I will call her if we are able to work in/have a cancellation

## 2023-10-28 NOTE — Telephone Encounter (Signed)
Copied from CRM [id number]. Topic: Clinical - Medical Advice >> Oct 28, 2023  2:40 PM Chantha C wrote: Reason for CRM: Pt is having a lot of issues from the fall and rehab, pt really needs to speak with NP Salvatore Decent, an video visit if necessary pls c/b [phone]. Pt is very upset and crying, could not rely anymore details.

## 2023-10-28 NOTE — Telephone Encounter (Signed)
She will need a visit. If transportation is an issue, we can do video vist. It looks like she has been in the hospital twice since I last saw her.

## 2023-11-01 ENCOUNTER — Telehealth (INDEPENDENT_AMBULATORY_CARE_PROVIDER_SITE_OTHER): Payer: PPO | Admitting: Internal Medicine

## 2023-11-01 DIAGNOSIS — R350 Frequency of micturition: Secondary | ICD-10-CM | POA: Diagnosis not present

## 2023-11-01 DIAGNOSIS — I480 Paroxysmal atrial fibrillation: Secondary | ICD-10-CM | POA: Diagnosis not present

## 2023-11-01 DIAGNOSIS — Z9889 Other specified postprocedural states: Secondary | ICD-10-CM

## 2023-11-01 DIAGNOSIS — K5792 Diverticulitis of intestine, part unspecified, without perforation or abscess without bleeding: Secondary | ICD-10-CM

## 2023-11-01 DIAGNOSIS — K5669 Other partial intestinal obstruction: Secondary | ICD-10-CM

## 2023-11-01 DIAGNOSIS — Z8781 Personal history of (healed) traumatic fracture: Secondary | ICD-10-CM

## 2023-11-05 ENCOUNTER — Encounter: Payer: Self-pay | Admitting: Internal Medicine

## 2023-11-08 ENCOUNTER — Other Ambulatory Visit: Payer: Self-pay

## 2023-11-08 NOTE — Progress Notes (Signed)
   11/08/2023  Patient ID: [name], female   DOB: 07-Jul-1946, 78 y.o.   MRN: [id number]  S/O Telephone visit to follow-up on management of diabetes  Diabetes -Current medications:  glipizide  10mg  BID, Tresiba  18 units daily, Ozempic weekly -Patient is currently at rehabilitation facility after recent hip fracture, and she is not sure if Ozempic dose being given was increased from 0.25mg  to 0.5mg  weekly -States facility monitors BG and gives insulin  accordingly, so she is not able to provide recent BG readings -It is also not clear if she is currently receiving glipizide  or not  UTI -Patient recently diagnosed with UTI and began oral antibiotics Friday -Still experiencing hematuria but does state this has improved some since starting abx -She has not yet been able to get in with provider at rehabilitation facility  A/P  Diabetes -Continue current regimen while at rehabilitation facility; will likely be there a few more weeks  UTI -Recommended patient get in with facility provider if UTI symptoms (particularly hematuria) do not continue to improve  Follow-up:  1 month  [name], PharmD, DPLA

## 2023-11-15 ENCOUNTER — Encounter: Payer: Self-pay | Admitting: Internal Medicine

## 2023-11-20 ENCOUNTER — Other Ambulatory Visit: Payer: Self-pay | Admitting: Internal Medicine

## 2023-11-24 ENCOUNTER — Other Ambulatory Visit: Payer: Self-pay | Admitting: Internal Medicine

## 2023-11-24 ENCOUNTER — Telehealth: Payer: Self-pay

## 2023-11-24 DIAGNOSIS — B3731 Acute candidiasis of vulva and vagina: Secondary | ICD-10-CM

## 2023-11-24 MED ORDER — FLUCONAZOLE 150 MG PO TABS: ORAL_TABLET | ORAL | 0 refills | Status: DC

## 2023-11-24 NOTE — Telephone Encounter (Signed)
I have sent in Diflucan for her yeast infection Yes it is okay for patient to take her Ozempic Her Lantus dose may have been adjusted from her hospital admission and at the nursing home.  She needs to continue with Lantus 10 units at this time. No she does not use Humalog. Yes it is okay to give her the alprazolam instead of the Ativan The patient will need her CBC checked to see if she still needs the iron tablets.  This may be on her discharge paperwork from the nursing facility.  She does need to schedule a hospital follow-up visit with me so we can update and review her medicines and check any labs necessary.  She needs to bring her nursing home paperwork with her to that appointment

## 2023-11-24 NOTE — Telephone Encounter (Signed)
Please advise    Copied from CRM [id number]. Topic: General - Other >> Nov 24, 2023 10:15 AM Dimitri Ped wrote: Reason for CRM: Ms Suzette Battiest with [facility] is calling to give a report to provider . We did get her to skilled nursing and primary diagnosis of heart failure . She is using 3 liters of oxygen at home she has shortness of breath with minimum insertion . She reported a yest infection started at [facility] they did not have medication for her . So she needs something for that yeast infection. And there was some discrepancy in her medication. Humalog she does not have this in her home and she does not use it and want to confirm this with the doctor . Ozempic has it in her home but not in her records and want to make sure its ok for patient to take . Lantus on her discharge paper it was ordered for 10 units . She said that you guys told her to take 13 units and on the prescribed box it says 18 units just wanted to clarify on what dose should she take . Alprazolam 0.5 mg I was in her home and she say she take this at night as needed and on her med list it was a different medication ativan just wanted to make sure the ALPRAZolam (XANAX) 0.5 MG tablet is ok instead of the ativan. She is out of iron tablets and I know she was on that for blood lost do the provider want her to maintain and if so can he prescribe a refill  [phone] Ms. Veronica with [facility]

## 2023-11-24 NOTE — Telephone Encounter (Signed)
 Left voicemail asking patient to return call at [phone].

## 2023-11-25 ENCOUNTER — Telehealth: Payer: Self-pay

## 2023-11-25 ENCOUNTER — Telehealth: Payer: Self-pay | Admitting: Internal Medicine

## 2023-11-25 NOTE — Telephone Encounter (Signed)
PAP: Patient assistance application for Ozempic has been approved by PAP Companies: [employer] from 08/06/2023 to 08/05/2024. Medication should be delivered to PAP Delivery: Provider's office. For further shipping updates, please [employer] at [phone]. Patient ID is: [id number]   PLEASE BE ADVISED PT IS ELIGIBLE TO RECEIVER A REORDER FOR MEDICATION PLEASE SEE MEDIA IN CHART TO SEND TO COMPANY FOR REORDER OR CHANGE IN DOSE

## 2023-11-25 NOTE — Telephone Encounter (Signed)
Copied from CRM [id number]. Topic: Clinical - Home Health Verbal Orders >> Nov 25, 2023  3:55 PM Leavy Cella D wrote: Caller/Agency: [facility]ery[facility] Ready Number: [phone] Service Requested: Occupational Therapy Frequency: 1 a week for 8 weeks  Any new concerns about the patient? No

## 2023-11-25 NOTE — Telephone Encounter (Signed)
Tina Baker returned call and was informed of message,  and will pass along to patient

## 2023-11-25 NOTE — Telephone Encounter (Signed)
Yes to Verbal order. Occupational therapy 1 time per week for 8 weeks.

## 2023-11-26 NOTE — Telephone Encounter (Signed)
I called and spoke with Elby Showers at [facility] and gave him verbal of for occupational therapy

## 2023-11-29 ENCOUNTER — Telehealth: Payer: Self-pay

## 2023-11-29 ENCOUNTER — Telehealth: Payer: Self-pay | Admitting: Internal Medicine

## 2023-11-29 NOTE — Telephone Encounter (Signed)
Type of forms received:  [facility]   Routed ZO:[id number] Izola Price, FNP  Paperwork received by : Zahra Peffley   Individual made aware of 3-5 business day turn around (Y/N):   Form completed and patient made aware of charges(Y/N):   Faxed to : [phone] on 11/30/23 Form location:

## 2023-11-29 NOTE — Telephone Encounter (Signed)
Type of forms received: Home Health Certificate  Routed to: Salvatore Decent, FNP   Paperwork received by :    Individual made aware of 3-5 business day turn around (Y/N):  Form completed and patient made aware of charges(Y/N):   Faxed to : [phone] on 11/30/23   Form location:

## 2023-11-29 NOTE — Telephone Encounter (Signed)
Patient dropped off document Home Health Certificate (Order ID  ), to be filled out by provider. Patient requested to send it back via Fax within 7-days. Document is located in providers tray at front office.Please advise at Mobile [phone] (mobile)   Home health order by fax.. I put in the dr box

## 2023-12-02 ENCOUNTER — Telehealth: Payer: Self-pay

## 2023-12-02 ENCOUNTER — Encounter: Payer: Self-pay | Admitting: Internal Medicine

## 2023-12-02 ENCOUNTER — Ambulatory Visit: Payer: PPO | Admitting: Internal Medicine

## 2023-12-02 VITALS — BP 130/68 | HR 90

## 2023-12-02 DIAGNOSIS — K5792 Diverticulitis of intestine, part unspecified, without perforation or abscess without bleeding: Secondary | ICD-10-CM

## 2023-12-02 DIAGNOSIS — I480 Paroxysmal atrial fibrillation: Secondary | ICD-10-CM

## 2023-12-02 DIAGNOSIS — Z9889 Other specified postprocedural states: Secondary | ICD-10-CM

## 2023-12-02 DIAGNOSIS — I509 Heart failure, unspecified: Secondary | ICD-10-CM

## 2023-12-02 DIAGNOSIS — Z794 Long term (current) use of insulin: Secondary | ICD-10-CM

## 2023-12-02 DIAGNOSIS — R319 Hematuria, unspecified: Secondary | ICD-10-CM | POA: Diagnosis not present

## 2023-12-02 DIAGNOSIS — Z7901 Long term (current) use of anticoagulants: Secondary | ICD-10-CM

## 2023-12-02 DIAGNOSIS — L89322 Pressure ulcer of left buttock, stage 2: Secondary | ICD-10-CM

## 2023-12-02 DIAGNOSIS — D500 Iron deficiency anemia secondary to blood loss (chronic): Secondary | ICD-10-CM

## 2023-12-02 DIAGNOSIS — E119 Type 2 diabetes mellitus without complications: Secondary | ICD-10-CM

## 2023-12-02 DIAGNOSIS — E1165 Type 2 diabetes mellitus with hyperglycemia: Secondary | ICD-10-CM

## 2023-12-02 LAB — POC URINALSYSI DIPSTICK (AUTOMATED)
Bilirubin, UA: NEGATIVE
Glucose, UA: NEGATIVE
Ketones, UA: NEGATIVE
Nitrite, UA: NEGATIVE
Protein, UA: POSITIVE — AB
Spec Grav, UA: 1.025 (ref 1.010–1.025)
Urobilinogen, UA: NEGATIVE U/dL — AB
pH, UA: 6 (ref 5.0–8.0)

## 2023-12-02 MED ORDER — FUROSEMIDE 40 MG PO TABS: 40.0000 mg | ORAL_TABLET | Freq: Every day | ORAL | Status: DC

## 2023-12-02 NOTE — Telephone Encounter (Signed)
Called [facility] and left a voice message for medical records asking to give our office a call back. Called back to the facility and asked for a good fax number to medical records. Sent a Building services engineer to [employer] requested discharge summary and medication list

## 2023-12-02 NOTE — Progress Notes (Signed)
[facility] [address] [place] [address] Dept: [phone] Dept Fax: [phone]    Subjective:   Tina Baker 12/21/1945 12/02/2023  Chief Complaint  Patient presents with   Follow-up    Recent hospital visit     HPI: Tina Baker presents today for re-assessment and management of chronic medical conditions.  Discussed the use of AI scribe software for clinical note transcription with the patient, who gave verbal consent to proceed.  History of Present Illness       Tina Baker is a 78 year old female who presents for follow up of recent hospitalizations. Patient was admitted to [facility] on 09/23/2023 after she fell from tripping on the last step of her stepladder, causing a right femur fracture with displacement.  Patient went to the OR for ORIF on 09/24/2023.  During hospitalization patient had further complications such as going back into A-fib with RVR, was given metoprolol and Cardizem.  Then had a episode of unresponsiveness, found to be in a flutter with heart rate in the 50s and SBP in 70s to 80s.  Patient was cardioverted.  Patient was also found to have pinpoint pupils, was on Norco 5-325 mg for pain control after surgery.  She was given Narcan 0.4 mg IV with slight improvement in her mental status.  She was then given an additional Narcan 1 mg IV with further improvement in mental status.  Patient was transferred to ICU at that time.  Patient did have an echocardiogram done on 09/28/2023 which did show evidence of volume overload, was diuresed with IV Lasix.  She was also given a transfusion of 1 unit packed red blood cells, and was weaned off vasopressor support in the ICU.  Patient was transferred to [facility] for rehab.   Further, patient was readmitted to the hospital on 10/10/2023 for significant abdominal pain.  She was found to have diverticulitis with partial  intestinal obstruction, and placed on Augmentin for total of 7 to 10-day course.  CT scan revealed possible bladder abnormalities, CT cystogram was performed that did not show any evidence to suggest bladder leak or rupture.  Urology was consulted.  Patient required intermittent catheterization and was started on Flomax.  However, her urinary retention persisted, which warranted a Foley catheter placement that was present on hospital discharge. Patient was also consulted by orthopedic surgery due to drainage from ORIF site.  A Prevena dressing was placed.  Patient was then discharged to [facility] rehab.   She has since been discharged from [facility] rehab on 11/21/23, and is currently receiving home health care. PT 2 times a week, nurse visits every other week.   Patient's concerns today:   The patient presents with urinary symptoms that have persisted since being at [facility] rehab. She reports blood in the urine, pain and pressure around the bladder, and frequent urination. The patient states that the symptoms are severe enough to cause discomfort and disrupt daily activities.   In addition to the urinary symptoms, the patient also reports issues with blood sugar control. She states that her blood sugar levels have been high, with a recent reading of 222. The patient is currently on Lantus 10 units and Ozempic 0.25mg  once weekly for diabetes management but expresses concern about the frequency of insulin administration during her recent hospital stay.  The patient also expresses concern about potential bed sores. She reports having noticed the sores while in the rehabilitation facility and has been  applying Neosporin for treatment.   The patient also mentions discomfort due to edema in lower extremities and difficulty with mobility due to recent surgery.  The patient's medication regimen appears to have been altered during her recent hospital stay, with changes to her Lasix and spironolactone dosages.  The patient reports being taken off all diuretics, not currently taking any Lasix, which she believes is contributing to her edema. She does have hx of CHF.   She is also wondering if she needs to continue to take iron supplement that she was given due to blood loss post ORIF.  She has not taken it for the past 2 weeks   The following portions of the patient's history were reviewed and updated as appropriate: past medical history, past surgical history, family history, social history, allergies, medications, and problem list.   Patient Active Problem List   Diagnosis Date Noted   Impaired functional mobility, balance, gait, and endurance 03/31/2023   Irritant contact dermatitis 03/31/2023   Arthralgia of right wrist 03/31/2023   Lumbar radiculopathy 02/22/2023   Acute idiopathic gout of right foot 02/02/2023   Chronic bilateral low back pain without sciatica 01/05/2023   Bilateral hip pain 01/05/2023   Impaired ambulation 01/05/2023   Weakness of both lower extremities 01/05/2023   Tinea unguium 12/01/2022   Heart failure (HCC) 04/09/2022   Acute on chronic heart failure with preserved ejection fraction (HFpEF) (HCC) 04/01/2022   Cryptogenic cirrhosis (HCC) 02/19/2022   Hypoglycemia 11/13/2021   Chronic respiratory failure with hypoxia (HCC) 11/12/2021   CAP (community acquired pneumonia) 11/12/2021   Subtherapeutic anticoagulation 11/12/2021   TIA (transient ischemic attack) 11/07/2021   Aspiration pneumonia (HCC) 09/17/2021   RSV (respiratory syncytial virus infection) 09/17/2021   Senile nuclear sclerosis 03/18/2021   Hypertrophy of bone, right ankle and foot 01/24/2021   Restrictive lung disease 05/17/2020   History of colon polyps 12/11/2019   Hyperbilirubinemia 12/11/2019   Thrombocytopenia (HCC) 12/11/2019   Pulmonary hypertension (HCC) 11/24/2019   Bilateral pleural effusion 10/25/2019   Primary osteoarthritis of left hip 06/14/2019   Trochanteric bursitis of left hip  06/14/2019   Intolerance of continuous positive airway pressure (CPAP) ventilation 05/03/2019   Primary osteoarthritis of left knee 05/01/2019   Primary osteoarthritis of right knee 05/01/2019   Bilateral carpal tunnel syndrome 05/01/2019   Trigger thumb of left hand 05/01/2019   ANA positive 02/23/2019   Rheumatoid factor positive 02/23/2019   GI bleed 02/17/2019   Generalized anxiety disorder 11/27/2018   DNR (do not resuscitate) 11/27/2018   Elevated d-dimer 11/27/2018   Hyponatremia 11/27/2018   Major depressive disorder with current active episode 11/27/2018   Obesity (BMI 30-39.9) 11/27/2018   Essential hypertension 11/27/2018   Acute hypoxemic respiratory failure (HCC) 11/27/2018   Obstructive sleep apnea (adult) (pediatric) 08/12/2018   Acute pulmonary edema (HCC) 07/08/2018   Recurrent major depressive disorder, in partial remission (HCC) 04/13/2018   Varicose veins of left lower extremity with pain 04/13/2018   Constipation 02/19/2018   Long term (current) use of anticoagulants 12/31/2015   Acromioclavicular joint arthritis 12/31/2015   Subacromial bursitis 12/31/2015   Allergic rhinitis 11/13/2015   Benign essential hypertension 11/13/2015   Chronic tension-type headache, not intractable 11/13/2015   DDD (degenerative disc disease), lumbosacral 11/13/2015   Gastroesophageal reflux disease without esophagitis 11/13/2015   Paroxysmal atrial fibrillation (HCC) 11/13/2015   RLS (restless legs syndrome) 11/13/2015   Vitamin D deficiency 11/13/2015   Insomnia 11/13/2015   Class 1 obesity due to excess  calories with serious comorbidity and body mass index (BMI) of 33.0 to 33.9 in adult 11/13/2015   Contracture of tendon sheath 11/13/2015   Spinal stenosis of lumbar region without neurogenic claudication 11/13/2015   Gout, arthritis 11/13/2015   Iron deficiency anemia due to chronic blood loss 11/13/2015   Lumbar herniated disc 11/13/2015   Rotator cuff tendinitis  11/13/2015   Swelling 09/30/2015   Chest discomfort 07/18/2015   Hospital discharge follow-up 07/04/2015   Leukocytosis 06/27/2015   Anemia, unspecified 06/26/2015   Type 2 diabetes mellitus, without long-term current use of insulin (HCC) 06/25/2015   Venous insufficiency, peripheral 01/09/2015   Spondylolisthesis of lumbar region 11/03/2012   Past Medical History:  Diagnosis Date   Arthritis    Atrial fibrillation (HCC)    Diabetes mellitus    Hyperlipidemia    Hypertension    Past Surgical History:  Procedure Laterality Date   ABDOMINAL HYSTERECTOMY  2001   BUNIONECTOMY Bilateral 1979   GALLBLADDER SURGERY     SPINE SURGERY  2010   protruding disc    Family History  Problem Relation Age of Onset   Cancer Mother 16       leukemia    Heart disease Father 3       heart attack    Heart disease Sister     Current Outpatient Medications:    ELIQUIS 5 MG TABS tablet, Take 5 mg by mouth 2 (two) times daily., Disp: , Rfl:    nitrofurantoin, macrocrystal-monohydrate, (MACROBID) 100 MG capsule, Take 1 capsule (100 mg total) by mouth 2 (two) times daily for 7 days., Disp: 14 capsule, Rfl: 0   acetaminophen (TYLENOL) 325 MG tablet, Take by mouth., Disp: , Rfl:    ALPRAZolam (XANAX) 0.5 MG tablet, Take 1 tablet (0.5 mg total) by mouth at bedtime as needed., Disp: 90 tablet, Rfl: 0   aspirin 81 MG tablet, Take 81 mg by mouth daily.   (Patient not taking: Reported on 11/01/2023), Disp: , Rfl:    cholecalciferol (VITAMIN D3) 25 MCG (1000 UT) tablet, Take 2,000 Units by mouth daily. (Patient not taking: Reported on 11/01/2023), Disp: , Rfl:    diltiazem (CARDIZEM CD) 360 MG 24 hr capsule, JF (Patient not taking: Reported on 11/01/2023), Disp: , Rfl:    dofetilide (TIKOSYN) 250 MCG capsule, Take 1 capsule by mouth 2 (two) times daily. (Patient not taking: Reported on 11/01/2023), Disp: , Rfl:    fluticasone (FLONASE) 50 MCG/ACT nasal spray, Place 2 sprays into both nostrils daily.  (Patient not taking: Reported on 11/01/2023), Disp: 16 g, Rfl: 2   furosemide (LASIX) 40 MG tablet, Take 1 tablet (40 mg total) by mouth daily., Disp: , Rfl:    glipiZIDE (GLUCOTROL) 10 MG tablet, TAKE 1 TABLET BY MOUTH TWICE DAILY BEFORE A MEAL, Disp: 180 tablet, Rfl: 0   glucose blood (RELION PRIME TEST) test strip, 1 each by Other route See admin instructions., Disp: , Rfl:    insulin degludec (TRESIBA) 100 UNIT/ML FlexTouch Pen, Inject 18 Units into the skin at bedtime., Disp: 15 mL, Rfl: 1   Insulin Pen Needle (GLOBAL EASY GLIDE PEN NEEDLES) 32G X 4 MM MISC, Use as directed to inject insulin once daily., Disp: 200 each, Rfl: 3   lansoprazole (PREVACID) 30 MG capsule, Take 1 capsule (30 mg total) by mouth daily., Disp: 90 capsule, Rfl: 1   metoprolol succinate (TOPROL-XL) 50 MG 24 hr tablet, Take 1 tablet by mouth daily., Disp: , Rfl:    mupirocin  nasal ointment (BACTROBAN) 2 %, Place 1 application  into the nose as needed. Use one-half of tube in each nostril twice daily for five (5) days. After application, press sides of nose together and gently massage. (Patient not taking: Reported on 11/01/2023), Disp: , Rfl:    ondansetron (ZOFRAN-ODT) 4 MG disintegrating tablet, Place 1 tablet under your tongue every 8 hours as needed, Disp: , Rfl: 0   Pramipexole Dihydrochloride 1.5 MG TB24, Take 1.5 mg by mouth., Disp: , Rfl:    sacubitril-valsartan (ENTRESTO) 24-26 MG, Take 1 tablet by mouth 2 (two) times daily., Disp: , Rfl:    Semaglutide,0.25 or 0.5MG /DOS, 2 MG/3ML SOPN, Inject 0.5 mg into the skin once a week. 0.25mg  weekly x4 weeks, then increase to 0.5mg  weekly, Disp: , Rfl:    spironolactone (ALDACTONE) 25 MG tablet, Take 0.5 tablets by mouth daily. (Patient not taking: Reported on 11/01/2023), Disp: , Rfl:  Allergies  Allergen Reactions   Statins Other (See Comments)    myalgia   Valdecoxib Rash          ROS: A complete ROS was performed with pertinent positives/negatives noted in the  HPI. The remainder of the ROS are negative.    Objective:   Today's Vitals   12/02/23 1428  BP: 130/68  Pulse: 90  SpO2: 91%    GENERAL: Well-appearing, in NAD. Well nourished.  SKIN: Pink, warm and dry. <0.5cm stage 2 pressure ulcer to left buttock  NECK: Trachea midline. Full ROM w/o pain or tenderness. No lymphadenopathy.  RESPIRATORY: Chest wall symmetrical. Respirations even and non-labored. Breath sounds clear to auscultation bilaterally.  CARDIAC: S1, S2 present, regular rate and irregular rhythm. Peripheral pulses 2+ bilaterally.  EXTREMITIES: Without clubbing, cyanosis. 3+ pitting edema BLE PSYCH/MENTAL STATUS: Alert, oriented x 3. Cooperative, appropriate mood and affect.   Health Maintenance Due  Topic Date Due   FOOT EXAM  Never done   Hepatitis C Screening  Never done   Zoster Vaccines- Shingrix (1 of 2) 11/05/1965   COVID-19 Vaccine (3 - Pfizer risk series) 02/14/2020    Results for orders placed or performed in visit on 12/02/23  CBC w/Diff  Result Value Ref Range   WBC 13.5 (H) 4.0 - 10.5 K/uL   RBC 4.74 3.87 - 5.11 Mil/uL   Hemoglobin 12.6 12.0 - 15.0 g/dL   HCT 34.7 42.5 - 95.6 %   MCV 84.2 78.0 - 100.0 fl   MCHC 31.6 30.0 - 36.0 g/dL   RDW 38.7 (H) 56.4 - 33.2 %   Platelets 223.0 150.0 - 400.0 K/uL   Neutrophils Relative % 70.7 43.0 - 77.0 %   Lymphocytes Relative 19.4 12.0 - 46.0 %   Monocytes Relative 7.7 3.0 - 12.0 %   Eosinophils Relative 1.0 0.0 - 5.0 %   Basophils Relative 1.2 0.0 - 3.0 %   Neutro Abs 9.6 (H) 1.4 - 7.7 K/uL   Lymphs Abs 2.6 0.7 - 4.0 K/uL   Monocytes Absolute 1.0 0.1 - 1.0 K/uL   Eosinophils Absolute 0.1 0.0 - 0.7 K/uL   Basophils Absolute 0.2 (H) 0.0 - 0.1 K/uL  Comp Met (CMET)  Result Value Ref Range   Sodium 133 (L) 135 - 145 mEq/L   Potassium 4.7 3.5 - 5.1 mEq/L   Chloride 95 (L) 96 - 112 mEq/L   CO2 26 19 - 32 mEq/L   Glucose, Bld 242 (H) 70 - 99 mg/dL   BUN 16 6 - 23 mg/dL   Creatinine, Ser 9.51  0.40 - 1.20 mg/dL    Total Bilirubin 0.9 0.2 - 1.2 mg/dL   Alkaline Phosphatase 152 (H) 39 - 117 U/L   AST 23 0 - 37 U/L   ALT 19 0 - 35 U/L   Total Protein 6.8 6.0 - 8.3 g/dL   Albumin 3.6 3.5 - 5.2 g/dL   GFR 47.82 (L) >95.62 mL/min   Calcium 8.8 8.4 - 10.5 mg/dL  Protime-INR  Result Value Ref Range   INR 1.7 (H) 0.8 - 1.0 ratio   Prothrombin Time 17.9 (H) 9.6 - 13.1 sec  POCT Urinalysis Dipstick (Automated)  Result Value Ref Range   Color, UA Light Red    Clarity, UA Cloudy    Glucose, UA Negative Negative   Bilirubin, UA Negative    Ketones, UA Negative    Spec Grav, UA 1.025 1.010 - 1.025   Blood, UA 3+    pH, UA 6.0 5.0 - 8.0   Protein, UA Positive (A) Negative   Urobilinogen, UA negative (A) 0.2 or 1.0 E.U./dL   Nitrite, UA Negative    Leukocytes, UA Large (3+) (A) Negative    The 10-year ASCVD risk score (Arnett DK, et al., 2019) is: 43.8%     Assessment & Plan:  1. Status post open reduction and internal fixation (ORIF) of fracture (Primary) Continue in-home physical therapy and routine follow-ups with orthopedics  2. Diverticulitis Resolved  3. Type 2 diabetes mellitus with hyperglycemia, with long-term current use of insulin (HCC) - Comp Met (CMET) - insulin degludec (TRESIBA) 100 UNIT/ML FlexTouch Pen; Inject 10 Units into the skin at bedtime. -Instructed patient to increase Ozempic to 0.5 mg once weekly, if tolerated will increase to 1 mg in 1 month.  4. Chronic heart failure, unspecified heart failure type (HCC) -Continue Entresto twice daily - Re-start furosemide 40 mg p.o. daily -Advised daily weights, low-sodium diet  5. Paroxysmal atrial fibrillation (HCC) -Continue Tikosyn, diltiazem, and metoprolol as directed by cardiology -Continue routine follow-ups with cardiology  6. Long term (current) use of anticoagulants -Continue Eliquis  7. Iron deficiency anemia due to chronic blood loss -Anemia has resolved, patient can discontinue iron supplements  8.  Hematuria, unspecified type - Protime-INR; Future - CBC w/Diff - POCT Urinalysis Dipstick (Automated) - Urine Culture - Protime-INR - nitrofurantoin, macrocrystal-monohydrate, (MACROBID) 100 MG capsule; Take 1 capsule (100 mg total) by mouth 2 (two) times daily for 7 days.  Dispense: 14 capsule; Refill: 0 -If hematuria does not resolve with antibiotic for UTI, will defer to urology for further evaluation  9. Pressure ulcer of left buttock, stage 2 (HCC) -Continue Neosporin application. Use a barrier cream provided by home health -Use Mepilex foam dressings for cushioning and pressure relief. -Change position every 1-2 hours to redistribute weight. - home health nurse for further wound care management.  Orders Placed This Encounter  Procedures   Urine Culture   Protime-INR    Standing Status:   Future    Number of Occurrences:   1    Expiration Date:   12/01/2024   CBC w/Diff   Comp Met (CMET)   POCT Urinalysis Dipstick (Automated)   No images are attached to the encounter or orders placed in the encounter. Meds ordered this encounter  Medications   furosemide (LASIX) 40 MG tablet    Sig: Take 1 tablet (40 mg total) by mouth daily.    Supervising Provider:   Garnette Gunner [[id number]]   nitrofurantoin, macrocrystal-monohydrate, (MACROBID) 100 MG capsule  Sig: Take 1 capsule (100 mg total) by mouth 2 (two) times daily for 7 days.    Dispense:  14 capsule    Refill:  0    Supervising Provider:   Garnette Gunner [[id number]]    Return in about 1 week (around 12/09/2023) for Chronic Condition follow up - virtual visit .   Salvatore Decent, FNP

## 2023-12-02 NOTE — Patient Instructions (Addendum)
Increase Ozempic to 0.5mg  once weekly   Start Lasix 40mg  once a daily    Mepilex foam dressing to butt  Change position ever 1-2 hours to re-distribute weight   We are getting information and medication list from [facility]    I will call you with labs and urine results

## 2023-12-03 ENCOUNTER — Telehealth: Payer: Self-pay

## 2023-12-03 LAB — CBC WITH DIFFERENTIAL/PLATELET
Basophils Absolute: 0.2 10*3/uL — ABNORMAL HIGH (ref 0.0–0.1)
Basophils Relative: 1.2 % (ref 0.0–3.0)
Eosinophils Absolute: 0.1 10*3/uL (ref 0.0–0.7)
Eosinophils Relative: 1 % (ref 0.0–5.0)
HCT: 39.9 % (ref 36.0–46.0)
Hemoglobin: 12.6 g/dL (ref 12.0–15.0)
Lymphocytes Relative: 19.4 % (ref 12.0–46.0)
Lymphs Abs: 2.6 10*3/uL (ref 0.7–4.0)
MCHC: 31.6 g/dL (ref 30.0–36.0)
MCV: 84.2 fL (ref 78.0–100.0)
Monocytes Absolute: 1 10*3/uL (ref 0.1–1.0)
Monocytes Relative: 7.7 % (ref 3.0–12.0)
Neutro Abs: 9.6 10*3/uL — ABNORMAL HIGH (ref 1.4–7.7)
Neutrophils Relative %: 70.7 % (ref 43.0–77.0)
Platelets: 223 10*3/uL (ref 150.0–400.0)
RBC: 4.74 Mil/uL (ref 3.87–5.11)
RDW: 20.9 % — ABNORMAL HIGH (ref 11.5–15.5)
WBC: 13.5 10*3/uL — ABNORMAL HIGH (ref 4.0–10.5)

## 2023-12-03 LAB — COMPREHENSIVE METABOLIC PANEL
ALT: 19 U/L (ref 0–35)
AST: 23 U/L (ref 0–37)
Albumin: 3.6 g/dL (ref 3.5–5.2)
Alkaline Phosphatase: 152 U/L — ABNORMAL HIGH (ref 39–117)
BUN: 16 mg/dL (ref 6–23)
CO2: 26 meq/L (ref 19–32)
Calcium: 8.8 mg/dL (ref 8.4–10.5)
Chloride: 95 meq/L — ABNORMAL LOW (ref 96–112)
Creatinine, Ser: 0.94 mg/dL (ref 0.40–1.20)
GFR: 58.71 mL/min — ABNORMAL LOW (ref >60.00)
Glucose, Bld: 242 mg/dL — ABNORMAL HIGH (ref 70–99)
Potassium: 4.7 meq/L (ref 3.5–5.1)
Sodium: 133 meq/L — ABNORMAL LOW (ref 135–145)
Total Bilirubin: 0.9 mg/dL (ref 0.2–1.2)
Total Protein: 6.8 g/dL (ref 6.0–8.3)

## 2023-12-03 LAB — PROTIME-INR
INR: 1.7 {ratio} — ABNORMAL HIGH (ref 0.8–1.0)
Prothrombin Time: 17.9 s — ABNORMAL HIGH (ref 9.6–13.1)

## 2023-12-03 MED ORDER — INSULIN DEGLUDEC 100 UNIT/ML ~~LOC~~ SOPN: 10.0000 [IU] | PEN_INJECTOR | Freq: Every day | SUBCUTANEOUS | Status: DC

## 2023-12-03 MED ORDER — NITROFURANTOIN MONOHYD MACRO 100 MG PO CAPS: 100.0000 mg | ORAL_CAPSULE | Freq: Two times a day (BID) | ORAL | 0 refills | Status: AC

## 2023-12-03 NOTE — Telephone Encounter (Signed)
Copied from CRM [id number]. Topic: General - Other >> Dec 03, 2023  2:15 PM Almira Coaster wrote: Reason for CRM:  Larita Fife is calling to follow up on a voicemail they received. Informed that we were looking for the discharge summary and med list and provided our fax number. They will be faxing over all documentation.

## 2023-12-03 NOTE — Telephone Encounter (Signed)
Please advise   Copied from CRM [id number]. Topic: Clinical - Home Health Verbal Orders >> Dec 03, 2023  2:48 PM Marica Otter wrote: Caller/Agency: Denese Killings Number: [phone] Okay to leave message Service Requested: Skilled Nursing Frequency: 1 week 5 Any new concerns about the patient? Yes, provider is aware has stage 1 to left buttock.

## 2023-12-03 NOTE — Telephone Encounter (Signed)
 Noted

## 2023-12-04 LAB — URINE CULTURE
MICRO NUMBER:: 16020064
SPECIMEN QUALITY:: ADEQUATE

## 2023-12-06 ENCOUNTER — Encounter: Payer: Self-pay | Admitting: Internal Medicine

## 2023-12-06 ENCOUNTER — Other Ambulatory Visit: Payer: Self-pay

## 2023-12-06 NOTE — Progress Notes (Signed)
   12/06/2023  Patient ID: Tina Baker, female   DOB: 1946-02-16, 78 y.o.   MRN: [id number]  Subjective/Objective Patient out reach to follow-up on management of diabetes  Diabetes management -Current medications: Ozempic 0.5 mg weekly, Tresiba 10 units at bedtime, glipizide 10 mg twice daily before meals -Glipizide appears to have been removed from patient's medication list during recent hospitalization, but she does continue to take this at home -Patient endorses elevated FBGs ranging 152 to >250 -Last A1c 5 months ago was 9.2% -Patient is still experiencing signs and symptoms of a UTI, and she is being treated with a 1 week regimen of Macrobid.  UTI has been present for over a month now. -Jardiance listed on her medication list, but she is not taking this medication due to cost.  Does not qualify for patient assistance program. -Patient will take her first dose of recently increased Ozempic 0.5 mg today  Assessment/Plan  Diabetes management -Currently uncontrolled -Continue Ozempic 0.5 mg weekly for 4 weeks, with goal to increase to 1 mg weekly thereafter -Continue glipizide 10 mg twice daily with meals and Tresiba 10 units at bedtime -Goal to decrease/stop glipizide and/or Tresiba as we increase Ozempic to max dose -Continue to monitor and record fasting blood glucose  Follow-up: 4 weeks  Lenna Gilford, PharmD, DPLA

## 2023-12-06 NOTE — Telephone Encounter (Signed)
Verbal order okay for skilled nursing once a week for 5 weeks. Yes, I am aware of the pressure ulcer. Please have them apply barrier cream and use mepilex dressing. Alternate pressure every 2 hours.

## 2023-12-06 NOTE — Telephone Encounter (Signed)
Veronica informed verbal orders approved

## 2023-12-08 ENCOUNTER — Other Ambulatory Visit: Payer: Self-pay | Admitting: Internal Medicine

## 2023-12-08 DIAGNOSIS — F411 Generalized anxiety disorder: Secondary | ICD-10-CM

## 2023-12-09 ENCOUNTER — Telehealth (INDEPENDENT_AMBULATORY_CARE_PROVIDER_SITE_OTHER): Payer: PPO | Admitting: Internal Medicine

## 2023-12-09 ENCOUNTER — Telehealth: Payer: Self-pay | Admitting: Internal Medicine

## 2023-12-09 DIAGNOSIS — Z794 Long term (current) use of insulin: Secondary | ICD-10-CM

## 2023-12-09 DIAGNOSIS — M10079 Idiopathic gout, unspecified ankle and foot: Secondary | ICD-10-CM | POA: Diagnosis not present

## 2023-12-09 DIAGNOSIS — I509 Heart failure, unspecified: Secondary | ICD-10-CM

## 2023-12-09 DIAGNOSIS — L89322 Pressure ulcer of left buttock, stage 2: Secondary | ICD-10-CM

## 2023-12-09 DIAGNOSIS — E1165 Type 2 diabetes mellitus with hyperglycemia: Secondary | ICD-10-CM | POA: Diagnosis not present

## 2023-12-09 DIAGNOSIS — R319 Hematuria, unspecified: Secondary | ICD-10-CM

## 2023-12-09 MED ORDER — INDOMETHACIN 50 MG PO CAPS: 50.0000 mg | ORAL_CAPSULE | Freq: Three times a day (TID) | ORAL | 0 refills | Status: AC

## 2023-12-09 NOTE — Telephone Encounter (Addendum)
 Paperwork was collected and placed in provider sign/review folder.

## 2023-12-09 NOTE — Telephone Encounter (Signed)
 Patient dropped off document Home Health Certificate (Order ID [id number]), to be filled out by provider. Patient requested to send it back via Fax within 7-days. Document is located in providers tray at front office.Please advise at  [phone]

## 2023-12-10 ENCOUNTER — Telehealth: Payer: Self-pay | Admitting: Internal Medicine

## 2023-12-10 NOTE — Telephone Encounter (Signed)
 Patient dropped off document Home Health Certificate (Order ID [id number]), to be filled out by provider. Patient requested to send it back via Fax within 7-days. Document is located in providers tray at front office.Please advise at  [phone]

## 2023-12-10 NOTE — Progress Notes (Signed)
 [facility] [address] [place] [address] Dept: [phone] Dept Fax: [phone]  Virtual Video Visit  I connected with Tina Baker on 12/10/23 at  2:40 PM EST by a video enabled telemedicine application and verified that I am speaking with the correct person using two identifiers.   Location patient: Home Location provider: Clinic Total time: 25 minutes Persons participating in the virtual visit: Patient; Angelyn Hollingsworth CMA; Rosina Senters, FNP-C  I discussed the limitations of evaluation and management by telemedicine and the availability of in-person appointments. The patient expressed understanding and agreed to proceed.  Chief Complaint  Patient presents with   Medical Managment of Chronic Issues     PT would like to discuss medication management and UTI symptoms  Urology referral for further evaluation; shingles vaccine is due.     SUBJECTIVE:  HPI:  Patient presents for follow-up of chronic conditions.  Patient was seen in clinic last week, found to have a UTI and was placed on Macrobid .  Patient has been compliant with Macrobid  therapy, but continues to report of hematuria.  This was noted in her most recent hospitalization, she has not heard anything from the urology referral for outpatient evaluation.  She was also started back on her Lasix  for her lower extremity edema, which is improving after reinitiation.  She did meet with the pharmacist to discuss her diabetes medications, she has increased her Ozempic to 0.5 mg once weekly (first dose of 0.5 mg was this week), continues to take her Lantus 10 units at bedtime, and glipizide  10 mg twice daily  She does have her home health nurse coming out once a week.  They have continued to evaluate and treat the stage II pressure ulcer to patient's left buttock.  She is using Mepilex dressings and trying to alternate positions to alleviate pressure.  She reports the  area is improving.  She is concerned that she is having an acute gout attack.  She has had gout in the past.  She reports pain to the right foot, localized, ongoing 1 to 2 days.  She reports it is warm to touch and painful.  The following portions of the patient's history were reviewed and updated as appropriate: medical history, surgical history, medications, allergies, social history, and family history.    Past Medical History:  Diagnosis Date   Arthritis    Atrial fibrillation (HCC)    Diabetes mellitus    Hyperlipidemia    Hypertension    Past Surgical History:  Procedure Laterality Date   ABDOMINAL HYSTERECTOMY  2001   BUNIONECTOMY Bilateral 1979   GALLBLADDER SURGERY     SPINE SURGERY  2010   protruding disc      Current Outpatient Medications:    acetaminophen  (TYLENOL ) 325 MG tablet, Take by mouth., Disp: , Rfl:    ALPRAZolam  (XANAX ) 0.5 MG tablet, TAKE 1 TABLET BY MOUTH AT BEDTIME AS NEEDED, Disp: 30 tablet, Rfl: 0   aspirin  81 MG tablet, Take 81 mg by mouth daily., Disp: , Rfl:    cholecalciferol (VITAMIN D3) 25 MCG (1000 UT) tablet, Take 2,000 Units by mouth daily., Disp: , Rfl:    diclofenac Sodium (VOLTAREN) 1 % GEL, Apply topically., Disp: , Rfl:    diltiazem (CARDIZEM CD) 360 MG 24 hr capsule, , Disp: , Rfl:    dofetilide  (TIKOSYN ) 250 MCG capsule, Take 1 capsule by mouth 2 (two) times daily., Disp: , Rfl:    ELIQUIS 5 MG TABS  tablet, Take 5 mg by mouth 2 (two) times daily., Disp: , Rfl:    furosemide  (LASIX ) 40 MG tablet, Take 40 mg by mouth daily. Take an extra tab in the afternoon if you notice increasing leg swelling or your weight goes up by more than 2 pounds in a day or 5 pounds in a week., Disp: , Rfl:    glipiZIDE  (GLUCOTROL ) 10 MG tablet, Take 10 mg by mouth 2 (two) times daily before a meal., Disp: , Rfl:    glucose blood (RELION PRIME TEST) test strip, 1 each by Other route See admin instructions., Disp: , Rfl:    indomethacin  (INDOCIN ) 50 MG capsule,  Take 1 capsule (50 mg total) by mouth 3 (three) times daily with meals for 5 days., Disp: 15 capsule, Rfl: 0   insulin  degludec (TRESIBA ) 100 UNIT/ML FlexTouch Pen, Inject 10 Units into the skin at bedtime., Disp: , Rfl:    Insulin  Pen Needle (GLOBAL EASY GLIDE PEN NEEDLES) 32G X 4 MM MISC, Use as directed to inject insulin  once daily., Disp: 200 each, Rfl: 3   lansoprazole  (PREVACID ) 30 MG capsule, Take 1 capsule (30 mg total) by mouth daily., Disp: 90 capsule, Rfl: 1   metoprolol succinate (TOPROL-XL) 50 MG 24 hr tablet, Take 1 tablet by mouth daily., Disp: , Rfl:    nitrofurantoin , macrocrystal-monohydrate, (MACROBID ) 100 MG capsule, Take 1 capsule (100 mg total) by mouth 2 (two) times daily for 7 days., Disp: 14 capsule, Rfl: 0   ondansetron  (ZOFRAN -ODT) 4 MG disintegrating tablet, Place 1 tablet under your tongue every 8 hours as needed, Disp: , Rfl: 0   pramipexole (MIRAPEX) 0.5 MG tablet, Take 0.5 mg by mouth 3 (three) times daily., Disp: , Rfl:    sacubitril-valsartan (ENTRESTO) 24-26 MG, Take 1 tablet by mouth 2 (two) times daily., Disp: , Rfl:    Semaglutide,0.25 or 0.5MG /DOS, 2 MG/3ML SOPN, Inject 0.5 mg into the skin once a week. 0.25mg  weekly x4 weeks, then increase to 0.5mg  weekly, Disp: , Rfl:    traMADol (ULTRAM) 50 MG tablet, Take by mouth., Disp: , Rfl:  Allergies  Allergen Reactions   Statins Other (See Comments)    myalgia   Valdecoxib Rash        Other Other (See Comments)   Zolpidem Other (See Comments)    Social History   Socioeconomic History   Marital status: Divorced    Spouse name: Not on file   Number of children: Not on file   Years of education: Not on file   Highest education level: Not on file  Occupational History   Not on file  Tobacco Use   Smoking status: Never   Smokeless tobacco: Never  Substance and Sexual Activity   Alcohol use: No   Drug use: No   Sexual activity: Not on file  Other Topics Concern   Not on file  Social History  Narrative   Not on file   Social Drivers of Health   Financial Resource Strain: Low Risk  (08/11/2023)   Received from [facility]   Overall Financial Resource Strain (CARDIA)    Difficulty of Paying Living Expenses: Not hard at all  Food Insecurity: Low Risk  (10/11/2023)   Received from [facility]   Hunger Vital Sign    Worried About Running Out of Food in the Last Year: Never true    Ran Out of Food in the Last Year: Never true  Transportation Needs: No Transportation Needs (10/11/2023)   Received  from [facility]    In the past 12 months, has lack of reliable transportation kept you from medical appointments, meetings, work or from getting things needed for daily living? : No  Physical Activity: Inactive (06/08/2023)   Exercise Vital Sign    Days of Exercise per Week: 0 days    Minutes of Exercise per Session: 0 min  Stress: No Stress Concern Present (06/08/2023)   [employer] of Occupational Health - Occupational Stress Questionnaire    Feeling of Stress : Not at all  Social Connections: Unknown (06/08/2023)   Social Connection and Isolation Panel [NHANES]    Frequency of Communication with Friends and Family: More than three times a week    Frequency of Social Gatherings with Friends and Family: More than three times a week    Attends Religious Services: Not on file    Active Member of Clubs or Organizations: Yes    Attends Banker Meetings: 1 to 4 times per year    Marital Status: Divorced  Intimate Partner Violence: Not At Risk (06/14/2023)   Humiliation, Afraid, Rape, and Kick questionnaire    Fear of Current or Ex-Partner: No    Emotionally Abused: No    Physically Abused: No    Sexually Abused: No    Family History  Problem Relation Age of Onset   Cancer Mother 23       leukemia    Heart disease Father 79       heart attack    Heart disease Sister      ROS: A complete ROS was performed with pertinent positives/negatives  noted in the HPI. The remainder of the ROS are negative.    OBJECTIVE:  VITALS per patient if applicable: There were no vitals filed for this visit. There is no height or weight on file to calculate BMI.   GENERAL: Alert and oriented. Appears well and in no acute distress. HEENT: Atraumatic. Conjunctiva clear. No obvious abnormalities on inspection of external nose and ears. NECK: Normal movements of the head and neck. LUNGS: On inspection, no signs of respiratory distress. Breathing rate appears normal. No obvious gross SOB, gasping or wheezing, and no conversational dyspnea. CV: No obvious cyanosis. MS: Moves all visible extremities without noticeable abnormality. PSYCH/NEURO: Pleasant and cooperative. No obvious depression or anxiety. Speech and thought processing grossly intact.  ASSESSMENT AND PLAN: 1. Hematuria, unspecified type (Primary) - Ambulatory referral to Urology -Continue Macrobid  until complete  2. Acute idiopathic gout of foot, unspecified laterality - indomethacin  (INDOCIN ) 50 MG capsule; Take 1 capsule (50 mg total) by mouth 3 (three) times daily with meals for 5 days.  Dispense: 15 capsule; Refill: 0  3. Pressure ulcer of left buttock, stage 2 (HCC) -Continue home health care and Mepilex dressings  4. Type 2 diabetes mellitus with hyperglycemia, with long-term current use of insulin  (HCC) -Patient has follow-up with pharmacy in 1 month -Continue current medication regimen, if Ozempic is tolerated at 0.5 mg dose, will increase patient to 1 mg once weekly in 1 month  5. Chronic heart failure, unspecified heart failure type (HCC) -Continue Lasix     I discussed the assessment and treatment plan with the patient. The patient was provided an opportunity to ask questions and all were answered. The patient agreed with the plan and demonstrated an understanding of the instructions.   The patient was advised to call back or seek an in-person evaluation if the symptoms  worsen or if the condition fails  to improve as anticipated.  Return in about 2 months (around 02/06/2024) for Chronic Condition follow up.  Rosina Senters, FNP

## 2023-12-13 NOTE — Telephone Encounter (Signed)
 Home Health order was sign by Gavin Kast NP and faxed on 12/13/2023. Paperwork was placed in [place] office charge folder.

## 2023-12-16 ENCOUNTER — Telehealth: Payer: Self-pay | Admitting: Internal Medicine

## 2023-12-16 NOTE — Telephone Encounter (Signed)
Patient dropped off document Home Health Certificate (Order ID [id number]), to be filled out by provider. Patient requested to send it back via Fax within 7-days. Document is located in providers tray at front office.Please advise at  [phone]

## 2023-12-16 NOTE — Telephone Encounter (Signed)
Duplicate form received  First copy of form faxed 12/16/23

## 2023-12-21 ENCOUNTER — Encounter: Payer: Self-pay | Admitting: Internal Medicine

## 2023-12-23 ENCOUNTER — Ambulatory Visit: Payer: PPO | Admitting: Urology

## 2024-01-03 ENCOUNTER — Other Ambulatory Visit: Payer: Self-pay

## 2024-01-03 NOTE — Progress Notes (Signed)
   01/03/2024  Patient ID: Tina Baker, female   DOB: 1946-06-13, 78 y.o.   MRN: [id number]  Subjective/Objective Telephone visit to follow-up on management of T2DM  Diabetes Management Plan -Current medications:  Ozempic 0.25mg  weekly, Tresiba 10 units at bedtime, glipizide 10mg  bid before meal(s) -Patient endorses tolerating Ozempic 0.5mg  weekly well with no adverse side effects -Patient does monitor home FBG- this morning was 190 -Endorses dietary choices likely causing recently elevated BG -Last A1c was 9.2% August 2024 -Patient does not endorse s/sx of hypoglycemia  Assessment/Plan  Diabetes Management Plan -Recommend increasing Ozempic to 1mg  weekly - continue current regimen; but once delivery date for 1mg  form [facility] is known, we will increase to 2 injections of 0.5mg  weekly -Continue Tresiba 10 units and glipizide 10mg  BID -Continue to monitor FBG daily and record -Patient with history of resistant UTI currently being followed by Urology-would not recommend SGLT2 therapy at this time -Completing [facility] PAP order change form for Ozempic 1mg  weekly and faxing to PCP to sign and fax into [facility]  Follow-up:  Will check on status of Ozempic dose increase next week, notify patient, and schedule follow-up  Lenna Gilford, PharmD, DPLA

## 2024-01-04 ENCOUNTER — Other Ambulatory Visit: Payer: Self-pay | Admitting: Internal Medicine

## 2024-01-04 ENCOUNTER — Ambulatory Visit: Admitting: Internal Medicine

## 2024-01-04 DIAGNOSIS — F411 Generalized anxiety disorder: Secondary | ICD-10-CM

## 2024-01-04 NOTE — Telephone Encounter (Signed)
 Last Ov 12/02/23 Filled 12/08/23

## 2024-01-07 NOTE — Progress Notes (Signed)
 Hi Cheryl,  I received the paperwork. I have signed it and will be faxing to [facility] today. Thanks

## 2024-01-12 ENCOUNTER — Telehealth: Payer: Self-pay

## 2024-01-12 NOTE — Progress Notes (Signed)
   01/12/2024  Patient ID: Tina Baker, female   DOB: 1945/11/30, 78 y.o.   MRN: [id number]  Contacted [facility] PAP to check on status of Ozempic dose increase to 1mg  weekly.  Approved 01/10/24 and should arrive at PCP office 10-14 business days from then.  Sending patient a MyChart message to make aware- if medication does not arrive by 3/28, we should be able to get 30 day voucher to fill 1 month supply at her local pharmacy.  Lenna Gilford, PharmD, DPLA

## 2024-01-13 ENCOUNTER — Ambulatory Visit: Payer: PPO | Admitting: Urology

## 2024-01-14 ENCOUNTER — Ambulatory Visit: Admitting: Internal Medicine

## 2024-01-19 ENCOUNTER — Ambulatory Visit (INDEPENDENT_AMBULATORY_CARE_PROVIDER_SITE_OTHER): Admitting: Internal Medicine

## 2024-01-19 ENCOUNTER — Encounter: Payer: Self-pay | Admitting: Internal Medicine

## 2024-01-19 VITALS — BP 116/66 | HR 95 | Temp 98.2°F

## 2024-01-19 DIAGNOSIS — J22 Unspecified acute lower respiratory infection: Secondary | ICD-10-CM | POA: Diagnosis not present

## 2024-01-19 LAB — POCT INFLUENZA A/B
Influenza A, POC: NEGATIVE
Influenza B, POC: NEGATIVE

## 2024-01-19 LAB — POC COVID19 BINAXNOW: SARS Coronavirus 2 Ag: NEGATIVE

## 2024-01-19 MED ORDER — METHYLPREDNISOLONE SODIUM SUCC 40 MG IJ SOLR
80.0000 mg | Freq: Once | INTRAMUSCULAR | Status: AC
  Administered 2024-01-19: 80 mg via INTRAMUSCULAR

## 2024-01-19 MED ORDER — METHYLPREDNISOLONE SODIUM SUCC 125 MG IJ SOLR: 80.0000 mg | Freq: Once | INTRAMUSCULAR | Status: DC

## 2024-01-19 MED ORDER — DOXYCYCLINE HYCLATE 100 MG PO TABS: 100.0000 mg | ORAL_TABLET | Freq: Two times a day (BID) | ORAL | 0 refills | Status: AC

## 2024-01-19 NOTE — Progress Notes (Signed)
 [facility] PRIMARY CARE [facility] [address] [place] [address] Dept: [phone] Dept Fax: [phone]  Acute Care Office Visit  Subjective:   Tina Baker September 02, 1946 01/19/2024  Chief Complaint  Patient presents with   Cough   Wheezing    Started over the weekend coughing mucus     HPI: Discussed the use of AI scribe software for clinical note transcription with the patient, who gave verbal consent to proceed.  History of Present Illness   The patient, with a history of chronic lung disease, presents with a cough and wheezing that has been progressively worsening. The symptoms began after a sore throat, which was self-treated with Tylenol Allergy Sinus. The sore throat resolved, but a cough developed a few days later. The patient denies shortness of breath but admits to occasional difficulty breathing. She has been producing yellowish sputum intermittently. She denies fever but reports sinus congestion. She describes a 'weird' sensation in the chest last night, but denies pain or palpitations. She also reports diarrhea (3 episodes) this morning only. The patient has been taking Coricidin for the cough, which seems to help. She has not been tested for COVID-19 or influenza. The patient also reports missing a dose of her cardiac medications last night (Tikosyn, Metoprolol, and Cardizem), which may have contributed to the unusual chest sensation. She is supposed to be on 3L [place] continuous, but has only been using this as needed. She noticed her oxygen dropped to 88% last night, which prompted her to re-start her oxygen therapy. Oxygen levels with 3L [place] have been consistently above 90% now.     Denies fever,  bodyaches, vomiting, CP , heart palpitations, worsening leg swelling.   The following portions of the patient's history were reviewed and updated as appropriate: past medical history, past surgical history, family history, social history,  allergies, medications, and problem list.   Patient Active Problem List   Diagnosis Date Noted   Impaired functional mobility, balance, gait, and endurance 03/31/2023   Irritant contact dermatitis 03/31/2023   Arthralgia of right wrist 03/31/2023   Lumbar radiculopathy 02/22/2023   Acute idiopathic gout of right foot 02/02/2023   Chronic bilateral low back pain without sciatica 01/05/2023   Bilateral hip pain 01/05/2023   Impaired ambulation 01/05/2023   Weakness of both lower extremities 01/05/2023   Tinea unguium 12/01/2022   Heart failure (HCC) 04/09/2022   Acute on chronic heart failure with preserved ejection fraction (HFpEF) (HCC) 04/01/2022   Cryptogenic cirrhosis (HCC) 02/19/2022   Hypoglycemia 11/13/2021   Chronic respiratory failure with hypoxia (HCC) 11/12/2021   CAP (community acquired pneumonia) 11/12/2021   Subtherapeutic anticoagulation 11/12/2021   TIA (transient ischemic attack) 11/07/2021   Aspiration pneumonia (HCC) 09/17/2021   RSV (respiratory syncytial virus infection) 09/17/2021   Senile nuclear sclerosis 03/18/2021   Hypertrophy of bone, right ankle and foot 01/24/2021   Restrictive lung disease 05/17/2020   History of colon polyps 12/11/2019   Hyperbilirubinemia 12/11/2019   Thrombocytopenia (HCC) 12/11/2019   Pulmonary hypertension (HCC) 11/24/2019   Bilateral pleural effusion 10/25/2019   Primary osteoarthritis of left hip 06/14/2019   Trochanteric bursitis of left hip 06/14/2019   Intolerance of continuous positive airway pressure (CPAP) ventilation 05/03/2019   Primary osteoarthritis of left knee 05/01/2019   Primary osteoarthritis of right knee 05/01/2019   Bilateral carpal tunnel syndrome 05/01/2019   Trigger thumb of left hand 05/01/2019   ANA positive 02/23/2019   Rheumatoid factor positive 02/23/2019   GI bleed  02/17/2019   Generalized anxiety disorder 11/27/2018   DNR (do not resuscitate) 11/27/2018   Elevated d-dimer 11/27/2018    Hyponatremia 11/27/2018   Major depressive disorder with current active episode 11/27/2018   Obesity (BMI 30-39.9) 11/27/2018   Essential hypertension 11/27/2018   Acute hypoxemic respiratory failure (HCC) 11/27/2018   Obstructive sleep apnea (adult) (pediatric) 08/12/2018   Acute pulmonary edema (HCC) 07/08/2018   Recurrent major depressive disorder, in partial remission (HCC) 04/13/2018   Varicose veins of left lower extremity with pain 04/13/2018   Constipation 02/19/2018   Long term (current) use of anticoagulants 12/31/2015   Acromioclavicular joint arthritis 12/31/2015   Subacromial bursitis 12/31/2015   Allergic rhinitis 11/13/2015   Benign essential hypertension 11/13/2015   Chronic tension-type headache, not intractable 11/13/2015   DDD (degenerative disc disease), lumbosacral 11/13/2015   Gastroesophageal reflux disease without esophagitis 11/13/2015   Paroxysmal atrial fibrillation (HCC) 11/13/2015   RLS (restless legs syndrome) 11/13/2015   Vitamin D deficiency 11/13/2015   Insomnia 11/13/2015   Class 1 obesity due to excess calories with serious comorbidity and body mass index (BMI) of 33.0 to 33.9 in adult 11/13/2015   Contracture of tendon sheath 11/13/2015   Spinal stenosis of lumbar region without neurogenic claudication 11/13/2015   Gout, arthritis 11/13/2015   Iron deficiency anemia due to chronic blood loss 11/13/2015   Lumbar herniated disc 11/13/2015   Rotator cuff tendinitis 11/13/2015   Swelling 09/30/2015   Chest discomfort 07/18/2015   Hospital discharge follow-up 07/04/2015   Leukocytosis 06/27/2015   Anemia, unspecified 06/26/2015   Type 2 diabetes mellitus, without long-term current use of insulin (HCC) 06/25/2015   Venous insufficiency, peripheral 01/09/2015   Spondylolisthesis of lumbar region 11/03/2012   Past Medical History:  Diagnosis Date   Arthritis    Atrial fibrillation (HCC)    Diabetes mellitus    Hyperlipidemia    Hypertension     Past Surgical History:  Procedure Laterality Date   ABDOMINAL HYSTERECTOMY  2001   BUNIONECTOMY Bilateral 1979   GALLBLADDER SURGERY     SPINE SURGERY  2010   protruding disc    Family History  Problem Relation Age of Onset   Cancer Mother 40       leukemia    Heart disease Father 55       heart attack    Heart disease Sister     Current Outpatient Medications:    albuterol (VENTOLIN HFA) 108 (90 Base) MCG/ACT inhaler, Inhale into the lungs., Disp: , Rfl:    ALPRAZolam (XANAX) 0.5 MG tablet, TAKE 1 TABLET BY MOUTH AT BEDTIME AS NEEDED, Disp: 30 tablet, Rfl: 1   cholecalciferol (VITAMIN D3) 25 MCG (1000 UT) tablet, Take 2,000 Units by mouth daily., Disp: , Rfl:    diclofenac Sodium (VOLTAREN) 1 % GEL, Apply topically., Disp: , Rfl:    diltiazem (CARDIZEM CD) 360 MG 24 hr capsule, , Disp: , Rfl:    dofetilide (TIKOSYN) 250 MCG capsule, Take 1 capsule by mouth 2 (two) times daily., Disp: , Rfl:    doxycycline (VIBRA-TABS) 100 MG tablet, Take 1 tablet (100 mg total) by mouth 2 (two) times daily for 7 days., Disp: 14 tablet, Rfl: 0   ELIQUIS 5 MG TABS tablet, Take 5 mg by mouth 2 (two) times daily., Disp: , Rfl:    furosemide (LASIX) 40 MG tablet, Take 40 mg by mouth daily. Take an extra tab in the afternoon if you notice increasing leg swelling or your weight goes up  by more than 2 pounds in a day or 5 pounds in a week., Disp: , Rfl:    glipiZIDE (GLUCOTROL) 10 MG tablet, Take 10 mg by mouth 2 (two) times daily before a meal., Disp: , Rfl:    glucose blood (RELION PRIME TEST) test strip, 1 each by Other route See admin instructions., Disp: , Rfl:    insulin degludec (TRESIBA) 100 UNIT/ML FlexTouch Pen, Inject 10 Units into the skin at bedtime., Disp: , Rfl:    Insulin Pen Needle (GLOBAL EASY GLIDE PEN NEEDLES) 32G X 4 MM MISC, Use as directed to inject insulin once daily., Disp: 200 each, Rfl: 3   lansoprazole (PREVACID) 30 MG capsule, Take 1 capsule (30 mg total) by mouth daily.,  Disp: 90 capsule, Rfl: 1   metoprolol succinate (TOPROL-XL) 50 MG 24 hr tablet, Take 1 tablet by mouth daily., Disp: , Rfl:    ondansetron (ZOFRAN-ODT) 4 MG disintegrating tablet, Place 1 tablet under your tongue every 8 hours as needed, Disp: , Rfl: 0   pramipexole (MIRAPEX) 0.5 MG tablet, Take 0.5 mg by mouth 3 (three) times daily., Disp: , Rfl:    sacubitril-valsartan (ENTRESTO) 24-26 MG, Take 1 tablet by mouth 2 (two) times daily., Disp: , Rfl:    Semaglutide, 1 MG/DOSE, (OZEMPIC, 1 MG/DOSE,) 4 MG/3ML SOPN, Inject 1 mg into the skin once a week., Disp: , Rfl:    traMADol (ULTRAM) 50 MG tablet, Take by mouth., Disp: , Rfl:    acetaminophen (TYLENOL) 325 MG tablet, Take by mouth., Disp: , Rfl:    aspirin 81 MG tablet, Take 81 mg by mouth daily. (Patient not taking: Reported on 01/19/2024), Disp: , Rfl:   Current Facility-Administered Medications:    methylPREDNISolone sodium succinate (SOLU-MEDROL) 40 mg/mL injection 80 mg, 80 mg, Intramuscular, Once,  Allergies  Allergen Reactions   Statins Other (See Comments)    myalgia   Valdecoxib Rash        Other Other (See Comments)   Zolpidem Other (See Comments)     ROS: A complete ROS was performed with pertinent positives/negatives noted in the HPI. The remainder of the ROS are negative.    Objective:   Today's Vitals   01/19/24 1049 01/19/24 1135  BP: 116/66   Pulse: (!) 111 95  Temp: 98.2 F (36.8 C)   TempSrc: Temporal   SpO2: 92% 90%    GENERAL: Well-appearing, in NAD. Well nourished.  SKIN: Pink, warm and dry. No rash, lesion, ulceration, or ecchymoses.  HEENT:    HEAD: Normocephalic, non-traumatic.  EYES: Conjunctive pink without exudate.  NOSE: Septum midline w/o deformity. Nares patent, mucosa pink and inflamed with clear-white drainage. Frontal sinus tenderness.  THROAT: Uvula midline. Posterior oropharynx mild redness. Tonsils non-inflamed w/o exudate. Mucus membranes pink and moist.  NECK: Trachea midline. Full ROM  w/o pain or tenderness. No lymphadenopathy.  RESPIRATORY: Chest wall symmetrical. Respirations even and non-labored. Expiratory wheezing in RUL, RLL, LLL.  No crackles or rhonchi.  CARDIAC: S1, S2 present, regular rate, irregular rhythm Peripheral pulses 2+ bilaterally.  MSK: Muscle tone and strength appropriate for age.  EXTREMITIES: Without clubbing, cyanosis. 1+ pitting edema BLE  NEUROLOGIC: No motor or sensory deficits. Steady, even gait.  PSYCH/MENTAL STATUS: Alert, oriented x 3. Cooperative, appropriate mood and affect.    Results for orders placed or performed in visit on 01/19/24  POCT Influenza A/B  Result Value Ref Range   Influenza A, POC Negative Negative   Influenza B, POC Negative Negative  POC COVID-19 BinaxNow  Result Value Ref Range   SARS Coronavirus 2 Ag Negative Negative      Assessment & Plan:  Assessment and Plan    Lower Respiratory Infection -  COVID and flu tests negative.  - Administer Solu-Medrol 80 mg intramuscular injection today. - Prescribe doxycycline 100 mg bid for 7 days. - Advise albuterol inhaler 2 puffs every 4 to 6 hours as needed for dyspnea or wheezing. - Continue oxygen therapy at 3 liters nasal cannula. - If oxygen saturation falls below 90% on three liters with worsening dyspnea, proceed to emergency room. - If symptoms persist over 2-3 days but oxygen saturation remains stable, contact primary care for chest x-ray order. - Use Coricidin HBP for cough.       Meds ordered this encounter  Medications   doxycycline (VIBRA-TABS) 100 MG tablet    Sig: Take 1 tablet (100 mg total) by mouth 2 (two) times daily for 7 days.    Dispense:  14 tablet    Refill:  0    Supervising Provider:   Garnette Gunner [[id number]]   DISCONTD: methylPREDNISolone sodium succinate (SOLU-MEDROL) 125 mg/2 mL injection 80 mg   methylPREDNISolone sodium succinate (SOLU-MEDROL) 40 mg/mL injection 80 mg   Orders Placed This Encounter  Procedures   POCT  Influenza A/B   POC COVID-19 BinaxNow    Previously tested for COVID-19:   No    Resident in a congregate (group) care setting:   No    Employed in healthcare setting:   No    Pregnant:   No   Lab Orders         POCT Influenza A/B         POC COVID-19 BinaxNow     No images are attached to the encounter or orders placed in the encounter.  Return for Scheduled Routine Office Visits and as needed.   Salvatore Decent, FNP

## 2024-01-19 NOTE — Patient Instructions (Signed)
 Take Doxycycline as prescribed  Use Albuterol Inhaler 2 puffs every 4-6 hours as needed for shortness of breath or wheezing  Continue Coricidin HBP for cough  Continue 3 liters of oxygen     If symptoms do not improve over the next 2 days, but your oxygen level remains above 90%, call me and I will place an order for chest xray   If symptoms worsen or oxygen level falls below 90% at rest , go to Emergency Room.   If you develop chest pain, heart palpitations, or heart rate is above 120 at rest, go to the emergency room.

## 2024-01-20 ENCOUNTER — Telehealth: Payer: Self-pay

## 2024-01-20 NOTE — Telephone Encounter (Signed)
 Copied from CRM [id number]. Topic: Clinical - Home Health Verbal Orders >> Jan 20, 2024  4:09 PM Ernst Spell wrote: Caller/Agency: [facility] Callback Number: [phone] Service Requested: Skilled Nursing Frequency: extended 1x week for 4 weeks Any new concerns about the patient? Yes 1. Blood sugar was 330. Received steroid shot for upper respiratory infectio, pt believes this may be the cause.  3. Weight increase to 222lbs, 5 lb weight gain, no swelling 4. Heart rate was 107, (parameters are 105)

## 2024-01-20 NOTE — Telephone Encounter (Signed)
 Forwarding message below

## 2024-01-21 NOTE — Telephone Encounter (Signed)
 Ok for verbal orders for home health below:  Service Requested: Skilled Nursing Frequency: extended 1x week for 4 weeks     Any new concerns about the patient? Yes 1. Blood sugar was 330. Received steroid shot for upper respiratory infectio, pt believes this may be the cause. Yes, steroid injection is the cause, she can increase her insulin by 3 units every 3 days for fasting blood sugars above 200. Then can resume normal insulin dose once blood sugar is under control and effects of steroid have resolved.   3. Weight increase to 222lbs, 5 lb weight gain, no swelling - she can take an additional tablet of her Lasix 40mg  for weight gain.  4. Heart rate was 107, (parameters are 105) - no concerns at this time, continue to monitor.

## 2024-01-21 NOTE — Telephone Encounter (Signed)
 Attempted to call and did not leave voicemail because wasn't sure if is the right #,  I will try again later

## 2024-01-21 NOTE — Telephone Encounter (Signed)
 Tina Baker returned call and was given verbal orders

## 2024-01-21 NOTE — Telephone Encounter (Signed)
 Second attempt to reach [facility] left voicemail

## 2024-01-26 ENCOUNTER — Telehealth: Payer: Self-pay | Admitting: Internal Medicine

## 2024-01-26 NOTE — Telephone Encounter (Signed)
 Pt Ozempic arrived to the office. 4 boxes. Placed in fridge. LVM for pt to pick up at her convenience.

## 2024-01-26 NOTE — Telephone Encounter (Signed)
 Patient dropped off document Home Health Certificate (Order ID [id number]), to be filled out by provider. Patient requested to send it back via Fax within 7-days. Document is located in providers tray at front office.Please advise at Mobile [phone] (mobile)   Home health order by fax. I put in the dr box

## 2024-01-27 NOTE — Telephone Encounter (Signed)
 Forms faxed

## 2024-02-01 ENCOUNTER — Ambulatory Visit: Admitting: Internal Medicine

## 2024-02-01 ENCOUNTER — Ambulatory Visit: Payer: Self-pay

## 2024-02-01 ENCOUNTER — Telehealth: Payer: Self-pay | Admitting: Internal Medicine

## 2024-02-01 NOTE — Telephone Encounter (Signed)
 Called pt to let her know we will be cancelling her appt this afternoon with PCP, as she needs to go to ED as advised by NT and PCP. Pt understood.

## 2024-02-01 NOTE — Telephone Encounter (Signed)
 FYI: This call has been transferred to triage nurse: the Triage Nurse. Once the result note has been entered staff can address the message at that time.  Patient called in with the following symptoms:  Red Word:chest pain. Pt scheduled an appt with Morrie Sheldon for   I have been having pain around my chest for two or three days and have a cough with it. On 02/01/24. I called pt and transferred her over to nurse triage.    Please advise at [facility] [phone]  Message is routed to Provider Pool.

## 2024-02-01 NOTE — Telephone Encounter (Signed)
 Tina Baker, [facility] transferred pt to RN.   Chief Complaint: CP Symptoms: CP Frequency: 2-3 days Pertinent Negatives: Patient denies SOB, fever, diaphoresis, Disposition: [x] ED /[] Urgent Care (no appt availability in office) / [] Appointment(In office/virtual)/ []  [facility]/ [] Home Care/ [] Refused Recommended Disposition /[] [place] [facility]/ []  Follow-up with PCP Additional Notes: Pt c/o CP intermittently for 2-3 days. States that it has become more prevalent. Pt states that she ahs a hx of CHF and pulm HTN. Denies SOB, denies diaphoresis. Pt states that she did have surgery in Nov and is still recovering from that. Pt states that she has never had a blood clot. Pt states that her "vitals are stable", HR 94.  Advised ED, pt agreeable.   Reason for Disposition  [1] Chest pain (or "angina") comes and goes AND [2] is happening more often (increasing in frequency) or getting worse (increasing in severity)  (Exception: Chest pains that last only a few seconds.)  Answer Assessment - Initial Assessment Questions 1. LOCATION: "Where does it hurt?"       Center  2. RADIATION: "Does the pain go anywhere else?" (e.g., into neck, jaw, arms, back)     Radiates outer portions of chest 3. ONSET: "When did the chest pain begin?" (Minutes, hours or days)      3-4 days but getting worse 4. PATTERN: "Does the pain come and go, or has it been constant since it started?"  "Does it get worse with exertion?"      constant 5. DURATION: "How long does it last" (e.g., seconds, minutes, hours)     Been ongoing for 3-4 days 6. SEVERITY: "How bad is the pain?"  (e.g., Scale 1-10; mild, moderate, or severe)    - MILD (1-3): doesn't interfere with normal activities     - MODERATE (4-7): interferes with normal activities or awakens from sleep    - SEVERE (8-10): excruciating pain, unable to do any normal activities       5 7. CARDIAC RISK FACTORS: "Do you have any history of heart problems or risk  factors for heart disease?" (e.g., angina, prior heart attack; diabetes, high blood pressure, high cholesterol, smoker, or strong family history of heart disease)     Hx of CHF, pulmonary HTN 8. PULMONARY RISK FACTORS: "Do you have any history of lung disease?"  (e.g., blood clots in lung, asthma, emphysema, birth control pills)     denies 9. CAUSE: "What do you think is causing the chest pain?"     denies 10. OTHER SYMPTOMS: "Do you have any other symptoms?" (e.g., dizziness, nausea, vomiting, sweating, fever, difficulty breathing, cough)       cough  Protocols used: Chest Pain-A-AH

## 2024-02-01 NOTE — Telephone Encounter (Signed)
 Noted.

## 2024-02-03 ENCOUNTER — Encounter: Payer: Self-pay | Admitting: Internal Medicine

## 2024-02-03 DIAGNOSIS — F411 Generalized anxiety disorder: Secondary | ICD-10-CM

## 2024-02-07 MED ORDER — ALPRAZOLAM 0.5 MG PO TABS: ORAL_TABLET | ORAL | 1 refills | Status: DC

## 2024-02-08 ENCOUNTER — Telehealth: Payer: Self-pay

## 2024-02-08 ENCOUNTER — Ambulatory Visit (INDEPENDENT_AMBULATORY_CARE_PROVIDER_SITE_OTHER): Payer: PPO | Admitting: Internal Medicine

## 2024-02-08 ENCOUNTER — Encounter: Payer: Self-pay | Admitting: Internal Medicine

## 2024-02-08 VITALS — BP 112/62 | HR 81 | Temp 98.3°F | Ht 66.5 in | Wt 227.6 lb

## 2024-02-08 DIAGNOSIS — E1165 Type 2 diabetes mellitus with hyperglycemia: Secondary | ICD-10-CM | POA: Diagnosis not present

## 2024-02-08 DIAGNOSIS — I509 Heart failure, unspecified: Secondary | ICD-10-CM | POA: Diagnosis not present

## 2024-02-08 DIAGNOSIS — I480 Paroxysmal atrial fibrillation: Secondary | ICD-10-CM

## 2024-02-08 DIAGNOSIS — Z794 Long term (current) use of insulin: Secondary | ICD-10-CM | POA: Diagnosis not present

## 2024-02-08 DIAGNOSIS — J9611 Chronic respiratory failure with hypoxia: Secondary | ICD-10-CM

## 2024-02-08 DIAGNOSIS — J22 Unspecified acute lower respiratory infection: Secondary | ICD-10-CM

## 2024-02-08 DIAGNOSIS — R310 Gross hematuria: Secondary | ICD-10-CM

## 2024-02-08 LAB — POCT GLYCOSYLATED HEMOGLOBIN (HGB A1C): Hemoglobin A1C: 8.4 % — AB (ref 4.0–5.6)

## 2024-02-08 MED ORDER — INSULIN DEGLUDEC 100 UNIT/ML ~~LOC~~ SOPN: 13.0000 [IU] | PEN_INJECTOR | Freq: Every day | SUBCUTANEOUS | Status: DC

## 2024-02-08 MED ORDER — DOFETILIDE 250 MCG PO CAPS: 250.0000 ug | ORAL_CAPSULE | Freq: Two times a day (BID) | ORAL | 0 refills | Status: DC

## 2024-02-08 NOTE — Progress Notes (Signed)
 [facility] [address] [place] [address] Dept: [phone] Dept Fax: [phone]    Subjective:   Tina Baker 04/19/1946 02/08/2024  Chief Complaint  Patient presents with   Hospitalization Follow-up    HPI: Tina Baker is a 78 yo F who presents for follow up of chronic conditions. She has a PMHx significant for T2DM, CHF, chronic respiratory failure with hypoxia on 3L O2 [place], OSA, PAF. She is managed by pulmonology and cardiology. She is currently taking Glipizide 10mg  BID, Ozempic 0.5mg  weekly, Tresiba 10 units daily for T2DM. She does check her blood sugars daily. Currently reports > 200 AM blood sugars. Was recently placed on steroids s/p hospital visit. She will be increasing to 1mg  Ozempic next week. She has been compliant with medication regimen. She is currently under the care of urology for gross hematuria. Patient was placed on Macrobid for persistent UTI, has completed ABX. Reports hematuria and bladder discomfort is improving. Needs to f/u with urology.   She was recently seen in the hospital for chest pain. She is doing well since then, but reports a discomfort in her epigastric / sternal region onset today. Occurs when she takes a deep breath in. Associated cough. She is currently on Augmentin for respiratory infection.     She is out of her tikosyn for afib, she has upcoming appt with dr. Jarrett Ables office April 21st. Needs refill until then.     The following portions of the patient's history were reviewed and updated as appropriate: past medical history, past surgical history, family history, social history, allergies, medications, and problem list.   Patient Active Problem List   Diagnosis Date Noted   Impaired functional mobility, balance, gait, and endurance 03/31/2023   Irritant contact dermatitis 03/31/2023   Arthralgia of right wrist 03/31/2023   Lumbar radiculopathy 02/22/2023   Acute  idiopathic gout of right foot 02/02/2023   Chronic bilateral low back pain without sciatica 01/05/2023   Bilateral hip pain 01/05/2023   Impaired ambulation 01/05/2023   Weakness of both lower extremities 01/05/2023   Tinea unguium 12/01/2022   Heart failure (HCC) 04/09/2022   Acute on chronic heart failure with preserved ejection fraction (HFpEF) (HCC) 04/01/2022   Cryptogenic cirrhosis (HCC) 02/19/2022   Hypoglycemia 11/13/2021   Chronic respiratory failure with hypoxia (HCC) 11/12/2021   CAP (community acquired pneumonia) 11/12/2021   Subtherapeutic anticoagulation 11/12/2021   TIA (transient ischemic attack) 11/07/2021   Aspiration pneumonia (HCC) 09/17/2021   RSV (respiratory syncytial virus infection) 09/17/2021   Senile nuclear sclerosis 03/18/2021   Hypertrophy of bone, right ankle and foot 01/24/2021   Restrictive lung disease 05/17/2020   History of colon polyps 12/11/2019   Hyperbilirubinemia 12/11/2019   Thrombocytopenia (HCC) 12/11/2019   Pulmonary hypertension (HCC) 11/24/2019   Bilateral pleural effusion 10/25/2019   Primary osteoarthritis of left hip 06/14/2019   Trochanteric bursitis of left hip 06/14/2019   Intolerance of continuous positive airway pressure (CPAP) ventilation 05/03/2019   Primary osteoarthritis of left knee 05/01/2019   Primary osteoarthritis of right knee 05/01/2019   Bilateral carpal tunnel syndrome 05/01/2019   Trigger thumb of left hand 05/01/2019   ANA positive 02/23/2019   Rheumatoid factor positive 02/23/2019   GI bleed 02/17/2019   Generalized anxiety disorder 11/27/2018   DNR (do not resuscitate) 11/27/2018   Elevated d-dimer 11/27/2018   Hyponatremia 11/27/2018   Major depressive disorder with current active episode 11/27/2018   Obesity (BMI 30-39.9) 11/27/2018  Essential hypertension 11/27/2018   Acute hypoxemic respiratory failure (HCC) 11/27/2018   Obstructive sleep apnea (adult) (pediatric) 08/12/2018   Acute pulmonary edema  (HCC) 07/08/2018   Recurrent major depressive disorder, in partial remission (HCC) 04/13/2018   Varicose veins of left lower extremity with pain 04/13/2018   Constipation 02/19/2018   Long term (current) use of anticoagulants 12/31/2015   Acromioclavicular joint arthritis 12/31/2015   Subacromial bursitis 12/31/2015   Allergic rhinitis 11/13/2015   Benign essential hypertension 11/13/2015   Chronic tension-type headache, not intractable 11/13/2015   DDD (degenerative disc disease), lumbosacral 11/13/2015   Gastroesophageal reflux disease without esophagitis 11/13/2015   Paroxysmal atrial fibrillation (HCC) 11/13/2015   RLS (restless legs syndrome) 11/13/2015   Vitamin D deficiency 11/13/2015   Insomnia 11/13/2015   Class 1 obesity due to excess calories with serious comorbidity and body mass index (BMI) of 33.0 to 33.9 in adult 11/13/2015   Contracture of tendon sheath 11/13/2015   Spinal stenosis of lumbar region without neurogenic claudication 11/13/2015   Gout, arthritis 11/13/2015   Iron deficiency anemia due to chronic blood loss 11/13/2015   Lumbar herniated disc 11/13/2015   Rotator cuff tendinitis 11/13/2015   Swelling 09/30/2015   Chest discomfort 07/18/2015   Hospital discharge follow-up 07/04/2015   Leukocytosis 06/27/2015   Anemia, unspecified 06/26/2015   Type 2 diabetes mellitus, without long-term current use of insulin (HCC) 06/25/2015   Venous insufficiency, peripheral 01/09/2015   Spondylolisthesis of lumbar region 11/03/2012   Past Medical History:  Diagnosis Date   Arthritis    Atrial fibrillation (HCC)    Diabetes mellitus    Hyperlipidemia    Hypertension    Past Surgical History:  Procedure Laterality Date   ABDOMINAL HYSTERECTOMY  2001   BUNIONECTOMY Bilateral 1979   GALLBLADDER SURGERY     SPINE SURGERY  2010   protruding disc    Family History  Problem Relation Age of Onset   Cancer Mother 76       leukemia    Heart disease Father 49        heart attack    Heart disease Sister     Current Outpatient Medications:    acetaminophen (TYLENOL) 325 MG tablet, Take by mouth., Disp: , Rfl:    albuterol (VENTOLIN HFA) 108 (90 Base) MCG/ACT inhaler, Inhale into the lungs., Disp: , Rfl:    ALPRAZolam (XANAX) 0.5 MG tablet, Take 1 - 2 tablets by mouth at bedtime as needed, Disp: 60 tablet, Rfl: 1   amoxicillin-clavulanate (AUGMENTIN) 875-125 MG tablet, Take by mouth., Disp: , Rfl:    aspirin 81 MG tablet, Take 81 mg by mouth daily., Disp: , Rfl:    cholecalciferol (VITAMIN D3) 25 MCG (1000 UT) tablet, Take 2,000 Units by mouth daily., Disp: , Rfl:    diclofenac Sodium (VOLTAREN) 1 % GEL, Apply topically., Disp: , Rfl:    diltiazem (CARDIZEM CD) 360 MG 24 hr capsule, , Disp: , Rfl:    ELIQUIS 5 MG TABS tablet, Take 5 mg by mouth 2 (two) times daily., Disp: , Rfl:    furosemide (LASIX) 40 MG tablet, Take 40 mg by mouth daily. Take an extra tab in the afternoon if you notice increasing leg swelling or your weight goes up by more than 2 pounds in a day or 5 pounds in a week., Disp: , Rfl:    glipiZIDE (GLUCOTROL) 10 MG tablet, Take 10 mg by mouth 2 (two) times daily before a meal., Disp: , Rfl:  glucose blood (RELION PRIME TEST) test strip, 1 each by Other route See admin instructions., Disp: , Rfl:    Insulin Pen Needle (GLOBAL EASY GLIDE PEN NEEDLES) 32G X 4 MM MISC, Use as directed to inject insulin once daily., Disp: 200 each, Rfl: 3   lansoprazole (PREVACID) 30 MG capsule, Take 1 capsule (30 mg total) by mouth daily., Disp: 90 capsule, Rfl: 1   metoprolol succinate (TOPROL-XL) 50 MG 24 hr tablet, Take 1 tablet by mouth daily., Disp: , Rfl:    ondansetron (ZOFRAN-ODT) 4 MG disintegrating tablet, Place 1 tablet under your tongue every 8 hours as needed, Disp: , Rfl: 0   OXYGEN, POC at  3 L via nasal cannula (stationary concentrator) with activity and 2 liters at night .   LON: 99 months See attached testing. DME provider:, Disp: , Rfl:     pramipexole (MIRAPEX) 0.5 MG tablet, Take 0.5 mg by mouth 3 (three) times daily., Disp: , Rfl:    sacubitril-valsartan (ENTRESTO) 24-26 MG, Take 1 tablet by mouth 2 (two) times daily., Disp: , Rfl:    Semaglutide, 1 MG/DOSE, (OZEMPIC, 1 MG/DOSE,) 4 MG/3ML SOPN, Inject 1 mg into the skin once a week., Disp: , Rfl:    traMADol (ULTRAM) 50 MG tablet, Take by mouth., Disp: , Rfl:    dofetilide (TIKOSYN) 250 MCG capsule, Take 1 capsule (250 mcg total) by mouth 2 (two) times daily., Disp: 60 capsule, Rfl: 0   insulin degludec (TRESIBA) 100 UNIT/ML FlexTouch Pen, Inject 13 Units into the skin at bedtime., Disp: , Rfl:  Allergies  Allergen Reactions   Statins Other (See Comments)    myalgia   Valdecoxib Rash        Other Other (See Comments)   Zolpidem Other (See Comments)     ROS: A complete ROS was performed with pertinent positives/negatives noted in the HPI. The remainder of the ROS are negative.    Objective:   Today's Vitals   02/08/24 1312  BP: 112/62  Pulse: 81  Temp: 98.3 F (36.8 C)  TempSrc: Temporal  SpO2: 93%  Weight: 227 lb 9.6 oz (103.2 kg)  Height: 5' 6.5" (1.689 m)    GENERAL: Well-appearing, in NAD. Well nourished.  SKIN: Pink, warm and dry. Thickened and yellow discoloration of toenails NECK: Trachea midline. Full ROM w/o pain or tenderness. No lymphadenopathy.  RESPIRATORY: Chest wall symmetrical. Respirations even and non-labored. Breath sounds clear to auscultation bilaterally.  CARDIAC: S1, S2 present, regular rate and rhythm. Peripheral pulses 2+ bilaterally.  EXTREMITIES: Without clubbing, cyanosis. RLE 2+ edema - she states she has not taken her lasix today NEUROLOGIC: Sensory exam of the foot is normal, tested with the monofilament. Good pulses, no lesions or ulcers, good peripheral pulses. PSYCH/MENTAL STATUS: Alert, oriented x 3. Cooperative, appropriate mood and affect.   Health Maintenance Due  Topic Date Due   FOOT EXAM  Never done   Hepatitis C  Screening  Never done   Zoster Vaccines- Shingrix (1 of 2) 11/05/1965   COVID-19 Vaccine (3 - Pfizer risk series) 02/14/2020    Results for orders placed or performed in visit on 02/08/24  POCT glycosylated hemoglobin (Hb A1C)  Result Value Ref Range   Hemoglobin A1C 8.4 (A) 4.0 - 5.6 %   HbA1c POC (<> result, manual entry)     HbA1c, POC (prediabetic range)     HbA1c, POC (controlled diabetic range)      The ASCVD Risk score (Arnett DK, et al., 2019) failed  to calculate for the following reasons:   Risk score cannot be calculated because patient has a medical history suggesting prior/existing ASCVD     Assessment & Plan:  1. Type 2 diabetes mellitus with hyperglycemia, with long-term current use of insulin (HCC) (Primary) - increase Tresiba to 13 units once daily , will titrate as necessary for elevated glucose levels.  - start ozempic 1mg  next week (she took 0.5mg  dose yesterday)  - continue glipizide 10mg  Bid  - POCT glycosylated hemoglobin (Hb A1C) - insulin degludec (TRESIBA) 100 UNIT/ML FlexTouch Pen; Inject 13 Units into the skin at bedtime. - Ambulatory referral to Podiatry - placed for diabetic foot care  2. Paroxysmal atrial fibrillation (HCC) - dofetilide (TIKOSYN) 250 MCG capsule; Take 1 capsule (250 mcg total) by mouth 2 (two) times daily.  Dispense: 60 capsule; Refill: 0 - continue follow up with cardiology as scheduled   3. Chronic respiratory failure with hypoxia (HCC) - continue 3L O2   - continue f/u with pulmonology as scheduled   4. Chronic heart failure, unspecified heart failure type (HCC) - stable   5. Gross hematuria - improving, continue with urology   6. Lower respiratory infection  -continue augmentin until complete  - OTC Coricidin HBP cough/congestion    Orders Placed This Encounter  Procedures   Ambulatory referral to Podiatry    Referral Priority:   Routine    Referral Type:   Consultation    Referral Reason:   Specialty Services  Required    Requested Specialty:   Podiatry    Number of Visits Requested:   1   POCT glycosylated hemoglobin (Hb A1C)   No images are attached to the encounter or orders placed in the encounter. Meds ordered this encounter  Medications   insulin degludec (TRESIBA) 100 UNIT/ML FlexTouch Pen    Sig: Inject 13 Units into the skin at bedtime.    Supervising Provider:   Garnette Gunner [[id number]]   dofetilide (TIKOSYN) 250 MCG capsule    Sig: Take 1 capsule (250 mcg total) by mouth 2 (two) times daily.    Dispense:  60 capsule    Refill:  0    Supervising Provider:   Garnette Gunner [[id number]]    Return in about 4 months (around 06/09/2024) for Chronic Condition follow up.   Salvatore Decent, FNP

## 2024-02-08 NOTE — Transitions of Care (Post Inpatient/ED Visit) (Signed)
   02/08/2024  Name: Tina Baker MRN: [id number] DOB: [birth year]  Today's TOC FU Call Status: Today's TOC FU Call Status:: Successful TOC FU Call Completed TOC FU Call Complete Date: 02/08/24 Patient's Name and Date of Birth confirmed.  Transition Care Management Follow-up Telephone Call Date of Discharge: 02/03/24 Discharge Facility: Other (Non-[facility] Facility) Type of Discharge: Emergency Department Reason for ED Visit: Other: How have you been since you were released from the hospital?: Same Any questions or concerns?: Yes Patient Questions/Concerns:: infection of lining of lungs  Items Reviewed: Did you receive and understand the discharge instructions provided?: Yes Medications obtained,verified, and reconciled?: Yes (Medications Reviewed) Any new allergies since your discharge?: No Dietary orders reviewed?: NA Do you have support at home?: Yes  Medications Reviewed Today: Medications Reviewed Today   Medications were not reviewed in this encounter     Home Care and Equipment/Supplies: Were Home Health Services Ordered?: No Any new equipment or medical supplies ordered?: No  Functional Questionnaire: Do you need assistance with bathing/showering or dressing?: Yes Do you need assistance with meal preparation?: Yes Do you need assistance with eating?: No Do you have difficulty maintaining continence: No Do you need assistance with getting out of bed/getting out of a chair/moving?: No Do you have difficulty managing or taking your medications?: No  Follow up appointments reviewed: PCP Follow-up appointment confirmed?: Yes Date of PCP follow-up appointment?: 02/08/24 Follow-up Provider: Salvatore Decent, FNP Specialist Hospital Follow-up appointment confirmed?: NA Do you need transportation to your follow-up appointment?: No Do you understand care options if your condition(s) worsen?: Yes-patient verbalized understanding    SIGNATURE Teniola Tseng D, CMA

## 2024-02-08 NOTE — Patient Instructions (Addendum)
 Continue Augmentin. Can use Coricidin HBP cough/congestion if needed     Diabetes:  Take 1mg  Ozempic starting next week  Increase Insulin to 13 units once daily. If still > 200 fasting, then call me.  Continue Glipizide 10mg  two times a day      Call urology to see if they need you to follow up for blood in urine

## 2024-02-23 ENCOUNTER — Ambulatory Visit: Admitting: Podiatry

## 2024-02-23 DIAGNOSIS — E1142 Type 2 diabetes mellitus with diabetic polyneuropathy: Secondary | ICD-10-CM | POA: Diagnosis not present

## 2024-02-23 DIAGNOSIS — M79674 Pain in right toe(s): Secondary | ICD-10-CM | POA: Diagnosis not present

## 2024-02-23 DIAGNOSIS — B351 Tinea unguium: Secondary | ICD-10-CM

## 2024-02-23 DIAGNOSIS — M79675 Pain in left toe(s): Secondary | ICD-10-CM

## 2024-02-23 DIAGNOSIS — R601 Generalized edema: Secondary | ICD-10-CM | POA: Diagnosis not present

## 2024-02-23 DIAGNOSIS — E11628 Type 2 diabetes mellitus with other skin complications: Secondary | ICD-10-CM

## 2024-02-23 NOTE — Progress Notes (Unsigned)
 Subjective:  Patient ID: Tina Baker, female    DOB: 02/15/1946,  MRN: [id number]  Tina Baker presents to clinic today for:  Chief Complaint  Patient presents with   Diabetes    NP here today for DM foot exam and nail care. He  nails have not been cut in a year, they are long and a little discolored. The 1st nails are thick. She also has some pain in the right foot going from the heel up to the ankle.  Last A1c  was 8, she had it done last week but not sure what the read was. She takes eliquis   Patient notes nails are thick, discolored, elongated and painful in shoegear when trying to ambulate.  Patient denies any burning or tingling in the feet at this time.  A female companion is with her today.  She expressed interest in the diabetic shoe program.  States her nails have not been trimmed in approximately 1 year.  She had been seeing another podiatrist but felt that he cut her hallux nails too short and she does not want any length trimmed off of them today.  PCP is Tina Kast, FNP.  Past Medical History:  Diagnosis Date   Arthritis    Atrial fibrillation (HCC)    Diabetes mellitus    Hyperlipidemia    Hypertension     Past Surgical History:  Procedure Laterality Date   ABDOMINAL HYSTERECTOMY  2001   BUNIONECTOMY Bilateral 1979   GALLBLADDER SURGERY     SPINE SURGERY  2010   protruding disc     Allergies  Allergen Reactions   Statins Other (See Comments)    myalgia   Valdecoxib Rash        Other Other (See Comments)   Zolpidem Other (See Comments)    Review of Systems: Negative except as noted in the HPI.  Objective:  Tina Baker is a pleasant 78 y.o. female in NAD. AAO x 3.  Vascular Examination: Capillary refill time is 3-5 seconds to toes bilateral. Palpable pedal pulses b/l LE. Digital hair present b/l.  Skin temperature gradient WNL b/l.  Moderate telangiectasias in both legs and ankles.  +1 pitting edema bilateral legs and  ankles  Dermatological Examination: Pedal skin with normal turgor, texture and tone b/l. No open wounds. No interdigital macerations b/l. Toenails x10 are 3mm thick, discolored, dystrophic with subungual debris. There is pain with compression of the nail plates.  They are elongated x10  Neurological Examination: Protective sensation intact bilateral LE.  Temperature sensation intact bilateral.  Vibratory sensation absent to the forefoot with tuning fork.      Latest Ref Rng & Units 02/08/2024    1:21 PM 07/02/2023    9:41 AM 03/31/2023    9:12 AM  Hemoglobin A1C  Hemoglobin-A1c 4.0 - 5.6 % 8.4  9.2  9.0    Assessment/Plan: 1. Pain due to onychomycosis of toenails of both feet   2. Type 2 diabetes mellitus with other skin complication, without long-term current use of insulin  (HCC)   3. Generalized edema   4. Diabetic polyneuropathy associated with type 2 diabetes mellitus (HCC)    The mycotic toenails were sharply debrided x10 with sterile nail nippers and a power debriding burr to decrease bulk/thickness and length.    I discussed the diabetic shoe program with the patient.  Will send a prescription to [facility] and they will need to contact the patient to get her scheduled  for diabetic shoe consult.  Recommend follow-up every 3 months   Julyan Gales D. Carlon Chaloux, DPM, FACFAS [facility]     [address] [place], [place] [address]                Office [phone]  Fax [phone]

## 2024-03-21 ENCOUNTER — Telehealth: Payer: Self-pay | Admitting: Internal Medicine

## 2024-03-21 ENCOUNTER — Other Ambulatory Visit: Payer: Self-pay

## 2024-03-21 DIAGNOSIS — E1165 Type 2 diabetes mellitus with hyperglycemia: Secondary | ICD-10-CM

## 2024-03-21 NOTE — Progress Notes (Signed)
   03/21/2024  Patient ID: Tina Baker, female   DOB: September 24, 1946, 78 y.o.   MRN: [id number]  Subjective/Objective Telephone visit to follow-up on management of T2DM  Diabetes Management Plan -Current medications: Ozempic 1mg  weekly on Fridays (has received 2 doses), Tresiba  13 units at bedtime, glipizide  10mg  bid before meal(s) -Patient endorses tolerating Ozempic 1mg  weekly well with no adverse side effects -Patient does monitor home FBG- normally mid 100s; as low as 127, as high as 189 -Last A1c was 9.2% August 2024 -Patient does not endorse s/sx of hypoglycemia  Assessment/Plan  Diabetes Management Plan -Recommend increasing Ozempic to 2mg  weekly once has completed 4 weeks of 1mg  dose -Continue Tresiba  13 units and glipizide  10mg  BID -Continue to monitor FBG daily and record -Patient with history of resistant UTI currently being followed by Urology-would not recommend SGLT2 therapy at this time -Completing Novo PAP order change form for Ozempic 2mg  weekly and faxing to PCP to sign and fax into Novo -Discussed starting CGM with patient. Will send prescription, and she will come into the office in 2 weeks to help with setup  Follow-up: 2 weeks for CGM set-up & review of BG to assess need for insulin  reduction prior to Ozempic dose increase  Abelina Abide, PharmD PGY1 Pharmacy Resident 03/21/2024 10:51 AM  Linn Rich, PharmD, DPLA

## 2024-03-21 NOTE — Telephone Encounter (Signed)
 Patient dropped off document Handicap Placard, to be filled out by provider. Patient requested to send it back via Call Patient to pick up within 7-days. Document is located in providers tray at front office.Please advise at Mobile [phone] (mobile)

## 2024-03-22 ENCOUNTER — Telehealth: Payer: Self-pay

## 2024-03-22 MED ORDER — FREESTYLE LIBRE 3 READER DEVI: 0 refills | Status: DC

## 2024-03-22 MED ORDER — FREESTYLE LIBRE 3 PLUS SENSOR MISC: 12 refills | Status: DC

## 2024-03-22 NOTE — Progress Notes (Signed)
 Ok great, I have gone ahead and order the [place] 3 reader and then the [place] 3+ sensors. I am filling out her paperwork today and will get that faxed over.  Thank you for all your help!

## 2024-03-22 NOTE — Telephone Encounter (Signed)
 Forms received placed in PCP folder

## 2024-03-22 NOTE — Progress Notes (Signed)
   03/22/2024  Patient ID: Tina Baker, female   DOB: 16-Jul-1946, 78 y.o.   MRN: [id number]  [facility], and Libre 3+ sensors and [place] 3 reader are going through on patient's insurance for $0 copay.  The reader has to be ordered and will be in tomorrow afternoon.    Provider is also working on provider portion of Novo PAP order change form for Ozempic 2mg  weekly and will send back to medication assistance team.  Sending patient a MyChart message about Jerrilyn Moras and reminding to bring to our follow-up visit at [place] on 6/3.  Linn Rich, PharmD, DPLA

## 2024-03-30 ENCOUNTER — Other Ambulatory Visit: Payer: Self-pay | Admitting: Family

## 2024-04-03 ENCOUNTER — Other Ambulatory Visit: Payer: Self-pay | Admitting: Internal Medicine

## 2024-04-03 ENCOUNTER — Encounter: Payer: Self-pay | Admitting: Internal Medicine

## 2024-04-03 DIAGNOSIS — I480 Paroxysmal atrial fibrillation: Secondary | ICD-10-CM

## 2024-04-03 DIAGNOSIS — F411 Generalized anxiety disorder: Secondary | ICD-10-CM

## 2024-04-04 ENCOUNTER — Encounter: Payer: Self-pay | Admitting: Internal Medicine

## 2024-04-04 ENCOUNTER — Other Ambulatory Visit: Payer: Self-pay

## 2024-04-04 MED ORDER — ALPRAZOLAM 0.5 MG PO TABS: ORAL_TABLET | ORAL | 1 refills | Status: DC

## 2024-04-04 NOTE — Progress Notes (Signed)
   04/04/2024  Patient ID: Pearlene Bouchard, female   DOB: 24-Feb-1946, 78 y.o.   MRN: [id number]  Subjective/Objective Patient presenting to [facility] for DM follow-up and assistance setting up [facility] 3+   Diabetes Management Plan -Current medications: Ozempic 1mg  weekly on Mondays, Tresiba  13 units at bedtime, glipizide  10mg  bid before meal(s) -Patient endorses she noticed some nausea and difficulty eating with most recent dose of Ozempic 1mg  weekly- this would have been her 3rd or 4th injection at this dose -Patient does monitor home FBG- normally mid 100s; as low as 127, as high as 189 -Last A1c was 9.2% August 2024 -Patient does not endorse s/sx of hypoglycemia -Approved to receive Ozempic 2mg  through Novo PAP, but this dose has not arrived yet   Assessment/Plan   Diabetes Management Plan -Set patient's Libre 3 reader up and adhered 3+ sensor to arm for CGM and educated on use -Continue current medication regimen at this time; advised patient she can increase to 2 injections of 1mg  pens weekly but only once no longer experiencing nausea with injection -Once Ozempic is increased to 2mg , decrease Tresiba  to 6 units daily (with goal to stop) -Continue glipizide  10mg  BID with meals at this time   Follow-up: 2 weeks   Linn Rich, PharmD, DPLA

## 2024-04-05 MED ORDER — LANSOPRAZOLE 30 MG PO CPDR: 30.0000 mg | DELAYED_RELEASE_CAPSULE | Freq: Every day | ORAL | 1 refills | Status: DC

## 2024-04-10 ENCOUNTER — Ambulatory Visit: Payer: Self-pay

## 2024-04-10 ENCOUNTER — Telehealth: Payer: Self-pay | Admitting: Internal Medicine

## 2024-04-10 ENCOUNTER — Telehealth: Payer: Self-pay

## 2024-04-10 NOTE — Telephone Encounter (Signed)
 Copied from CRM [id number]. Topic: General - Other >> Apr 10, 2024 12:19 PM Bearl Botts E wrote:  Reason for CRM: Pt is requesting a call back from [facility]. Has questions about her diabetes tracker on the back of her arm, it has come off.   Best contact: [phone]

## 2024-04-10 NOTE — Telephone Encounter (Addendum)
 This RN attempted to call patient to determine if she was symptomatic due to her glucose monitor falling off. No answer. LVM. Will route to office for follow up.      Copied from CRM [id number]. Topic: Clinical - Medical Advice >> Apr 10, 2024 12:06 PM Jim Motts C wrote: Reason for CRM: Patient called in and stated that her freestyle libre glucose monitoring keeps falling off her arm and she is not sure what to do about it. [phone].

## 2024-04-10 NOTE — Telephone Encounter (Signed)
 Returned call to patient and she contacted [facility] and was told to use another monitor and a replacement would be sent.

## 2024-04-10 NOTE — Progress Notes (Signed)
   04/10/2024  Patient ID: Tina Baker, female   DOB: 10-13-1946, 78 y.o.   MRN: [id number]  Patient outreach to return call regarding Libre 3+ sensor falling off.  Sensor was placed Tuesday and fell off Saturday, so I instructed patient to go ahead and use the other sensor she has at home.  I have placed a request online for her to receive a replacement from [employer].  We already had a telephone follow-up scheduled for Friday, so I will see if she has heard anything in regard to the replacement request then.  Linn Rich, PharmD, DPLA

## 2024-04-11 NOTE — Telephone Encounter (Signed)
 4 boxes of ozempic came in for pt for pick up.

## 2024-04-14 ENCOUNTER — Other Ambulatory Visit: Payer: Self-pay

## 2024-04-14 NOTE — Progress Notes (Signed)
   04/14/2024  Patient ID: Tina Baker, female   DOB: [birth year], 78 y.o.   MRN: [id number]  Subjective/Objective Telephone visit to follow-up on management of diabetes   Diabetes Management Plan -Current medications: Ozempic 1mg  weekly, Tresiba  13 units at bedtime, glipizide  10mg  bid before meal(s) -Patient took Ozempic 1mg  Wednesday (has been taking several weeks now-previously experiencing some nausea with dose) and endorses no nausea so far  -Patient does monitor home FBG- normally mid 100s; as low as 127, as high as 189 -Last A1c was 8.4% 4/8 -Patient does not endorse s/sx of hypoglycemia -Approved to receive Ozempic 2mg  through [facility] P[facility], but this dose has not arrived yet -Patient has not replaced Libre 3+ sensor yet, because she has not been able to get phone app to work correctly; and prefers this verus [place] 3 reader   Assessment/Plan   Diabetes Management Plan -Continue current regimen at this time -Once Ozempic is increased to 2mg , decrease Tresiba  to 6 units daily (with goal to stop) -Patient coming in with Libre 3+ sensor and phone Tuesday for me to assist with app set up   Follow-up: 6/17   Linn Rich, PharmD, DPLA

## 2024-04-18 ENCOUNTER — Other Ambulatory Visit: Payer: Self-pay

## 2024-04-18 NOTE — Progress Notes (Signed)
   04/18/2024  Patient ID: Tina Baker, female   DOB: 1946/02/01, 78 y.o.   MRN: [id number]  Patient presenting to [facility] for assistance setting up [place] 3 app on phone.  App set up completed and new sensor placed; I also advised patient on adhesive covers to help sensor stay in place.  Telephone follow-up visit scheduled for 7/15 to follow-up on management of diabetes and discuss increasing ozempic to 2mg .  Linn Rich, PharmD, DPLA

## 2024-04-25 ENCOUNTER — Other Ambulatory Visit: Payer: Self-pay | Admitting: Internal Medicine

## 2024-04-25 ENCOUNTER — Encounter: Payer: Self-pay | Admitting: Internal Medicine

## 2024-04-25 DIAGNOSIS — Z794 Long term (current) use of insulin: Secondary | ICD-10-CM

## 2024-04-28 ENCOUNTER — Telehealth: Payer: Self-pay

## 2024-04-28 NOTE — Progress Notes (Signed)
   04/28/2024  Patient ID: Tina Baker, female   DOB: Mar 04, 1946, 78 y.o.   MRN: [id number]  Returning missed call from patient regarding Libre 3+ sensor.  Second sensor has also not stayed in place for 15 days, but patient does have 3 additional sensors at home.  She also reports some lows of 59-64 three or four nights recently, but she does endorse sleeping on side where sensor was placed, which can lead to false low readings.  Advised to place new sensor on side she does not typically sleep on to see if this makes a difference.  Patient also plans to purchase Libre- specific adhesive covers to help these stay in place.  Follow-up already scheduled with patient in July.  Tina Baker, PharmD, DPLA

## 2024-04-30 ENCOUNTER — Other Ambulatory Visit: Payer: Self-pay | Admitting: Internal Medicine

## 2024-04-30 DIAGNOSIS — I480 Paroxysmal atrial fibrillation: Secondary | ICD-10-CM

## 2024-05-04 ENCOUNTER — Other Ambulatory Visit: Payer: Self-pay | Admitting: Internal Medicine

## 2024-05-09 ENCOUNTER — Telehealth: Payer: Self-pay

## 2024-05-09 NOTE — Telephone Encounter (Signed)
 4 boxes of Ozempic arrived for the pt. Placed in medication fridge.

## 2024-05-11 ENCOUNTER — Telehealth: Payer: Self-pay | Admitting: Internal Medicine

## 2024-05-11 NOTE — Telephone Encounter (Signed)
 Pt picked up her medication (ozempic) today at the office. 05/11/2024

## 2024-05-11 NOTE — Telephone Encounter (Signed)
 Pt had called to know if her medication Ozempic was ready at the office for pick up

## 2024-05-16 ENCOUNTER — Other Ambulatory Visit: Payer: Self-pay

## 2024-05-16 NOTE — Telephone Encounter (Signed)
 Error

## 2024-05-16 NOTE — Progress Notes (Signed)
   05/16/2024  Patient ID: [name], female   DOB: 03-31-1946, 78 y.o.   MRN: [id number]  Subjective/Objective Telephone visit to follow-up on management of diabetes   Diabetes Management Plan -Current medications: Ozempic 1mg  weekly, Tresiba  13 units at bedtime, glipizide  10mg  bid before meal(s) -Patient recently received Ozempic 2mg  from [facility] PAP and will take 1st injection of this dose today -Had cortisone knee injection yesterday, which has increased BG- FBG this morning was 172, but these had been 69-118 recenty -Last A1c was 8.4% 4/8 -Patient does not endorse s/sx of hypoglycemia -Using Libre 3+ for CGM and replaced sensor yesterday   Assessment/Plan   Diabetes Management Plan -Continue Tresiba  13 units at bedtime and glipizide  10mg  BID before meal(s) and start Ozempic 2mg  weekly today -Decrease Tresiba  to 10 units at bedtime if FBG <100 (likely once steroid begins to wear off)- goal to stop Tresiba  all together -Sees PCP again mid-August and will be due for A1c  Follow-up:  2 weeks  [name], PharmD, DPLA

## 2024-05-30 ENCOUNTER — Other Ambulatory Visit: Payer: Self-pay

## 2024-05-30 NOTE — Progress Notes (Signed)
   05/30/2024  Patient ID: [name], female   DOB: 1946/07/13, 78 y.o.   MRN: [id number]  Subjective/Objective Telephone visit to follow-up on management of diabetes   Diabetes Management Plan -Current medications: Ozempic 2mg  weekly, Tresiba  13 units at bedtime, glipizide  10mg  bid before meal(s) -Patient recently received Ozempic 2mg  from [facility] P[facility] and took 1st dose last Wednesday.  She does endorse feeling nauseas quite a bit on Friday after the injeciton. -Last A1c was 8.4% 4/8 -Patient does not endorse s/sx of hypoglycemia -Using Libre 3+ for CGM and most recent FBG's are 105, 143, 97, 119 -Patient also endorses decreased appetite and weight loss of approximately 20lbs since June 19th   Assessment/Plan   Diabetes Management Plan -Continue Tresiba  13 units at bedtime and glipizide  10mg  BID before meal(s) and start Ozempic 2mg  weekly today -Decrease Tresiba  to 10 units at bedtime if FBG <100, goal to stop Tresiba  all together -Encouraged good nutrition and adequate water intake with decreased appetite; get plenty of lean protein, fruits, vegetables -Patient will contact me if nausea occurs this week; if so, I can discuss possibility of PRN ondansetron  with PCP -Sees PCP again 8/11and will be due for A1c   Follow-up:  6 weeks   [name], PharmD, DPLA

## 2024-05-31 ENCOUNTER — Ambulatory Visit (INDEPENDENT_AMBULATORY_CARE_PROVIDER_SITE_OTHER): Admitting: Podiatry

## 2024-05-31 DIAGNOSIS — B351 Tinea unguium: Secondary | ICD-10-CM

## 2024-05-31 DIAGNOSIS — M79674 Pain in right toe(s): Secondary | ICD-10-CM

## 2024-05-31 DIAGNOSIS — M79675 Pain in left toe(s): Secondary | ICD-10-CM

## 2024-05-31 NOTE — Progress Notes (Signed)
       Subjective:  Patient ID: Tina Baker, female    DOB: 1946/05/19,  MRN: [id number]  Tina Baker presents to clinic today for:  Chief Complaint  Patient presents with   [facility]    [facility] with out callous. Last A1c is unknown, FBS today was 126, takes Eliquis.    Patient notes nails are thick, discolored, elongated and painful in shoegear when trying to ambulate.  She has been having some right thigh pain from her femur fracture.  She plans on speaking to her orthopedic surgeon soon to discuss this.  She is requesting a type of gel tube to wear on the third toe on the right foot to prevent irritation from the overlapping second toe  PCP is Billy Knee, FNP.  Past Medical History:  Diagnosis Date   Arthritis    Atrial fibrillation (HCC)    Diabetes mellitus    Hyperlipidemia    Hypertension     Past Surgical History:  Procedure Laterality Date   ABDOMINAL HYSTERECTOMY  2001   BUNIONECTOMY Bilateral 1979   GALLBLADDER SURGERY     SPINE SURGERY  2010   protruding disc     Allergies  Allergen Reactions   Statins Other (See Comments)    myalgia   Valdecoxib Rash        Other Other (See Comments)   Zolpidem Other (See Comments)    Review of Systems: Negative except as noted in the HPI.  Objective:  Tina Baker is a pleasant 78 y.o. female in NAD. AAO x 3.  Vascular Examination: Capillary refill time is 3-5 seconds to toes bilateral. Palpable pedal pulses b/l LE. Digital hair present b/l.  Skin temperature gradient WNL b/l.  Moderate telangiectasias in both legs and ankles.  +1 pitting edema bilateral legs and ankles  Dermatological Examination: Pedal skin with normal turgor, texture and tone b/l. No open wounds. No interdigital macerations b/l. Toenails x10 are 3mm thick, discolored, dystrophic with subungual debris. There is pain with compression of the nail plates.  They are elongated x10     Latest Ref Rng & Units 02/08/2024    1:21 PM 07/02/2023     9:41 AM  Hemoglobin A1C  Hemoglobin-A1c 4.0 - 5.6 % 8.4  9.2    Assessment/Plan: 1. Pain due to onychomycosis of toenails of both feet     The mycotic toenails were sharply debrided x10 with sterile nail nippers and a power debriding burr to decrease bulk/thickness and length.    A Silipos gel toe cap was dispensed to wear on the right third toe.  The tip was cut off to expose the distal pulp of the toe.  She was also dispensed a different type of gel tube with a pad on the side but she felt that the Silipos toe cap was more comfortable.  Recommend follow-up every 3 months   Kelsey Durflinger D. Lenus Trauger, DPM, FACFAS [facility]     [address] [place], [place] [address]                Office [phone]  Fax [phone]

## 2024-06-03 ENCOUNTER — Other Ambulatory Visit: Payer: Self-pay | Admitting: Internal Medicine

## 2024-06-03 DIAGNOSIS — I480 Paroxysmal atrial fibrillation: Secondary | ICD-10-CM

## 2024-06-05 NOTE — Telephone Encounter (Signed)
 Good afternoon. Is patient taking 2mg  for Ozempic or is it 1MG . Pt have 4 pens here in clinic (1MG ).

## 2024-06-12 ENCOUNTER — Ambulatory Visit (INDEPENDENT_AMBULATORY_CARE_PROVIDER_SITE_OTHER): Admitting: Internal Medicine

## 2024-06-12 ENCOUNTER — Encounter: Payer: Self-pay | Admitting: Internal Medicine

## 2024-06-12 VITALS — BP 120/68 | HR 65 | Temp 98.0°F | Ht 66.5 in | Wt 213.4 lb

## 2024-06-12 DIAGNOSIS — M150 Primary generalized (osteo)arthritis: Secondary | ICD-10-CM

## 2024-06-12 DIAGNOSIS — R300 Dysuria: Secondary | ICD-10-CM | POA: Diagnosis not present

## 2024-06-12 DIAGNOSIS — J9611 Chronic respiratory failure with hypoxia: Secondary | ICD-10-CM

## 2024-06-12 DIAGNOSIS — Z794 Long term (current) use of insulin: Secondary | ICD-10-CM

## 2024-06-12 DIAGNOSIS — Z8781 Personal history of (healed) traumatic fracture: Secondary | ICD-10-CM

## 2024-06-12 DIAGNOSIS — I480 Paroxysmal atrial fibrillation: Secondary | ICD-10-CM | POA: Diagnosis not present

## 2024-06-12 DIAGNOSIS — E1165 Type 2 diabetes mellitus with hyperglycemia: Secondary | ICD-10-CM | POA: Diagnosis not present

## 2024-06-12 DIAGNOSIS — Z9889 Other specified postprocedural states: Secondary | ICD-10-CM

## 2024-06-12 DIAGNOSIS — B351 Tinea unguium: Secondary | ICD-10-CM

## 2024-06-12 DIAGNOSIS — M79671 Pain in right foot: Secondary | ICD-10-CM

## 2024-06-12 DIAGNOSIS — E669 Obesity, unspecified: Secondary | ICD-10-CM

## 2024-06-12 LAB — POCT GLYCOSYLATED HEMOGLOBIN (HGB A1C): Hemoglobin A1C: 7 % — AB (ref 4.0–5.6)

## 2024-06-12 LAB — MICROALBUMIN / CREATININE URINE RATIO
Creatinine,U: 60.4 mg/dL
Microalb Creat Ratio: 44.8 mg/g — ABNORMAL HIGH (ref 0.0–30.0)
Microalb, Ur: 2.7 mg/dL — ABNORMAL HIGH (ref 0.0–1.9)

## 2024-06-12 NOTE — Progress Notes (Signed)
 [facility] [address] [place] [address] Dept: [phone] Dept Fax: [phone]    Subjective:   Tina Baker Nov 28, 1945 06/12/2024  Chief Complaint  Patient presents with   Follow-up    Sore on bottom of left foot     HPI: Tina Baker presents today for re-assessment and management of chronic medical conditions.  Discussed the use of AI scribe software for clinical note transcription with the patient, who gave verbal consent to proceed.  History of Present Illness   Tina Baker is a 78 year old female with a PMHx of type 2 diabetes, Afib, CHF, chronic respiratory failure,pulmonary hypertension, osteoarthritis who presents for a follow-up visit.  Her diabetes is currently monitored with the freestyle libre.  She is currently taking glipizide  10 mg twice daily, Tresiba  13 units daily, and Ozempic 2 mg once weekly.  Her Ozempic was recently increased approximately 1 month ago.  She does report some nausea recently with the increase to the 2 mg, but is tolerating well.  She reports her fasting blood sugars are in the 100-150.  She tries to watch her diet.  Denies symptoms of hypoglycemia such as dizziness, lightheadedness, diaphoresis, confusion.   She mentions a sore on the ball of her foot that started faintly and has worsened since her last visit to the foot doctor a week ago. The area causes pain.  No numbness in the foot is reported.  She has been experiencing pain in her hip, groin, and down her leg. She did suffer a femur fx within the past year , which required ORIF.  She visited the orthopedic doctor a couple of weeks ago, and x-rays were performed, but no specific cause for the pain was identified. She mentions difficulty sleeping in her bed due to pain, possibly from scar tissue, and sleeps in a lift chair instead. She is scheduled to have nerve block conducted by ortho for bilateral knee pain  caused by osteoarthritis.    She reports a burning sensation and odor in her urine over the past few days, raising concerns about a possible urinary tract infection.      Wt Readings from Last 3 Encounters:  06/12/24 213 lb 6.4 oz (96.8 kg)  02/08/24 227 lb 9.6 oz (103.2 kg)  07/27/23 226 lb (102.5 kg)   Lab Results  Component Value Date   HGBA1C 7.0 (A) 06/12/2024   HGBA1C 8.4 (A) 02/08/2024   HGBA1C 9.2 (A) 07/02/2023    The following portions of the patient's history were reviewed and updated as appropriate: past medical history, past surgical history, family history, social history, allergies, medications, and problem list.   Patient Active Problem List   Diagnosis Date Noted   Impaired functional mobility, balance, gait, and endurance 03/31/2023   Irritant contact dermatitis 03/31/2023   Arthralgia of right wrist 03/31/2023   Lumbar radiculopathy 02/22/2023   Acute idiopathic gout of right foot 02/02/2023   Chronic bilateral low back pain without sciatica 01/05/2023   Bilateral hip pain 01/05/2023   Impaired ambulation 01/05/2023   Weakness of both lower extremities 01/05/2023   Tinea unguium 12/01/2022   Heart failure (HCC) 04/09/2022   Acute on chronic heart failure with preserved ejection fraction (HFpEF) (HCC) 04/01/2022   Cryptogenic cirrhosis (HCC) 02/19/2022   Hypoglycemia 11/13/2021   Chronic respiratory failure with hypoxia (HCC) 11/12/2021   CAP (community acquired pneumonia) 11/12/2021   Subtherapeutic anticoagulation 11/12/2021   TIA (transient ischemic attack) 11/07/2021  Aspiration pneumonia (HCC) 09/17/2021   RSV (respiratory syncytial virus infection) 09/17/2021   Senile nuclear sclerosis 03/18/2021   Hypertrophy of bone, right ankle and foot 01/24/2021   Restrictive lung disease 05/17/2020   History of colon polyps 12/11/2019   Hyperbilirubinemia 12/11/2019   Thrombocytopenia (HCC) 12/11/2019   Pulmonary hypertension (HCC) 11/24/2019   Bilateral  pleural effusion 10/25/2019   Primary osteoarthritis of left hip 06/14/2019   Trochanteric bursitis of left hip 06/14/2019   Intolerance of continuous positive airway pressure (CPAP) ventilation 05/03/2019   Primary osteoarthritis of left knee 05/01/2019   Primary osteoarthritis of right knee 05/01/2019   Bilateral carpal tunnel syndrome 05/01/2019   Trigger thumb of left hand 05/01/2019   ANA positive 02/23/2019   Rheumatoid factor positive 02/23/2019   GI bleed 02/17/2019   Generalized anxiety disorder 11/27/2018   DNR (do not resuscitate) 11/27/2018   Elevated d-dimer 11/27/2018   Hyponatremia 11/27/2018   Major depressive disorder with current active episode 11/27/2018   Obesity (BMI 30-39.9) 11/27/2018   Essential hypertension 11/27/2018   Acute hypoxemic respiratory failure (HCC) 11/27/2018   Obstructive sleep apnea (adult) (pediatric) 08/12/2018   Acute pulmonary edema (HCC) 07/08/2018   Recurrent major depressive disorder, in partial remission (HCC) 04/13/2018   Varicose veins of left lower extremity with pain 04/13/2018   Constipation 02/19/2018   Long term (current) use of anticoagulants 12/31/2015   Acromioclavicular joint arthritis 12/31/2015   Subacromial bursitis 12/31/2015   Allergic rhinitis 11/13/2015   Benign essential hypertension 11/13/2015   Chronic tension-type headache, not intractable 11/13/2015   DDD (degenerative disc disease), lumbosacral 11/13/2015   Gastroesophageal reflux disease without esophagitis 11/13/2015   Paroxysmal atrial fibrillation (HCC) 11/13/2015   RLS (restless legs syndrome) 11/13/2015   Vitamin D deficiency 11/13/2015   Insomnia 11/13/2015   Class 1 obesity due to excess calories with serious comorbidity and body mass index (BMI) of 33.0 to 33.9 in adult 11/13/2015   Contracture of tendon sheath 11/13/2015   Spinal stenosis of lumbar region without neurogenic claudication 11/13/2015   Gout, arthritis 11/13/2015   Iron deficiency  anemia due to chronic blood loss 11/13/2015   Lumbar herniated disc 11/13/2015   Rotator cuff tendinitis 11/13/2015   Swelling 09/30/2015   Hospital discharge follow-up 07/04/2015   Leukocytosis 06/27/2015   Anemia, unspecified 06/26/2015   Type 2 diabetes mellitus with hyperglycemia, with long-term current use of insulin  (HCC) 06/25/2015   Venous insufficiency, peripheral 01/09/2015   Spondylolisthesis of lumbar region 11/03/2012   Past Medical History:  Diagnosis Date   Arthritis    Atrial fibrillation (HCC)    Diabetes mellitus    Hyperlipidemia    Hypertension    Past Surgical History:  Procedure Laterality Date   ABDOMINAL HYSTERECTOMY  2001   BUNIONECTOMY Bilateral 1979   GALLBLADDER SURGERY     SPINE SURGERY  2010   protruding disc    Family History  Problem Relation Age of Onset   Cancer Mother 87       leukemia    Heart disease Father 66       heart attack    Heart disease Sister     Current Outpatient Medications:    acetaminophen  (TYLENOL ) 325 MG tablet, Take by mouth., Disp: , Rfl:    albuterol  (VENTOLIN  HFA) 108 (90 Base) MCG/ACT inhaler, Inhale into the lungs., Disp: , Rfl:    ALPRAZolam  (XANAX ) 0.5 MG tablet, Take 1 - 2 tablets by mouth at bedtime as needed, Disp: 60 tablet, Rfl:  1   aspirin  81 MG tablet, Take 81 mg by mouth daily., Disp: , Rfl:    cholecalciferol (VITAMIN D3) 25 MCG (1000 UT) tablet, Take 2,000 Units by mouth daily., Disp: , Rfl:    Continuous Glucose Receiver (FREESTYLE LIBRE 3 READER) DEVI, Use for continuous glucose monitoring., Disp: 1 each, Rfl: 0   Continuous Glucose Sensor (FREESTYLE LIBRE 3 PLUS SENSOR) MISC, Change sensor every 15 days., Disp: 2 each, Rfl: 12   diclofenac Sodium (VOLTAREN) 1 % GEL, Apply topically., Disp: , Rfl:    diltiazem (CARDIZEM CD) 360 MG 24 hr capsule, , Disp: , Rfl:    dofetilide  (TIKOSYN ) 250 MCG capsule, Take 1 capsule by mouth twice daily, Disp: 60 capsule, Rfl: 0   ELIQUIS 5 MG TABS tablet, Take 5  mg by mouth 2 (two) times daily., Disp: , Rfl:    furosemide  (LASIX ) 40 MG tablet, Take 40 mg by mouth daily. Take an extra tab in the afternoon if you notice increasing leg swelling or your weight goes up by more than 2 pounds in a day or 5 pounds in a week., Disp: , Rfl:    glipiZIDE  (GLUCOTROL ) 10 MG tablet, TAKE 1 TABLET BY MOUTH TWICE DAILY BEFORE A MEAL, Disp: 180 tablet, Rfl: 0   glucose blood (RELION PRIME TEST) test strip, 1 each by Other route See admin instructions., Disp: , Rfl:    Insulin  Degludec FlexTouch 100 UNIT/ML SOPN, INJECT 18 UNITS SUBCUTANEOUSLY AT BEDTIME (Patient taking differently: Inject 13 Units into the skin daily.), Disp: 15 mL, Rfl: 0   Insulin  Pen Needle (GLOBAL EASY GLIDE PEN NEEDLES) 32G X 4 MM MISC, Use as directed to inject insulin  once daily., Disp: 200 each, Rfl: 3   lansoprazole  (PREVACID ) 30 MG capsule, Take 1 capsule (30 mg total) by mouth daily., Disp: 90 capsule, Rfl: 1   metoprolol succinate (TOPROL-XL) 50 MG 24 hr tablet, Take 1 tablet by mouth daily., Disp: , Rfl:    ondansetron  (ZOFRAN -ODT) 4 MG disintegrating tablet, Place 1 tablet under your tongue every 8 hours as needed, Disp: , Rfl: 0   OXYGEN , POC at  3 L via nasal cannula (stationary concentrator) with activity and 2 liters at night .   LON: 99 months See attached testing. DME provider:, Disp: , Rfl:    pramipexole (MIRAPEX) 0.5 MG tablet, Take 0.5 mg by mouth 3 (three) times daily., Disp: , Rfl:    sacubitril-valsartan (ENTRESTO) 24-26 MG, Take 1 tablet by mouth 2 (two) times daily., Disp: , Rfl:    Semaglutide, 2 MG/DOSE, (OZEMPIC, 2 MG/DOSE,) 8 MG/3ML SOPN, Inject 2 mg into the skin once a week., Disp: , Rfl:    traMADol (ULTRAM) 50 MG tablet, Take by mouth., Disp: , Rfl:  Allergies  Allergen Reactions   Statins Other (See Comments)    myalgia   Valdecoxib Rash        Other Other (See Comments)   Zolpidem Other (See Comments)     ROS: A complete ROS was performed with pertinent  positives/negatives noted in the HPI. The remainder of the ROS are negative.    Objective:   Today's Vitals   06/12/24 1309  BP: 120/68  Pulse: 65  Temp: 98 F (36.7 C)  TempSrc: Temporal  SpO2: 97%  Weight: 213 lb 6.4 oz (96.8 kg)  Height: 5' 6.5 (1.689 m)    GENERAL: Well-appearing, in NAD. Well nourished.  SKIN: Pink, warm and dry. No rash, lesion, ulceration, or ecchymoses. Strong 2+ DP  pulses, no lesions, skin breakdown, or ulcers observed on foot exam. Thickening and discoloration to right great toenail  NECK: Trachea midline. Full ROM w/o pain or tenderness. No lymphadenopathy.  RESPIRATORY: Chest wall symmetrical. Respirations even and non-labored. Breath sounds clear to auscultation bilaterally.  CARDIAC: S1, S2 present, regular rate and rhythm. Peripheral pulses 2+ bilaterally.  EXTREMITIES: Without clubbing, cyanosis. 1+ nonpitting edema BLE.  NEUROLOGIC:  Sensory intact.  PSYCH/MENTAL STATUS: Alert, oriented x 3. Cooperative, appropriate mood and affect.   Health Maintenance Due  Topic Date Due   COVID-19 Vaccine (3 - Pfizer risk series) 02/14/2020   Medicare Annual Wellness (AWV)  06/13/2024    The ASCVD Risk score (Arnett DK, et al., 2019) failed to calculate for the following reasons:   Risk score cannot be calculated because patient has a medical history suggesting prior/existing ASCVD     Assessment & Plan:  Assessment and Plan    Type 2 diabetes mellitus Improved glycemic control with A1c reduced to 7.0%. Congratulated patient.  -  Nausea due to increased Ozempic dose. - Monitor blood sugars closely, especially for readings below 100. - Notify provider if BG decreases below 100. Adjust insulin  to 10 units if this occurs.  - At this time, continue Ozempic 2mg  weekly, Glipizide  10mg  BID, and Tresiba  13 units.  - Order urine microalbumin test to check for proteinuria.  Onychomycosis  Onychomycosis present but not severe. Oral antifungal treatment not  recommended due to liver concerns.  Laser therapy not covered by insurance. - Continue regular toenail care with podiatrist. - She cannot do topical as she cannot reach down to her feet to apply daily. Her sister does help care for her.   Status Post open reduction internal fixation of fracture, Primary Osteoarthritis of multiple joints - Pain in hip, groin, and leg. No acute changes on recent x-rays. Pain may be exacerbated by arthritis, scar tissue. - Proceed with planned nerve blocks in both knees for pain management by ortho - Encourage careful ambulation and avoid overexertion.  Atrial fibrillation Atrial fibrillation well-controlled with regular rhythm. Pacemaker placement discussed with cardiologist. - Follow up with cardiologist regarding pacemaker decision, especially if AFib symptoms increase.  Chronic hypoxemia requiring supplemental oxygen  - Continue O2 3L use  - Continue routine follow up with pulmonology   Right Foot Pain  - No observed sore, open wound, lesion on foot - Continue follow up with podiatry.   Obesity Obesity with significant weight loss since April due to Ozempic and dietary management. - Continue current weight management strategies, including Ozempic and dietary monitoring.  Burning with urination Recent urinary symptoms suggest possible UTI. - Obtain urine sample for urinalysis and culture to evaluate for UTI.      Orders Placed This Encounter  Procedures   Microalbumin / creatinine urine ratio   Urinalysis w microscopic + reflex cultur   REFLEXIVE URINE CULTURE   POCT glycosylated hemoglobin (Hb A1C)   No images are attached to the encounter or orders placed in the encounter. No orders of the defined types were placed in this encounter.   Return in about 4 months (around 10/12/2024) for Chronic Condition follow up.   Rosina Senters, FNP

## 2024-06-12 NOTE — Telephone Encounter (Signed)
 2 1mg  pens of Ozempic taken from the shipment for this pt and given to another pt on the 1mg  dose per the instructions of Channing Mealing. 2 1mg  pens remaining in the fridge for this pt.

## 2024-06-13 ENCOUNTER — Telehealth: Payer: Self-pay

## 2024-06-13 LAB — URINALYSIS W MICROSCOPIC + REFLEX CULTURE
Bacteria, UA: NONE SEEN /HPF
Bilirubin Urine: NEGATIVE
Glucose, UA: NEGATIVE
Hgb urine dipstick: NEGATIVE
Hyaline Cast: NONE SEEN /LPF
Ketones, ur: NEGATIVE
Leukocyte Esterase: NEGATIVE
Nitrites, Initial: NEGATIVE
Protein, ur: NEGATIVE
RBC / HPF: NONE SEEN /HPF (ref 0–2)
Specific Gravity, Urine: 1.012 (ref 1.001–1.035)
WBC, UA: NONE SEEN /HPF (ref 0–5)
pH: 6.5 (ref 5.0–8.0)

## 2024-06-13 LAB — NO CULTURE INDICATED

## 2024-06-13 NOTE — Telephone Encounter (Signed)
 Forwarding message  Copied from CRM [id number]. Topic: General - Other >> Jun 13, 2024  2:55 PM Rosina BIRCH wrote: Reason for CRM: patient would like to be called by sheryl gentry the one who handles her glucose monitor  CB [phone]

## 2024-06-14 ENCOUNTER — Ambulatory Visit: Payer: Self-pay | Admitting: Internal Medicine

## 2024-06-14 ENCOUNTER — Telehealth: Payer: Self-pay

## 2024-06-14 NOTE — Progress Notes (Signed)
   06/14/2024  Patient ID: Tina Baker, female   DOB: 1946-10-11, 78 y.o.   MRN: [id number]  Returning missed call/voicemail from patient yesterday.  [name] recently put on a new Libre 3+ sensor and is getting false low readings; she states she was alerted yesterday that her BG was in the 60's, but verified with a fingerstick it was actually in the low 100's.  Patient got a refill of 2 sensors mid-July and has one remaining.  She will go ahead and replace this sensor to see if this helps with accuracy and will let me know if not.  New sensor should last 15 days, which will carry her until insurance will cover a refill.  Follow-up with patient is already scheduled for next month.  [name], PharmD, DPLA

## 2024-07-05 ENCOUNTER — Other Ambulatory Visit: Payer: Self-pay | Admitting: Internal Medicine

## 2024-07-05 DIAGNOSIS — F411 Generalized anxiety disorder: Secondary | ICD-10-CM

## 2024-07-05 NOTE — Telephone Encounter (Signed)
 Last Ov 06/12/24 Filled 04/04/24

## 2024-07-07 ENCOUNTER — Other Ambulatory Visit: Payer: Self-pay | Admitting: Internal Medicine

## 2024-07-07 ENCOUNTER — Ambulatory Visit (INDEPENDENT_AMBULATORY_CARE_PROVIDER_SITE_OTHER)

## 2024-07-07 DIAGNOSIS — I480 Paroxysmal atrial fibrillation: Secondary | ICD-10-CM

## 2024-07-07 DIAGNOSIS — Z0000 Encounter for general adult medical examination without abnormal findings: Secondary | ICD-10-CM | POA: Diagnosis not present

## 2024-07-07 NOTE — Patient Instructions (Signed)
 Ms. Searson,  Thank you for taking the time for your Medicare Wellness Visit. I appreciate your continued commitment to your health goals. Please review the care plan we discussed, and feel free to reach out if I can assist you further.  Medicare recommends these wellness visits once per year to help you and your care team stay ahead of potential health issues. These visits are designed to focus on prevention, allowing your provider to concentrate on managing your acute and chronic conditions during your regular appointments.  Please note that Annual Wellness Visits do not include a physical exam. Some assessments may be limited, especially if the visit was conducted virtually. If needed, we may recommend a separate in-person follow-up with your provider.  Ongoing Care Seeing your primary care provider every 3 to 6 months helps us  monitor your health and provide consistent, personalized care.   Referrals If a referral was made during today's visit and you haven't received any updates within two weeks, please contact the referred provider directly to check on the status.  Recommended Screenings:  Health Maintenance  Topic Date Due   COVID-19 Vaccine (3 - Pfizer risk series) 02/14/2020   Zoster (Shingles) Vaccine (1 of 2) 09/12/2024*   Flu Shot  12/13/2024*   Hepatitis C Screening  06/12/2025*   Mammogram  09/15/2024   Yearly kidney function blood test for diabetes  12/01/2024   Hemoglobin A1C  12/13/2024   Complete foot exam   02/07/2025   Eye exam for diabetics  03/20/2025   Yearly kidney health urinalysis for diabetes  06/12/2025   Medicare Annual Wellness Visit  07/07/2025   DTaP/Tdap/Td vaccine (3 - Td or Tdap) 06/29/2030   Pneumococcal Vaccine for age over 90  Completed   DEXA scan (bone density measurement)  Completed   HPV Vaccine  Aged Out   Meningitis B Vaccine  Aged Out   Hepatitis B Vaccine  Discontinued   Colon Cancer Screening  Discontinued  *Topic was postponed. The  date shown is not the original due date.       07/07/2024    4:27 PM  Advanced Directives  Does Patient Have a Medical Advance Directive? Yes  Type of Estate agent of [place];Living will  Copy of Healthcare Power of Attorney in Chart? No - copy requested   Advance Care Planning is important because it: Ensures you receive medical care that aligns with your values, goals, and preferences. Provides guidance to your family and loved ones, reducing the emotional burden of decision-making during critical moments.  Vision: Annual vision screenings are recommended for early detection of glaucoma, cataracts, and diabetic retinopathy. These exams can also reveal signs of chronic conditions such as diabetes and high blood pressure.  Dental: Annual dental screenings help detect early signs of oral cancer, gum disease, and other conditions linked to overall health, including heart disease and diabetes.  Please see the attached documents for additional preventive care recommendations.

## 2024-07-07 NOTE — Progress Notes (Signed)
 Subjective:   Tina Baker is a 78 y.o. who presents for a Medicare Wellness preventive visit.  As a reminder, Annual Wellness Visits don't include a physical exam, and some assessments may be limited, especially if this visit is performed virtually. We may recommend an in-person follow-up visit with your provider if needed.  Visit Complete: Virtual I connected with  Tina Baker on 07/07/24 by a audio enabled telemedicine application and verified that I am speaking with the correct person using two identifiers.  Patient Location: Home  Provider Location: Office/Clinic  I discussed the limitations of evaluation and management by telemedicine. The patient expressed understanding and agreed to proceed.  Vital Signs: Because this visit was a virtual/telehealth visit, some criteria may be missing or patient reported. Any vitals not documented were not able to be obtained and vitals that have been documented are patient reported.  VideoError- Librarian, academic were attempted between this provider and patient, however failed, due to patient having technical difficulties OR patient did not have access to video capability.  We continued and completed visit with audio only.   Persons Participating in Visit: Patient.  AWV Questionnaire: No: Patient Medicare AWV questionnaire was not completed prior to this visit.  Cardiac Risk Factors include: advanced age (>6men, >5 women);diabetes mellitus;hypertension     Objective:    Today's Vitals   There is no height or weight on file to calculate BMI.     07/07/2024    4:27 PM 06/14/2023    2:33 PM 03/07/2018    3:03 PM  Advanced Directives  Does Patient Have a Medical Advance Directive? Yes Yes No   Type of Estate agent of [place];Living will Healthcare Power of [place];Living will   Copy of Healthcare Power of Attorney in Chart? No - copy requested No - copy requested   Would  patient like information on creating a medical advance directive?   No - Patient declined      Data saved with a previous flowsheet row definition    Current Medications (verified) Outpatient Encounter Medications as of 07/07/2024  Medication Sig   acetaminophen  (TYLENOL ) 325 MG tablet Take by mouth.   ALPRAZolam  (XANAX ) 0.5 MG tablet TAKE 1 TO 2 TABLETS BY MOUTH AT BEDTIME AS NEEDED   cholecalciferol (VITAMIN D3) 25 MCG (1000 UT) tablet Take 2,000 Units by mouth daily.   diclofenac Sodium (VOLTAREN) 1 % GEL Apply topically.   diltiazem (CARDIZEM CD) 360 MG 24 hr capsule    dofetilide  (TIKOSYN ) 250 MCG capsule Take 1 capsule by mouth twice daily   ELIQUIS 5 MG TABS tablet Take 5 mg by mouth 2 (two) times daily.   furosemide  (LASIX ) 40 MG tablet Take 40 mg by mouth daily. Take an extra tab in the afternoon if you notice increasing leg swelling or your weight goes up by more than 2 pounds in a day or 5 pounds in a week.   glipiZIDE  (GLUCOTROL ) 10 MG tablet TAKE 1 TABLET BY MOUTH TWICE DAILY BEFORE A MEAL   glucose blood (RELION PRIME TEST) test strip 1 each by Other route See admin instructions.   Insulin  Degludec FlexTouch 100 UNIT/ML SOPN INJECT 18 UNITS SUBCUTANEOUSLY AT BEDTIME (Patient taking differently: Inject 13 Units into the skin daily.)   Insulin  Pen Needle (GLOBAL EASY GLIDE PEN NEEDLES) 32G X 4 MM MISC Use as directed to inject insulin  once daily.   lansoprazole  (PREVACID ) 30 MG capsule Take 1 capsule (30 mg total) by  mouth daily.   metoprolol succinate (TOPROL-XL) 50 MG 24 hr tablet Take 1 tablet by mouth daily.   ondansetron  (ZOFRAN -ODT) 4 MG disintegrating tablet Place 1 tablet under your tongue every 8 hours as needed   OXYGEN  POC at  3 L via nasal cannula (stationary concentrator) with activity and 2 liters at night .   LON: 99 months See attached testing. DME provider:   pramipexole (MIRAPEX) 0.5 MG tablet Take 0.5 mg by mouth 3 (three) times daily.   sacubitril-valsartan  (ENTRESTO) 24-26 MG Take 1 tablet by mouth 2 (two) times daily.   Semaglutide, 2 MG/DOSE, (OZEMPIC, 2 MG/DOSE,) 8 MG/3ML SOPN Inject 2 mg into the skin once a week.   albuterol  (VENTOLIN  HFA) 108 (90 Base) MCG/ACT inhaler Inhale into the lungs. (Patient not taking: Reported on 07/07/2024)   aspirin  81 MG tablet Take 81 mg by mouth daily. (Patient not taking: Reported on 07/07/2024)   Continuous Glucose Receiver (FREESTYLE LIBRE 3 READER) DEVI Use for continuous glucose monitoring. (Patient not taking: Reported on 07/07/2024)   Continuous Glucose Sensor (FREESTYLE LIBRE 3 PLUS SENSOR) MISC Change sensor every 15 days. (Patient not taking: Reported on 07/07/2024)   traMADol (ULTRAM) 50 MG tablet Take by mouth. (Patient not taking: Reported on 07/07/2024)   No facility-administered encounter medications on file as of 07/07/2024.    Allergies (verified) Statins, Valdecoxib, Other, and Zolpidem   History: Past Medical History:  Diagnosis Date   Arthritis    Atrial fibrillation (HCC)    Diabetes mellitus    Hyperlipidemia    Hypertension    Past Surgical History:  Procedure Laterality Date   ABDOMINAL HYSTERECTOMY  2001   BUNIONECTOMY Bilateral 1979   GALLBLADDER SURGERY     LEG SURGERY Right 09/24/2023   SPINE SURGERY  2010   protruding disc    Family History  Problem Relation Age of Onset   Cancer Mother 59       leukemia    Heart disease Father 88       heart attack    Heart disease Sister    Social History   Socioeconomic History   Marital status: Divorced    Spouse name: Not on file   Number of children: Not on file   Years of education: Not on file   Highest education level: 12th grade  Occupational History   Not on file  Tobacco Use   Smoking status: Never   Smokeless tobacco: Never  Vaping Use   Vaping status: Never Used  Substance and Sexual Activity   Alcohol use: No   Drug use: No   Sexual activity: Not on file  Other Topics Concern   Not on file  Social History  Narrative   Not on file   Social Drivers of Health   Financial Resource Strain: Low Risk  (07/07/2024)   Overall Financial Resource Strain (CARDIA)    Difficulty of Paying Living Expenses: Not hard at all  Food Insecurity: No Food Insecurity (07/07/2024)   Hunger Vital Sign    Worried About Running Out of Food in the Last Year: Never true    Ran Out of Food in the Last Year: Never true  Transportation Needs: No Transportation Needs (07/07/2024)   PRAPARE - Administrator, Civil Service (Medical): No    Lack of Transportation (Non-Medical): No  Physical Activity: Inactive (07/07/2024)   Exercise Vital Sign    Days of Exercise per Week: 0 days    Minutes of Exercise  per Session: 0 min  Stress: No Stress Concern Present (07/07/2024)   [employer] of Occupational Health - Occupational Stress Questionnaire    Feeling of Stress: Not at all  Social Connections: Moderately Isolated (07/07/2024)   Social Connection and Isolation Panel    Frequency of Communication with Friends and Family: More than three times a week    Frequency of Social Gatherings with Friends and Family: More than three times a week    Attends Religious Services: More than 4 times per year    Active Member of [employer] or Organizations: No    Attends Engineer, structural: Never    Marital Status: Divorced    Tobacco Counseling Counseling given: Not Answered    Clinical Intake:  Pre-visit preparation completed: Yes  Pain : No/denies pain     Nutritional Risks: Nausea/ vomitting/ diarrhea (slight nausea probably ozempic) Diabetes: Yes CBG done?: No Did pt. bring in CBG monitor from home?: No  Lab Results  Component Value Date   HGBA1C 7.0 (A) 06/12/2024   HGBA1C 8.4 (A) 02/08/2024   HGBA1C 9.2 (A) 07/02/2023     How often do you need to have someone help you when you read instructions, pamphlets, or other written materials from your doctor or pharmacy?: 1 - Never  Interpreter Needed?:  No  Information entered by :: NAllen LPN   Activities of Daily Living     07/07/2024    4:18 PM  In your present state of health, do you have any difficulty performing the following activities:  Hearing? 0  Vision? 1  Comment a little bit  Difficulty concentrating or making decisions? 0  Walking or climbing stairs? 1  Dressing or bathing? 0  Doing errands, shopping? 0  Preparing Food and eating ? N  Using the Toilet? N  In the past six months, have you accidently leaked urine? Y  Do you have problems with loss of bowel control? N  Managing your Medications? N  Managing your Finances? N  Housekeeping or managing your Housekeeping? N    Patient Care Team: Billy Knee, FNP as PCP - General (Internal Medicine) Heide Ingle, MD as Consulting Physician (Orthopedic Surgery) Deanna Channing LABOR, [facility] (Pharmacist)  I have updated your Care Teams any recent Medical Services you may have received from other providers in the past year.     Assessment:   This is a routine wellness examination for [place].  Hearing/Vision screen Hearing Screening - Comments:: Denies hearing issues Vision Screening - Comments:: Regular eye exams, [facility]   Goals Addressed             This Visit's Progress    Patient Stated       07/07/2024, wants to walk without walker       Depression Screen     07/07/2024    4:31 PM 06/12/2024    1:06 PM 02/08/2024    1:09 PM 12/09/2023    1:27 PM 11/01/2023    2:35 PM 07/27/2023    2:42 PM 07/02/2023    9:13 AM  PHQ 2/9 Scores  PHQ - 2 Score 0 0 0 0 2 0 0  PHQ- 9 Score 2          Fall Risk     07/07/2024    4:29 PM 06/12/2024    1:06 PM 02/08/2024    1:09 PM 01/19/2024   10:48 AM 12/09/2023    1:27 PM  Fall Risk   Falls in the past year?  1 0 0 0 0  Comment step down off step ladder      Number falls in past yr: 0 0 0 0 0  Injury with Fall? 1 0 0 0 0  Comment broke femur      Risk for fall due to : Impaired mobility;Impaired balance/gait;Medication  side effect No Fall Risks No Fall Risks No Fall Risks No Fall Risks  Follow up Falls prevention discussed;Falls evaluation completed Falls prevention discussed Falls prevention discussed Falls prevention discussed Falls evaluation completed    MEDICARE RISK AT HOME:  Medicare Risk at Home Any stairs in or around the home?: No If so, are there any without handrails?: No Home free of loose throw rugs in walkways, pet beds, electrical cords, etc?: Yes Adequate lighting in your home to reduce risk of falls?: Yes Life alert?: No Use of a cane, walker or w/c?: Yes Grab bars in the bathroom?: No Shower chair or bench in shower?: Yes Elevated toilet seat or a handicapped toilet?: Yes  TIMED UP AND GO:  Was the test performed?  No  Cognitive Function: 6CIT completed        07/07/2024    4:32 PM 06/14/2023    2:35 PM  6CIT Screen  What Year? 0 points 0 points  What month? 0 points 0 points  What time? 0 points 0 points  Count back from 20 0 points 0 points  Months in reverse 0 points 0 points  Repeat phrase 0 points 0 points  Total Score 0 points 0 points    Immunizations Immunization History  Administered Date(s) Administered   INFLUENZA, HIGH DOSE SEASONAL PF 09/29/2017, 11/27/2019, 07/23/2020, 08/07/2021, 08/26/2022   Influenza Split 07/31/2015   Influenza,inj,Quad PF,6+ Mos 08/10/2016   Influenza-Unspecified 07/09/2015   PFIZER(Purple Top)SARS-COV-2 Vaccination 12/27/2019, 01/17/2020   Pneumococcal Conjugate PCV 7 07/20/2014   Pneumococcal Conjugate-13 03/22/2014   Pneumococcal Polysaccharide-23 08/10/2016   Td (Adult),5 Lf Tetanus Toxid, Preservative Free 08/30/2003   Tdap 08/10/2016, 06/29/2020   Zoster, Live 10/21/2011    Screening Tests Health Maintenance  Topic Date Due   COVID-19 Vaccine (3 - Pfizer risk series) 02/14/2020   Zoster Vaccines- Shingrix (1 of 2) 09/12/2024 (Originally 11/05/1964)   Influenza Vaccine  12/13/2024 (Originally 06/02/2024)   Hepatitis C  Screening  06/12/2025 (Originally 11[birth year]05/1963)   MAMMOGRAM  09/15/2024   Diabetic kidney evaluation - eGFR measurement  12/01/2024   HEMOGLOBIN A1C  12/13/2024   FOOT EXAM  02/07/2025   OPHTHALMOLOGY EXAM  03/20/2025   Diabetic kidney evaluation - Urine ACR  06/12/2025   Medicare Annual Wellness (AWV)  07/07/2025   DTaP/Tdap/Td (3 - Td or Tdap) 06/29/2030   Pneumococcal Vaccine: 50+ Years  Completed   DEXA SCAN  Completed   HPV VACCINES  Aged Out   Meningococcal B Vaccine  Aged Out   Hepatitis B Vaccines 19-59 Average Risk  Discontinued   Colonoscopy  Discontinued    Health Maintenance Items Addressed: Declines covid vaccine.  Additional Screening:  Vision Screening: Recommended annual ophthalmology exams for early detection of glaucoma and other disorders of the eye. Is the patient up to date with their annual eye exam?  No  Who is the provider or what is the name of the office in which the patient attends annual eye exams? Dr. Alec  Dental Screening: Recommended annual dental exams for proper oral hygiene  Community Resource Referral / Chronic Care Management: CRR required this visit?  No   CCM required this visit?  No  Plan:    I have personally reviewed and noted the following in the patient's chart:   Medical and social history Use of alcohol, tobacco or illicit drugs  Current medications and supplements including opioid prescriptions. Patient is not currently taking opioid prescriptions. Functional ability and status Nutritional status Physical activity Advanced directives List of other physicians Hospitalizations, surgeries, and ER visits in previous 12 months Vitals Screenings to include cognitive, depression, and falls Referrals and appointments  In addition, I have reviewed and discussed with patient certain preventive protocols, quality metrics, and best practice recommendations. A written personalized care plan for preventive services as well as general  preventive health recommendations were provided to patient.   Ardella FORBES Dawn, LPN   0/01/[id number]   After Visit Summary: (MyChart) Due to this being a telephonic visit, the after visit summary with patients personalized plan was offered to patient via MyChart   Notes: Nothing significant to report at this time.

## 2024-07-17 NOTE — Progress Notes (Unsigned)
   07/18/2024  Patient ID: [name], female   DOB: 03/19/1946, 78 y.o.   MRN: [id number]  Subjective/Objective Telephone visit to follow-up on management of diabetes   Diabetes Management Plan -Current medications: Ozempic 2mg  weekly, Tresiba  13 units at bedtime, glipizide  10mg  bid before meal(s) -Patient recently received Ozempic 2mg  from [facility] -she is currently using 1mg  pen on hand giving 2 injections of 1mg  weekly to total 2mg .  Has 2 boxes of Ozempic 2mg  on hand she will use once current 1mg  pen is gone. -Continues to experience nausea with Ozempic and would like a refill on ondansetron  (what she had at home was from 2021) -Last A1c down to 7% on 8/11-was 8.4% 4/8 -Patient does not endorse s/sx of hypoglycemia -Was using Libre 3+ sensor for CGM but last sensor fell off, and she has not yet replaced.  Using fingersticks to check FBG daily right now and these values are averaging 100-130. -Patient also endorses decreased appetite and weight loss of approximately 20lbs since June 19th -Having knee procedure Friday and will skip Ozempic dose this Wednesday, will also hold glipizide  morning of procedure and only take 50% of Tresiba  dose Wed and Thurs -Patient also needs Tresiba  refills sent to the pharmacy -Continues to endorse decreased appetite but states she is getting plenty of protein, fruits, water, and some vegetables   Assessment/Plan   Diabetes Management Plan -Continue current regimen -Order pending for Tresiba  refill and ondansetron  for PCP to sign if in agreement -Patient plans to place new Libre 3+ sensor and use adhesive cover to help keep in place -Sees PCP again 12/11 and will be due for follow-up A1c   Follow-up:  6 weeks   [name], PharmD, DPLA

## 2024-07-18 ENCOUNTER — Other Ambulatory Visit: Payer: Self-pay | Admitting: Internal Medicine

## 2024-07-18 ENCOUNTER — Other Ambulatory Visit: Payer: Self-pay

## 2024-07-18 DIAGNOSIS — E1165 Type 2 diabetes mellitus with hyperglycemia: Secondary | ICD-10-CM

## 2024-07-18 MED ORDER — INSULIN DEGLUDEC FLEXTOUCH 100 UNIT/ML ~~LOC~~ SOPN
13.0000 [IU] | PEN_INJECTOR | Freq: Every day | SUBCUTANEOUS | 1 refills | Status: AC
Start: 2024-07-18 — End: ?

## 2024-07-18 MED ORDER — ONDANSETRON 4 MG PO TBDP: 4.0000 mg | ORAL_TABLET | Freq: Three times a day (TID) | ORAL | 0 refills | Status: AC | PRN

## 2024-07-18 NOTE — Progress Notes (Signed)
 Both Rx's signed and sent. Thanks!

## 2024-07-31 ENCOUNTER — Other Ambulatory Visit: Payer: Self-pay | Admitting: Internal Medicine

## 2024-08-01 NOTE — Telephone Encounter (Signed)
 Ozempic for pt arrived to office for pick up. 4 boxes 8mg /77ml.

## 2024-08-02 ENCOUNTER — Ambulatory Visit: Admitting: Nurse Practitioner

## 2024-08-04 ENCOUNTER — Other Ambulatory Visit: Payer: Self-pay | Admitting: Internal Medicine

## 2024-08-04 DIAGNOSIS — I480 Paroxysmal atrial fibrillation: Secondary | ICD-10-CM

## 2024-08-07 NOTE — Telephone Encounter (Signed)
This needs to be filled by her cardiologist

## 2024-08-21 ENCOUNTER — Other Ambulatory Visit: Payer: Self-pay | Admitting: Internal Medicine

## 2024-08-21 DIAGNOSIS — E1165 Type 2 diabetes mellitus with hyperglycemia: Secondary | ICD-10-CM

## 2024-08-30 ENCOUNTER — Ambulatory Visit: Admitting: Podiatry

## 2024-08-30 DIAGNOSIS — B351 Tinea unguium: Secondary | ICD-10-CM

## 2024-08-30 DIAGNOSIS — E1142 Type 2 diabetes mellitus with diabetic polyneuropathy: Secondary | ICD-10-CM | POA: Diagnosis not present

## 2024-08-30 DIAGNOSIS — M79675 Pain in left toe(s): Secondary | ICD-10-CM

## 2024-08-30 DIAGNOSIS — M79674 Pain in right toe(s): Secondary | ICD-10-CM | POA: Diagnosis not present

## 2024-08-30 NOTE — Progress Notes (Signed)
       Subjective:  Patient ID: Tina Baker, female    DOB: Nov 26, 1945,  MRN: [id number]  Tina Baker presents to clinic today for:  Chief Complaint  Patient presents with   [facility]    [facility] with out callous A1c 7.0 in Aug [place]   Patient notes nails are thick, discolored, elongated and painful in shoegear when trying to ambulate.   PCP is Billy Knee, FNP.  Past Medical History:  Diagnosis Date   Arthritis    Atrial fibrillation (HCC)    Diabetes mellitus    Hyperlipidemia    Hypertension     Past Surgical History:  Procedure Laterality Date   ABDOMINAL HYSTERECTOMY  2001   BUNIONECTOMY Bilateral 1979   GALLBLADDER SURGERY     LEG SURGERY Right 09/24/2023   SPINE SURGERY  2010   protruding disc     Allergies  Allergen Reactions   Statins Other (See Comments)    myalgia   Valdecoxib Rash        Other Other (See Comments)   Zolpidem Other (See Comments)    Review of Systems: Negative except as noted in the HPI.  Objective:  Tina Baker is a pleasant 78 y.o. female in NAD. AAO x 3.  Vascular Examination: Capillary refill time is 3-5 seconds to toes bilateral. Palpable pedal pulses b/l LE. Digital hair present b/l.  Skin temperature gradient WNL b/l.  Moderate telangiectasias in both legs and ankles.  +1 pitting edema bilateral legs and ankles  Dermatological Examination: Pedal skin with normal turgor, texture and tone b/l. No open wounds. No interdigital macerations b/l. Toenails x10 are 3mm thick, discolored, dystrophic with subungual debris. There is pain with compression of the nail plates.  They are elongated x10     Latest Ref Rng & Units 06/12/2024    2:14 PM 02/08/2024    1:21 PM  Hemoglobin A1C  Hemoglobin-A1c 4.0 - 5.6 % 7.0  8.4    Assessment/Plan: 1. Pain due to onychomycosis of toenails of both feet   2. Diabetic polyneuropathy associated with type 2 diabetes mellitus (HCC)    The mycotic toenails were sharply debrided x10 with  sterile nail nippers and a power debriding burr to decrease bulk/thickness and length.    Recommend follow-up every 3 months   Tina Baker D. Salinda Snedeker, DPM, FACFAS [facility]     [address] [place], [place] [address]                Office [phone]  Fax [phone]

## 2024-09-04 NOTE — Progress Notes (Unsigned)
   07/18/2024  Patient ID: Tina Baker, female   DOB: 03/19/1946, 78 y.o.   MRN: [id number]  Subjective/Objective Telephone visit to follow-up on management of diabetes   Diabetes Management Plan -Current medications: Ozempic 2mg  weekly, Tresiba  13 units at bedtime, glipizide  10mg  bid before meal(s) -Patient recently received Ozempic 2mg  from [facility] -she is currently using 1mg  pen on hand giving 2 injections of 1mg  weekly to total 2mg .  Has 2 boxes of Ozempic 2mg  on hand she will use once current 1mg  pen is gone. -Continues to experience nausea with Ozempic and would like a refill on ondansetron  (what she had at home was from 2021) -Last A1c down to 7% on 8/11-was 8.4% 4/8 -Patient does not endorse s/sx of hypoglycemia -Was using Libre 3+ sensor for CGM but last sensor fell off, and she has not yet replaced.  Using fingersticks to check FBG daily right now and these values are averaging 100-130. -Patient also endorses decreased appetite and weight loss of approximately 20lbs since June 19th -Having knee procedure Friday and will skip Ozempic dose this Wednesday, will also hold glipizide  morning of procedure and only take 50% of Tresiba  dose Wed and Thurs -Patient also needs Tresiba  refills sent to the pharmacy -Continues to endorse decreased appetite but states she is getting plenty of protein, fruits, water, and some vegetables   Assessment/Plan   Diabetes Management Plan -Continue current regimen -Order pending for Tresiba  refill and ondansetron  for PCP to sign if in agreement -Patient plans to place new Libre 3+ sensor and use adhesive cover to help keep in place -Sees PCP again 12/11 and will be due for follow-up A1c   Follow-up:  6 weeks   Tina Baker, PharmD, DPLA

## 2024-09-04 NOTE — Telephone Encounter (Signed)
 Pt has 4 boxes of 8mg  Ozempic here at the office to be picked up. Medication given to pt.

## 2024-09-05 ENCOUNTER — Other Ambulatory Visit: Payer: Self-pay | Admitting: Internal Medicine

## 2024-09-05 ENCOUNTER — Other Ambulatory Visit: Payer: Self-pay

## 2024-09-05 DIAGNOSIS — I10 Essential (primary) hypertension: Secondary | ICD-10-CM

## 2024-09-05 DIAGNOSIS — Z794 Long term (current) use of insulin: Secondary | ICD-10-CM

## 2024-09-05 DIAGNOSIS — I480 Paroxysmal atrial fibrillation: Secondary | ICD-10-CM

## 2024-09-22 ENCOUNTER — Other Ambulatory Visit: Payer: Self-pay | Admitting: Internal Medicine

## 2024-10-05 ENCOUNTER — Telehealth: Payer: Self-pay

## 2024-10-05 DIAGNOSIS — E1165 Type 2 diabetes mellitus with hyperglycemia: Secondary | ICD-10-CM

## 2024-10-05 NOTE — Progress Notes (Signed)
   10/05/24  Patient ID: [name], female   DOB: [birth year], 78 y.o.   MRN: [id number]  Subjective/Objective Returning missed call/voicemail from patient in regard to issues with Herlene 3   Diabetes Management Plan -Current medications: Ozempic 2mg  weekly, Tresiba  13 units at bedtime, glipizide  10mg  bid before meal(s) -Patient receives Ozempic through Novo PAP currently but will not after January 2026.  Insurance plan should cover this medication at no cost moving forward, though. -Tolerating Ozempic 2mg  weekly well now, and has not needed PRN ondansetron  in the last few weeks -Patient does not endorse s/sx of hypoglycemia but states Herlene will go off almost every night saying BG is in the 60's, which she does not believe is accurate -Herlene also continues to lose signal even when she is right beside the reader, and the last sensor she placed caused a decent amount of bleeding -Patient prefers to go back to checking BG with finger sticks at this time -FBG is averaging 87-120 -ACEi/ARB for cardiorenal protection:  entresto 24/26mg  BID; BP 120/68 at 8/11 OV -UACR 44.8 on 8/11- Jardiance and Farxiga prescribed in the past, but medications were not affordable -Statin for ASCVD risk reduction:  no, statins caused myalgias in the past; last lipid panel was in 2024  Lab Results  Component Value Date   HGBA1C 7.0 (A) 06/12/2024   HGBA1C 8.4 (A) 02/08/2024   HGBA1C 9.2 (A) 07/02/2023      Component Value Date/Time   CHOL 198 03/31/2023 1018   TRIG 130.0 03/31/2023 1018   HDL 57.30 03/31/2023 1018   CHOLHDL 3 03/31/2023 1018   VLDL 26.0 03/31/2023 1018   LDLCALC 115 (H) 03/31/2023 1018   Assessment/Plan   Diabetes Management Plan -Continue current regimen at this time -Patient will stop using Libre 3 and resume checking BG regularly with fingersticks -I did advise her to hang out to the sensors she has remaining and the [place] reader in case she would like to go back to these in the  future -Upcoming appointment with cardiology to see if pacemaker needs to be placed- use of CGM in patients with a pacemaker is not recommended per [employer] (maker of [place])  -Sees PCP again 12/11 and will be due for follow-up A1c; I also recommend CMP, lipid panel, and UACR -Patient is enrolling in [employer] diabetes [employer] heart plan for 2026, which will cover diabetes and cardiovascular medications at no cost.  This means continuation of Ozempic in setting of Novo PAP changes should not be an issue.  She should also be able to fill Farxiga or Jardiance at no cost; I would recommend consideration of one of these medications if no PMH of frequent genitourinary infections (patient currently has a UTI). -If LDL >70, I recommend dietary changes along with consideration of ezetimibe 10mg  daily to help lower this and ASCVD risk   Follow-up:  1/13   [name], PharmD, DPLA

## 2024-10-07 ENCOUNTER — Other Ambulatory Visit: Payer: Self-pay | Admitting: Internal Medicine

## 2024-10-07 DIAGNOSIS — I480 Paroxysmal atrial fibrillation: Secondary | ICD-10-CM

## 2024-10-10 NOTE — Telephone Encounter (Signed)
 Refill request received for Dofetilide   FOV: 10/12/2024 LOV:06/12/2024 Last refill:09/05/2024 Medication is pending your approval.

## 2024-10-12 ENCOUNTER — Ambulatory Visit: Admitting: Internal Medicine

## 2024-10-24 ENCOUNTER — Other Ambulatory Visit: Payer: Self-pay | Admitting: Internal Medicine

## 2024-10-24 NOTE — Telephone Encounter (Signed)
 Pt requesting refill for glipiZIDE  (GLUCOTROL ) 10 MG tablet   LOV 06/12/24 FOV  11/22/2024 LRF 07/31/24

## 2024-11-03 ENCOUNTER — Other Ambulatory Visit: Payer: Self-pay | Admitting: Internal Medicine

## 2024-11-03 DIAGNOSIS — F411 Generalized anxiety disorder: Secondary | ICD-10-CM

## 2024-11-06 DIAGNOSIS — I480 Paroxysmal atrial fibrillation: Secondary | ICD-10-CM

## 2024-11-06 NOTE — Telephone Encounter (Signed)
 Requested Prescriptions   Pending Prescriptions Disp Refills   ALPRAZolam  (XANAX ) 0.5 MG tablet [Pharmacy Med Name: ALPRAZolam  0.5 MG Oral Tablet] 60 tablet 0    Sig: TAKE 1 TO 2 TABLETS BY MOUTH AT BEDTIME AS NEEDED     Please review refill request.

## 2024-11-06 NOTE — Telephone Encounter (Signed)
 No, this needs to be filled by the patient's cardiologist.

## 2024-11-13 NOTE — Progress Notes (Unsigned)
" ° °  10/05/24  Patient ID: Tina Baker, female   DOB: December 13, 1945, 79 y.o.   MRN: [id number]  Subjective/Objective Returning missed call/voicemail from patient in regard to issues with Tina Baker 3   Diabetes Management Plan -Current medications: Ozempic  2mg  weekly, Tresiba  13 units at bedtime, glipizide  10mg  bid before meal(s) -Patient receives Ozempic  through Novo PAP currently but will not after January 2026.  Insurance plan should cover this medication at no cost moving forward, though. -Tolerating Ozempic  2mg  weekly well now, and has not needed PRN ondansetron  in the last few weeks -Patient does not endorse s/sx of hypoglycemia but states Tina Baker will go off almost every night saying BG is in the 60's, which she does not believe is accurate -Tina Baker also continues to lose signal even when she is right beside the reader, and the last sensor she placed caused a decent amount of bleeding -Patient prefers to go back to checking BG with finger sticks at this time -FBG is averaging 87-120 -ACEi/ARB for cardiorenal protection:  entresto 24/26mg  BID; BP 120/68 at 8/11 OV -UACR 44.8 on 8/11- Jardiance and Farxiga prescribed in the past, but medications were not affordable -Statin for ASCVD risk reduction:  no, statins caused myalgias in the past; last lipid panel was in 2024  Lab Results  Component Value Date   HGBA1C 7.0 (A) 06/12/2024   HGBA1C 8.4 (A) 02/08/2024   HGBA1C 9.2 (A) 07/02/2023      Component Value Date/Time   CHOL 198 03/31/2023 1018   TRIG 130.0 03/31/2023 1018   HDL 57.30 03/31/2023 1018   CHOLHDL 3 03/31/2023 1018   VLDL 26.0 03/31/2023 1018   LDLCALC 115 (H) 03/31/2023 1018   Assessment/Plan   Diabetes Management Plan -Continue current regimen at this time -Patient will stop using Libre 3 and resume checking BG regularly with fingersticks -I did advise her to hang out to the sensors she has remaining and the [place] reader in case she would like to go back to these in the  future -Upcoming appointment with cardiology to see if pacemaker needs to be placed- use of CGM in patients with a pacemaker is not recommended per [employer] (maker of [place])  -Sees PCP again 12/11 and will be due for follow-up A1c; I also recommend CMP, lipid panel, and UACR -Patient is enrolling in [employer] diabetes and heart plan for 2026, which will cover diabetes and cardiovascular medications at no cost.  This means continuation of Ozempic  in setting of Novo PAP changes should not be an issue.  She should also be able to fill Farxiga or Jardiance at no cost; I would recommend consideration of one of these medications if no PMH of frequent genitourinary infections (patient currently has a UTI). -If LDL >70, I recommend dietary changes along with consideration of ezetimibe 10mg  daily to help lower this and ASCVD risk   Follow-up:  1/13   [name], PharmD, DPLA  "

## 2024-11-14 ENCOUNTER — Other Ambulatory Visit: Payer: Self-pay

## 2024-11-14 DIAGNOSIS — I10 Essential (primary) hypertension: Secondary | ICD-10-CM

## 2024-11-14 DIAGNOSIS — E1165 Type 2 diabetes mellitus with hyperglycemia: Secondary | ICD-10-CM

## 2024-11-15 MED ORDER — OZEMPIC (2 MG/DOSE) 8 MG/3ML ~~LOC~~ SOPN: 2.0000 mg | PEN_INJECTOR | SUBCUTANEOUS | 3 refills | Status: AC

## 2024-11-15 MED ORDER — ONETOUCH ULTRA 2 W/DEVICE KIT: PACK | 0 refills | Status: DC

## 2024-11-15 MED ORDER — ONETOUCH ULTRASOFT LANCETS MISC: 12 refills | Status: DC

## 2024-11-15 MED ORDER — ONETOUCH ULTRA TEST VI STRP: ORAL_STRIP | 12 refills | Status: DC

## 2024-11-17 ENCOUNTER — Other Ambulatory Visit: Payer: Self-pay | Admitting: Internal Medicine

## 2024-11-17 DIAGNOSIS — Z794 Long term (current) use of insulin: Secondary | ICD-10-CM

## 2024-11-22 ENCOUNTER — Encounter: Payer: Self-pay | Admitting: Internal Medicine

## 2024-11-22 ENCOUNTER — Ambulatory Visit: Admitting: Internal Medicine

## 2024-11-22 VITALS — BP 120/62 | HR 55 | Temp 98.7°F | Ht 66.0 in | Wt 224.0 lb

## 2024-11-22 DIAGNOSIS — I10 Essential (primary) hypertension: Secondary | ICD-10-CM

## 2024-11-22 DIAGNOSIS — E1165 Type 2 diabetes mellitus with hyperglycemia: Secondary | ICD-10-CM | POA: Diagnosis not present

## 2024-11-22 DIAGNOSIS — M7989 Other specified soft tissue disorders: Secondary | ICD-10-CM

## 2024-11-22 DIAGNOSIS — Z794 Long term (current) use of insulin: Secondary | ICD-10-CM

## 2024-11-22 DIAGNOSIS — F411 Generalized anxiety disorder: Secondary | ICD-10-CM | POA: Diagnosis not present

## 2024-11-22 DIAGNOSIS — Z23 Encounter for immunization: Secondary | ICD-10-CM

## 2024-11-22 DIAGNOSIS — Z1231 Encounter for screening mammogram for malignant neoplasm of breast: Secondary | ICD-10-CM | POA: Diagnosis not present

## 2024-11-22 LAB — POCT GLYCOSYLATED HEMOGLOBIN (HGB A1C): Hemoglobin A1C: 6.1 % — AB (ref 4.0–5.6)

## 2024-11-22 NOTE — Progress Notes (Signed)
 " [facility] [address] [place] [address] Dept: [phone] Dept Fax: [phone]    Subjective:   Tina Baker May 25, 1946 11/22/2024`  Chief Complaint  Patient presents with   Follow-up    Knot on shoulder blade, tingling in fingers,    HPI: Tina Baker presents today for re-assessment and management of chronic medical conditions.  Discussed the use of AI scribe software for clinical note transcription with the patient, who gave verbal consent to proceed.  History of Present Illness   Tina Baker is a 79 year old female with type 2 diabetes, paroxysmal atrial fibrillation, and heart failure who presents for a routine follow-up visit.  Her type 2 diabetes is managed with glipizide  10 mg BID, insulin  deglutec 13 units once daily, and Ozempic  2 mg once a week. Her A1c has improved from 9.2% in August 2024 to 6.1% currently. She experiences issues with the Freestyle glucose monitor, which often wakes her at night with low readings, leading her to rely on finger sticks instead. She has stopped using the [employer].   She has paroxysmal atrial fibrillation and heart failure, managed by cardiology. She was recently switched from Tikosyn  to Revatio (sildenafil) 20 mg BID for pulmonary function and continues on Entresto for heart failure. She uses oxygen  as needed.   She is managed by pulmonology for chronic respiratory failure, obstructive sleep apnea, and restrictive lung disease. She uses oxygen  at night and occasionally during the day, with no recent respiratory infections reported.  Her hypertension is well-controlled, with a current blood pressure of 120/62 mmHg.  She has generalized anxiety disorder and takes alprazolam  0.5 to 1 mg at bedtime as needed. It helps her sleep, although she continues to sleep in a lift chair due to knee discomfort. She recently had nerve stimulator placed for right knee  pain, managed by orthopedics.   She reports a knot on her back present for a few months. The knot is slightly painful when pressed. She reports waking up some nights with numbness in right hand , she is unsure if the knot is related to this or not.    Lab Results  Component Value Date   HGBA1C 6.1 (A) 11/22/2024   HGBA1C 7.0 (A) 06/12/2024   HGBA1C 8.4 (A) 02/08/2024   Last CMP:  10/24/2024 : GFR 83, Bun 18, Creatinine 0.74   The following portions of the patient's history were reviewed and updated as appropriate: past medical history, past surgical history, family history, social history, allergies, medications, and problem list.   Patient Active Problem List   Diagnosis Date Noted   Impaired functional mobility, balance, gait, and endurance 03/31/2023   Irritant contact dermatitis 03/31/2023   Arthralgia of right wrist 03/31/2023   Lumbar radiculopathy 02/22/2023   Acute idiopathic gout of right foot 02/02/2023   Chronic bilateral low back pain without sciatica 01/05/2023   Bilateral hip pain 01/05/2023   Impaired ambulation 01/05/2023   Weakness of both lower extremities 01/05/2023   Tinea unguium 12/01/2022   Heart failure (HCC) 04/09/2022   Acute on chronic heart failure with preserved ejection fraction (HFpEF) (HCC) 04/01/2022   Cryptogenic cirrhosis (HCC) 02/19/2022   Hypoglycemia 11/13/2021   Chronic respiratory failure with hypoxia (HCC) 11/12/2021   CAP (community acquired pneumonia) 11/12/2021   Subtherapeutic anticoagulation 11/12/2021   TIA (transient ischemic attack) 11/07/2021   Aspiration pneumonia (HCC) 09/17/2021   RSV (respiratory syncytial virus infection) 09/17/2021   Senile nuclear sclerosis  03/18/2021   Hypertrophy of bone, right ankle and foot 01/24/2021   Restrictive lung disease 05/17/2020   History of colon polyps 12/11/2019   Hyperbilirubinemia 12/11/2019   Thrombocytopenia 12/11/2019   Pulmonary hypertension (HCC) 11/24/2019   Bilateral  pleural effusion 10/25/2019   Primary osteoarthritis of left hip 06/14/2019   Trochanteric bursitis of left hip 06/14/2019   Intolerance of continuous positive airway pressure (CPAP) ventilation 05/03/2019   Primary osteoarthritis of left knee 05/01/2019   Primary osteoarthritis of right knee 05/01/2019   Bilateral carpal tunnel syndrome 05/01/2019   Trigger thumb of left hand 05/01/2019   ANA positive 02/23/2019   Rheumatoid factor positive 02/23/2019   GI bleed 02/17/2019   Generalized anxiety disorder 11/27/2018   DNR (do not resuscitate) 11/27/2018   Elevated d-dimer 11/27/2018   Hyponatremia 11/27/2018   Major depressive disorder with current active episode 11/27/2018   Obesity (BMI 30-39.9) 11/27/2018   Essential hypertension 11/27/2018   Acute hypoxemic respiratory failure (HCC) 11/27/2018   Obstructive sleep apnea (adult) (pediatric) 08/12/2018   Acute pulmonary edema (HCC) 07/08/2018   Recurrent major depressive disorder, in partial remission 04/13/2018   Varicose veins of left lower extremity with pain 04/13/2018   Constipation 02/19/2018   Long term (current) use of anticoagulants 12/31/2015   Acromioclavicular joint arthritis 12/31/2015   Subacromial bursitis 12/31/2015   Allergic rhinitis 11/13/2015   Benign essential hypertension 11/13/2015   Chronic tension-type headache, not intractable 11/13/2015   DDD (degenerative disc disease), lumbosacral 11/13/2015   Gastroesophageal reflux disease without esophagitis 11/13/2015   Paroxysmal atrial fibrillation (HCC) 11/13/2015   RLS (restless legs syndrome) 11/13/2015   Vitamin D deficiency 11/13/2015   Insomnia 11/13/2015   Class 1 obesity due to excess calories with serious comorbidity and body mass index (BMI) of 33.0 to 33.9 in adult 11/13/2015   Contracture of tendon sheath 11/13/2015   Spinal stenosis of lumbar region without neurogenic claudication 11/13/2015   Gout, arthritis 11/13/2015   Iron deficiency anemia  due to chronic blood loss 11/13/2015   Lumbar herniated disc 11/13/2015   Rotator cuff tendinitis 11/13/2015   Swelling 09/30/2015   Hospital discharge follow-up 07/04/2015   Leukocytosis 06/27/2015   Anemia, unspecified 06/26/2015   Type 2 diabetes mellitus with hyperglycemia, with long-term current use of insulin  (HCC) 06/25/2015   Venous insufficiency, peripheral 01/09/2015   Spondylolisthesis of lumbar region 11/03/2012   Past Medical History:  Diagnosis Date   Arthritis    Atrial fibrillation (HCC)    Diabetes mellitus    Hyperlipidemia    Hypertension    Past Surgical History:  Procedure Laterality Date   ABDOMINAL HYSTERECTOMY  2001   BUNIONECTOMY Bilateral 1979   GALLBLADDER SURGERY     LEG SURGERY Right 09/24/2023   SPINE SURGERY  2010   protruding disc    Family History  Problem Relation Age of Onset   Cancer Mother 59       leukemia    Heart disease Father 26       heart attack    Heart disease Sister    Current Medications[1] Allergies[2]   ROS: A complete ROS was performed with pertinent positives/negatives noted in the HPI. The remainder of the ROS are negative.    Objective:   Today's Vitals   11/22/24 1427  BP: 120/62  Pulse: (!) 55  Temp: 98.7 F (37.1 C)  TempSrc: Temporal  SpO2: 99%  Weight: 224 lb (101.6 kg)  Height: 5' 6 (1.676 m)    GENERAL:  Well-appearing, in NAD. Well nourished.  SKIN: Pink, warm and dry. No rash, lesion, ulceration, or ecchymoses. Palpable 2cm moveable mass to right upper back  NECK: Trachea midline. Full ROM w/o pain or tenderness. No lymphadenopathy.  RESPIRATORY: Chest wall symmetrical. Respirations even and non-labored. Breath sounds clear to auscultation bilaterally.  CARDIAC: S1, S2 present, regular rate and rhythm. Peripheral pulses 2+ bilaterally.  MSK: Muscle tone and strength appropriate for age. EXTREMITIES: Without clubbing, cyanosis, or edema.  PSYCH/MENTAL STATUS: Alert, oriented x 3. Cooperative,  appropriate mood and affect.   Health Maintenance Due  Topic Date Due   Mammogram  09/15/2024   Diabetic kidney evaluation - eGFR measurement  12/01/2024    Results for orders placed or performed in visit on 11/22/24  POCT glycosylated hemoglobin (Hb A1C)  Result Value Ref Range   Hemoglobin A1C 6.1 (A) 4.0 - 5.6 %   HbA1c POC (<> result, manual entry)     HbA1c, POC (prediabetic range)     HbA1c, POC (controlled diabetic range)      The ASCVD Risk score (Arnett DK, et al., 2019) failed to calculate for the following reasons:   Risk score cannot be calculated because patient has a medical history suggesting prior/existing ASCVD   * - Cholesterol units were assumed     Assessment & Plan:  Assessment and Plan    Type 2 diabetes mellitus Well-controlled with A1c of 6.1%. No recent hypoglycemic episodes. Freestyle Libre discontinued due to inaccurate nighttime readings. - Continue glipizide  10 mg BID, insulin  deglutec 13 units once daily, Ozempic  2 mg once a week. - Continue using finger sticks for blood glucose monitoring. - Recent CMP stable.  - Will do urine albumin at next visit  - Patient is not fasting today for lipid panel. She will fast for next visit to collect lipid panel   Essential hypertension Well-controlled with blood pressure of 120/62 mmHg.  Generalized anxiety disorder Managed with alprazolam , effective at current dosage. - Continue alprazolam  0.5 to 1 mg at bedtime as needed.  Screening mammogram for breast cancer Due for screening mammogram, prefers [facility]. - Ordered mammogram and faxed order to [facility].  Soft tissue mass (possible cyst) - Continue to monitor the back mass for changes in size or symptoms.  Influenza vaccination Interested in vaccination due to current flu season. - Administered influenza vaccination.       Orders Placed This Encounter  Procedures   MM 3D SCREENING MAMMOGRAM BILATERAL BREAST    Standing  Status:   Future    Expiration Date:   11/22/2025    Scheduling Instructions:     [facility]      [facility] [place]    Reason for Exam (SYMPTOM  OR DIAGNOSIS REQUIRED):   screening for breast cancer    Preferred imaging location?:   External   Flu vaccine HIGH DOSE PF(Fluzone Trivalent)   POCT glycosylated hemoglobin (Hb A1C)   No images are attached to the encounter or orders placed in the encounter. No orders of the defined types were placed in this encounter.   Return in about 4 months (around 03/22/2025) for Chronic Condition follow up - fasting lab work .   Rosina Senters, FNP     [1]  Current Outpatient Medications:    acetaminophen  (TYLENOL ) 325 MG tablet, Take by mouth. (Patient taking differently: Take by mouth as needed.), Disp: , Rfl:    ALPRAZolam  (XANAX ) 0.5 MG tablet, TAKE 1 TO 2 TABLETS BY MOUTH AT BEDTIME  AS NEEDED, Disp: 60 tablet, Rfl: 1   Blood Glucose Monitoring Suppl (ONE TOUCH ULTRA 2) w/Device KIT, Use to check blood sugar 1st thing in the morning before eating and again 2 hours after largest meal of the day.  Okay to change to brand preferred on insurance.  Type 2 Diabetes E11.65, Disp: 1 kit, Rfl: 0   Continuous Glucose Sensor (FREESTYLE LIBRE 3 PLUS SENSOR) MISC, , Disp: , Rfl:    diltiazem (CARDIZEM CD) 360 MG 24 hr capsule, , Disp: , Rfl:    ELIQUIS 5 MG TABS tablet, Take 5 mg by mouth 2 (two) times daily., Disp: , Rfl:    EMBECTA PEN NEEDLE NANO 2 GEN 32G X 4 MM MISC, USE AS DIRECTED TO INJECT INSULIN  ONCE DAILY, Disp: 100 each, Rfl: 0   furosemide  (LASIX ) 40 MG tablet, Take 40 mg by mouth daily. Take an extra tab in the afternoon if you notice increasing leg swelling or your weight goes up by more than 2 pounds in a day or 5 pounds in a week., Disp: , Rfl:    glipiZIDE  (GLUCOTROL ) 10 MG tablet, TAKE 1 TABLET BY MOUTH TWICE DAILY BEFORE A MEAL, Disp: 180 tablet, Rfl: 3   glucose blood test strip, Use to check blood sugar 1st thing in the morning before  eating and again 2 hours after largest meal of the day.  Okay to change to brand preferred on insurance.  Type 2 Diabetes E11.65, Disp: 100 each, Rfl: 12   Insulin  Degludec FlexTouch 100 UNIT/ML SOPN, Inject 13 Units into the skin daily., Disp: 15 mL, Rfl: 1   lansoprazole  (PREVACID ) 30 MG capsule, Take 1 capsule by mouth once daily, Disp: 90 capsule, Rfl: 0   metoprolol succinate (TOPROL-XL) 50 MG 24 hr tablet, Take 1 tablet by mouth daily., Disp: , Rfl:    ondansetron  (ZOFRAN -ODT) 4 MG disintegrating tablet, Take 1 tablet (4 mg total) by mouth every 8 (eight) hours as needed for nausea or vomiting. Place 1 tablet under your tongue every 8 hours as needed, Disp: 20 tablet, Rfl: 0   pramipexole (MIRAPEX) 0.5 MG tablet, Take 0.5 mg by mouth 3 (three) times daily., Disp: , Rfl:    sacubitril-valsartan (ENTRESTO) 24-26 MG, Take 1 tablet by mouth 2 (two) times daily., Disp: , Rfl:    Semaglutide , 2 MG/DOSE, (OZEMPIC , 2 MG/DOSE,) 8 MG/3ML SOPN, Inject 2 mg into the skin once a week., Disp: 9 mL, Rfl: 3   sildenafil (REVATIO) 20 MG tablet, Take 20 mg by mouth 2 (two) times daily., Disp: , Rfl:    aspirin  81 MG tablet, Take 81 mg by mouth daily., Disp: , Rfl:    cholecalciferol (VITAMIN D3) 25 MCG (1000 UT) tablet, Take 2,000 Units by mouth daily., Disp: , Rfl:    OXYGEN , POC at  3 L via nasal cannula (stationary concentrator) with activity and 2 liters at night .   LON: 99 months See attached testing. DME provider: (Patient not taking: Reported on 09/05/2024), Disp: , Rfl:  [2]  Allergies Allergen Reactions   Statins Other (See Comments)    myalgia   Valdecoxib Rash        Other Other (See Comments)   Zolpidem Other (See Comments)   "

## 2024-11-24 ENCOUNTER — Telehealth: Payer: Self-pay

## 2024-11-24 NOTE — Progress Notes (Signed)
" ° °  11/24/2024  Patient ID: [name], female   DOB: Jul 01, 1946, 79 y.o.   MRN: [id number]  Missed call/voicemail from patient stating pharmacy did not receive lancets or meter, only prescription for test strips.  Contacted the pharmacy, and they are currently filling an Accu Chek meter; test strips for this meter were already picked up.  They state the never received an order for lancets, but these were sent the same time as the meter and strips- sending another order for Accu Chek Softclix lancets to go with lancing device that comes with meter they are filling.  I also requested that they fill the Ozempic  2mg  sent in recently; this is going through for $0.  Contacted patient to let her know prescriptions should be ready later today or tomorrow.  [name], PharmD, DPLA  "

## 2024-11-26 ENCOUNTER — Other Ambulatory Visit: Payer: Self-pay | Admitting: Internal Medicine

## 2024-11-26 DIAGNOSIS — E1165 Type 2 diabetes mellitus with hyperglycemia: Secondary | ICD-10-CM

## 2024-11-26 MED ORDER — ACCU-CHEK SOFTCLIX LANCETS MISC: 12 refills | Status: AC

## 2024-12-13 ENCOUNTER — Ambulatory Visit: Admitting: Podiatry

## 2025-01-16 ENCOUNTER — Other Ambulatory Visit

## 2025-03-22 ENCOUNTER — Ambulatory Visit: Admitting: Internal Medicine

## 2025-07-13 ENCOUNTER — Ambulatory Visit
